# Patient Record
Sex: Female | Born: 1935 | Race: Black or African American | Hispanic: No | Smoking: Never smoker
Health system: Southern US, Community
[De-identification: ages and names within clinical notes are randomized; demographics above are authoritative.]

## PROBLEM LIST (undated history)

## (undated) DIAGNOSIS — R413 Other amnesia: Secondary | ICD-10-CM

## (undated) DIAGNOSIS — I5030 Unspecified diastolic (congestive) heart failure: Secondary | ICD-10-CM

## (undated) DIAGNOSIS — D509 Iron deficiency anemia, unspecified: Secondary | ICD-10-CM

## (undated) DIAGNOSIS — I739 Peripheral vascular disease, unspecified: Secondary | ICD-10-CM

## (undated) DIAGNOSIS — E785 Hyperlipidemia, unspecified: Secondary | ICD-10-CM

## (undated) DIAGNOSIS — I10 Essential (primary) hypertension: Secondary | ICD-10-CM

## (undated) DIAGNOSIS — I482 Chronic atrial fibrillation, unspecified: Secondary | ICD-10-CM

## (undated) DIAGNOSIS — I639 Cerebral infarction, unspecified: Secondary | ICD-10-CM

## (undated) DIAGNOSIS — E669 Obesity, unspecified: Secondary | ICD-10-CM

## (undated) DIAGNOSIS — I2510 Atherosclerotic heart disease of native coronary artery without angina pectoris: Secondary | ICD-10-CM

## (undated) HISTORY — DX: Unspecified diastolic (congestive) heart failure: I50.30

## (undated) HISTORY — PX: EMBOLECTOMY: SHX44

## (undated) HISTORY — DX: Iron deficiency anemia, unspecified: D50.9

## (undated) HISTORY — DX: Essential (primary) hypertension: I10

## (undated) HISTORY — DX: Cerebral infarction, unspecified: I63.9

## (undated) HISTORY — DX: Other amnesia: R41.3

## (undated) HISTORY — DX: Hyperlipidemia, unspecified: E78.5

## (undated) HISTORY — DX: Obesity, unspecified: E66.9

---

## 1998-12-11 ENCOUNTER — Encounter: Payer: Self-pay | Admitting: Emergency Medicine

## 1998-12-11 ENCOUNTER — Emergency Department (HOSPITAL_COMMUNITY): Admission: EM | Admit: 1998-12-11 | Discharge: 1998-12-11 | Payer: Self-pay | Admitting: Emergency Medicine

## 1999-04-11 ENCOUNTER — Emergency Department (HOSPITAL_COMMUNITY): Admission: EM | Admit: 1999-04-11 | Discharge: 1999-04-11 | Payer: Self-pay | Admitting: Emergency Medicine

## 1999-04-16 ENCOUNTER — Encounter: Admission: RE | Admit: 1999-04-16 | Discharge: 1999-04-16 | Payer: Self-pay | Admitting: Internal Medicine

## 1999-05-01 ENCOUNTER — Encounter: Admission: RE | Admit: 1999-05-01 | Discharge: 1999-05-01 | Payer: Self-pay | Admitting: Hematology and Oncology

## 1999-06-13 ENCOUNTER — Encounter: Admission: RE | Admit: 1999-06-13 | Discharge: 1999-06-13 | Payer: Self-pay | Admitting: Internal Medicine

## 1999-12-21 ENCOUNTER — Emergency Department (HOSPITAL_COMMUNITY): Admission: EM | Admit: 1999-12-21 | Discharge: 1999-12-21 | Payer: Self-pay | Admitting: Emergency Medicine

## 1999-12-21 ENCOUNTER — Encounter: Payer: Self-pay | Admitting: Emergency Medicine

## 2000-04-11 ENCOUNTER — Encounter: Payer: Self-pay | Admitting: Emergency Medicine

## 2000-04-11 ENCOUNTER — Emergency Department (HOSPITAL_COMMUNITY): Admission: EM | Admit: 2000-04-11 | Discharge: 2000-04-11 | Payer: Self-pay | Admitting: Emergency Medicine

## 2000-04-23 ENCOUNTER — Encounter: Admission: RE | Admit: 2000-04-23 | Discharge: 2000-04-23 | Payer: Self-pay | Admitting: Hematology and Oncology

## 2000-05-28 ENCOUNTER — Encounter: Admission: RE | Admit: 2000-05-28 | Discharge: 2000-05-28 | Payer: Self-pay | Admitting: Hematology and Oncology

## 2000-06-11 ENCOUNTER — Encounter: Admission: RE | Admit: 2000-06-11 | Discharge: 2000-06-11 | Payer: Self-pay | Admitting: Hematology and Oncology

## 2000-07-09 ENCOUNTER — Encounter: Admission: RE | Admit: 2000-07-09 | Discharge: 2000-07-09 | Payer: Self-pay | Admitting: Hematology and Oncology

## 2000-07-16 ENCOUNTER — Encounter: Admission: RE | Admit: 2000-07-16 | Discharge: 2000-07-16 | Payer: Self-pay | Admitting: Hematology and Oncology

## 2000-07-30 ENCOUNTER — Encounter: Admission: RE | Admit: 2000-07-30 | Discharge: 2000-07-30 | Payer: Self-pay | Admitting: Internal Medicine

## 2005-08-23 ENCOUNTER — Ambulatory Visit: Payer: Self-pay | Admitting: Cardiology

## 2005-08-23 ENCOUNTER — Inpatient Hospital Stay (HOSPITAL_COMMUNITY): Admission: EM | Admit: 2005-08-23 | Discharge: 2005-09-03 | Payer: Self-pay | Admitting: Emergency Medicine

## 2005-08-23 ENCOUNTER — Encounter: Payer: Self-pay | Admitting: Internal Medicine

## 2005-10-22 ENCOUNTER — Inpatient Hospital Stay (HOSPITAL_COMMUNITY): Admission: EM | Admit: 2005-10-22 | Discharge: 2005-10-24 | Payer: Self-pay | Admitting: Emergency Medicine

## 2005-10-22 ENCOUNTER — Ambulatory Visit: Payer: Self-pay | Admitting: Family Medicine

## 2005-10-25 ENCOUNTER — Ambulatory Visit: Payer: Self-pay | Admitting: Family Medicine

## 2005-11-08 ENCOUNTER — Ambulatory Visit: Payer: Self-pay | Admitting: Family Medicine

## 2005-11-15 ENCOUNTER — Ambulatory Visit: Payer: Self-pay | Admitting: Family Medicine

## 2005-12-02 ENCOUNTER — Ambulatory Visit: Payer: Self-pay | Admitting: Family Medicine

## 2005-12-12 ENCOUNTER — Ambulatory Visit: Payer: Self-pay | Admitting: Family Medicine

## 2005-12-19 ENCOUNTER — Ambulatory Visit: Payer: Self-pay | Admitting: Family Medicine

## 2006-04-10 ENCOUNTER — Ambulatory Visit: Payer: Self-pay | Admitting: Family Medicine

## 2006-05-06 ENCOUNTER — Encounter (INDEPENDENT_AMBULATORY_CARE_PROVIDER_SITE_OTHER): Payer: Self-pay | Admitting: Cardiology

## 2006-05-06 ENCOUNTER — Inpatient Hospital Stay (HOSPITAL_COMMUNITY): Admission: EM | Admit: 2006-05-06 | Discharge: 2006-05-13 | Payer: Self-pay | Admitting: Emergency Medicine

## 2006-05-06 ENCOUNTER — Encounter (INDEPENDENT_AMBULATORY_CARE_PROVIDER_SITE_OTHER): Payer: Self-pay | Admitting: *Deleted

## 2006-07-11 ENCOUNTER — Ambulatory Visit: Payer: Self-pay | Admitting: Family Medicine

## 2006-08-04 ENCOUNTER — Ambulatory Visit: Payer: Self-pay | Admitting: Family Medicine

## 2006-09-04 ENCOUNTER — Ambulatory Visit: Payer: Self-pay | Admitting: Family Medicine

## 2006-10-20 ENCOUNTER — Ambulatory Visit: Payer: Self-pay | Admitting: Internal Medicine

## 2006-11-03 ENCOUNTER — Ambulatory Visit: Payer: Self-pay | Admitting: Family Medicine

## 2006-12-01 ENCOUNTER — Ambulatory Visit: Payer: Self-pay | Admitting: Family Medicine

## 2006-12-09 ENCOUNTER — Ambulatory Visit: Payer: Self-pay | Admitting: Family Medicine

## 2006-12-23 ENCOUNTER — Ambulatory Visit: Payer: Self-pay | Admitting: Family Medicine

## 2007-01-20 ENCOUNTER — Ambulatory Visit: Payer: Self-pay | Admitting: Family Medicine

## 2007-02-19 ENCOUNTER — Ambulatory Visit: Payer: Self-pay | Admitting: Internal Medicine

## 2007-03-23 ENCOUNTER — Ambulatory Visit: Payer: Self-pay | Admitting: Family Medicine

## 2007-04-20 ENCOUNTER — Ambulatory Visit: Payer: Self-pay | Admitting: Family Medicine

## 2007-05-18 ENCOUNTER — Ambulatory Visit: Payer: Self-pay | Admitting: Internal Medicine

## 2007-06-02 DIAGNOSIS — I10 Essential (primary) hypertension: Secondary | ICD-10-CM

## 2007-06-15 ENCOUNTER — Ambulatory Visit: Payer: Self-pay | Admitting: Internal Medicine

## 2007-06-29 ENCOUNTER — Ambulatory Visit: Payer: Self-pay | Admitting: Internal Medicine

## 2007-07-13 ENCOUNTER — Ambulatory Visit: Payer: Self-pay | Admitting: Family Medicine

## 2007-08-10 ENCOUNTER — Ambulatory Visit: Payer: Self-pay | Admitting: Nurse Practitioner

## 2007-09-07 ENCOUNTER — Ambulatory Visit: Payer: Self-pay | Admitting: Nurse Practitioner

## 2007-10-06 ENCOUNTER — Ambulatory Visit: Payer: Self-pay | Admitting: Internal Medicine

## 2007-11-03 ENCOUNTER — Ambulatory Visit: Payer: Self-pay | Admitting: Internal Medicine

## 2007-11-17 ENCOUNTER — Ambulatory Visit: Payer: Self-pay | Admitting: Internal Medicine

## 2007-12-15 ENCOUNTER — Ambulatory Visit: Payer: Self-pay | Admitting: Family Medicine

## 2008-01-13 ENCOUNTER — Ambulatory Visit: Payer: Self-pay | Admitting: Family Medicine

## 2008-01-13 ENCOUNTER — Ambulatory Visit: Payer: Self-pay | Admitting: Internal Medicine

## 2008-02-10 ENCOUNTER — Ambulatory Visit: Payer: Self-pay | Admitting: Internal Medicine

## 2008-02-19 ENCOUNTER — Ambulatory Visit: Payer: Self-pay | Admitting: Internal Medicine

## 2008-03-10 ENCOUNTER — Ambulatory Visit: Payer: Self-pay | Admitting: Internal Medicine

## 2008-04-07 ENCOUNTER — Ambulatory Visit: Payer: Self-pay | Admitting: Family Medicine

## 2008-04-07 LAB — CONVERTED CEMR LAB
INR: 2.2 — ABNORMAL HIGH (ref 0.0–1.5)
Prothrombin Time: 25.8 s — ABNORMAL HIGH (ref 11.6–15.2)

## 2008-05-05 ENCOUNTER — Ambulatory Visit: Payer: Self-pay | Admitting: Internal Medicine

## 2008-05-05 LAB — CONVERTED CEMR LAB
INR: 2.4 — ABNORMAL HIGH (ref 0.0–1.5)
Prothrombin Time: 27.4 s — ABNORMAL HIGH (ref 11.6–15.2)

## 2008-06-02 ENCOUNTER — Ambulatory Visit: Payer: Self-pay | Admitting: Family Medicine

## 2008-06-02 LAB — CONVERTED CEMR LAB
INR: 2 — ABNORMAL HIGH (ref 0.0–1.5)
Prothrombin Time: 24.1 s — ABNORMAL HIGH (ref 11.6–15.2)

## 2008-06-30 ENCOUNTER — Encounter (INDEPENDENT_AMBULATORY_CARE_PROVIDER_SITE_OTHER): Payer: Self-pay | Admitting: Family Medicine

## 2008-06-30 ENCOUNTER — Ambulatory Visit: Payer: Self-pay

## 2008-06-30 ENCOUNTER — Ambulatory Visit: Payer: Self-pay | Admitting: Family Medicine

## 2008-06-30 LAB — CONVERTED CEMR LAB
INR: 2.2 — ABNORMAL HIGH (ref 0.0–1.5)
Prothrombin Time: 25.6 s — ABNORMAL HIGH (ref 11.6–15.2)

## 2008-07-28 ENCOUNTER — Ambulatory Visit: Payer: Self-pay | Admitting: Family Medicine

## 2008-07-28 ENCOUNTER — Encounter (INDEPENDENT_AMBULATORY_CARE_PROVIDER_SITE_OTHER): Payer: Self-pay | Admitting: Adult Health

## 2008-07-28 ENCOUNTER — Emergency Department (HOSPITAL_COMMUNITY): Admission: EM | Admit: 2008-07-28 | Discharge: 2008-07-28 | Payer: Self-pay | Admitting: Emergency Medicine

## 2008-07-28 LAB — CONVERTED CEMR LAB
INR: 2.6 — ABNORMAL HIGH (ref 0.0–1.5)
Prothrombin Time: 30.1 s — ABNORMAL HIGH (ref 11.6–15.2)

## 2008-08-30 ENCOUNTER — Ambulatory Visit: Payer: Self-pay | Admitting: Internal Medicine

## 2008-08-30 ENCOUNTER — Encounter: Payer: Self-pay | Admitting: Cardiology

## 2008-08-30 ENCOUNTER — Encounter (INDEPENDENT_AMBULATORY_CARE_PROVIDER_SITE_OTHER): Payer: Self-pay | Admitting: Adult Health

## 2008-08-30 LAB — CONVERTED CEMR LAB
ALT: 16 units/L (ref 0–35)
AST: 22 units/L (ref 0–37)
Albumin: 3.9 g/dL (ref 3.5–5.2)
Alkaline Phosphatase: 62 units/L (ref 39–117)
BUN: 15 mg/dL (ref 6–23)
Basophils Absolute: 0 10*3/uL (ref 0.0–0.1)
Basophils Relative: 1 % (ref 0–1)
CO2: 26 meq/L (ref 19–32)
Calcium: 9 mg/dL (ref 8.4–10.5)
Chloride: 102 meq/L (ref 96–112)
Cholesterol: 207 mg/dL — ABNORMAL HIGH (ref 0–200)
Creatinine, Ser: 1.08 mg/dL (ref 0.40–1.20)
Eosinophils Absolute: 0.1 10*3/uL (ref 0.0–0.7)
Eosinophils Relative: 1 % (ref 0–5)
Glucose, Bld: 89 mg/dL (ref 70–99)
HCT: 44.9 % (ref 36.0–46.0)
HDL: 46 mg/dL (ref >39)
Hemoglobin: 14.2 g/dL (ref 12.0–15.0)
INR: 3.5 — ABNORMAL HIGH (ref 0.0–1.5)
LDL Cholesterol: 142 mg/dL — ABNORMAL HIGH (ref 0–99)
Lymphocytes Relative: 54 % — ABNORMAL HIGH (ref 12–46)
Lymphs Abs: 2.6 10*3/uL (ref 0.7–4.0)
MCHC: 31.6 g/dL (ref 30.0–36.0)
MCV: 91.1 fL (ref 78.0–100.0)
Monocytes Absolute: 0.3 10*3/uL (ref 0.1–1.0)
Monocytes Relative: 6 % (ref 3–12)
Neutro Abs: 1.8 10*3/uL (ref 1.7–7.7)
Neutrophils Relative %: 38 % — ABNORMAL LOW (ref 43–77)
Platelets: 243 10*3/uL (ref 150–400)
Potassium: 3.6 meq/L (ref 3.5–5.3)
Pro B Natriuretic peptide (BNP): 275.3 pg/mL — ABNORMAL HIGH (ref 0.0–100.0)
Prothrombin Time: 38.1 s — ABNORMAL HIGH (ref 11.6–15.2)
RBC: 4.93 M/uL (ref 3.87–5.11)
RDW: 13 % (ref 11.5–15.5)
Sodium: 140 meq/L (ref 135–145)
TSH: 0.603 microintl units/mL (ref 0.350–4.50)
Total Bilirubin: 0.6 mg/dL (ref 0.3–1.2)
Total CHOL/HDL Ratio: 4.5
Total Protein: 7.5 g/dL (ref 6.0–8.3)
Triglycerides: 96 mg/dL (ref <150)
VLDL: 19 mg/dL (ref 0–40)
WBC: 4.7 10*3/uL (ref 4.0–10.5)

## 2008-09-22 ENCOUNTER — Ambulatory Visit: Payer: Self-pay | Admitting: Adult Health

## 2008-10-10 ENCOUNTER — Emergency Department (HOSPITAL_COMMUNITY): Admission: EM | Admit: 2008-10-10 | Discharge: 2008-10-10 | Payer: Self-pay | Admitting: Emergency Medicine

## 2008-10-10 ENCOUNTER — Ambulatory Visit: Payer: Self-pay | Admitting: Internal Medicine

## 2008-10-20 ENCOUNTER — Ambulatory Visit: Payer: Self-pay | Admitting: Internal Medicine

## 2008-11-03 ENCOUNTER — Ambulatory Visit: Payer: Self-pay | Admitting: Adult Health

## 2008-11-17 ENCOUNTER — Ambulatory Visit: Payer: Self-pay | Admitting: Adult Health

## 2008-12-15 ENCOUNTER — Ambulatory Visit: Payer: Self-pay | Admitting: Adult Health

## 2008-12-20 ENCOUNTER — Encounter (INDEPENDENT_AMBULATORY_CARE_PROVIDER_SITE_OTHER): Payer: Self-pay | Admitting: Adult Health

## 2008-12-20 ENCOUNTER — Ambulatory Visit: Payer: Self-pay | Admitting: Family Medicine

## 2008-12-20 LAB — CONVERTED CEMR LAB
ALT: 13 units/L (ref 0–35)
AST: 20 units/L (ref 0–37)
Albumin: 4.1 g/dL (ref 3.5–5.2)
Alkaline Phosphatase: 72 units/L (ref 39–117)
BUN: 29 mg/dL — ABNORMAL HIGH (ref 6–23)
CO2: 25 meq/L (ref 19–32)
Calcium: 10 mg/dL (ref 8.4–10.5)
Chloride: 100 meq/L (ref 96–112)
Cholesterol: 262 mg/dL — ABNORMAL HIGH (ref 0–200)
Creatinine, Ser: 1.19 mg/dL (ref 0.40–1.20)
Glucose, Bld: 88 mg/dL (ref 70–99)
HDL: 51 mg/dL (ref >39)
LDL Cholesterol: 193 mg/dL — ABNORMAL HIGH (ref 0–99)
Potassium: 3.6 meq/L (ref 3.5–5.3)
Pro B Natriuretic peptide (BNP): 220.4 pg/mL — ABNORMAL HIGH (ref 0.0–100.0)
Sodium: 139 meq/L (ref 135–145)
Total Bilirubin: 0.7 mg/dL (ref 0.3–1.2)
Total CHOL/HDL Ratio: 5.1
Total Protein: 8.7 g/dL — ABNORMAL HIGH (ref 6.0–8.3)
Triglycerides: 90 mg/dL (ref <150)
VLDL: 18 mg/dL (ref 0–40)

## 2009-01-12 ENCOUNTER — Ambulatory Visit: Payer: Self-pay | Admitting: Adult Health

## 2009-02-09 ENCOUNTER — Ambulatory Visit: Payer: Self-pay | Admitting: Adult Health

## 2009-03-08 ENCOUNTER — Ambulatory Visit: Payer: Self-pay | Admitting: Family Medicine

## 2009-04-05 ENCOUNTER — Ambulatory Visit: Payer: Self-pay | Admitting: Internal Medicine

## 2009-04-17 ENCOUNTER — Ambulatory Visit: Payer: Self-pay | Admitting: Internal Medicine

## 2009-05-03 ENCOUNTER — Ambulatory Visit: Payer: Self-pay | Admitting: Adult Health

## 2009-05-03 ENCOUNTER — Ambulatory Visit: Payer: Self-pay | Admitting: Family Medicine

## 2009-05-17 ENCOUNTER — Encounter (INDEPENDENT_AMBULATORY_CARE_PROVIDER_SITE_OTHER): Payer: Self-pay | Admitting: Adult Health

## 2009-05-17 ENCOUNTER — Ambulatory Visit: Payer: Self-pay | Admitting: Internal Medicine

## 2009-05-17 LAB — CONVERTED CEMR LAB
ALT: 18 units/L (ref 0–35)
AST: 19 units/L (ref 0–37)
Albumin: 3.6 g/dL (ref 3.5–5.2)
Alkaline Phosphatase: 72 units/L (ref 39–117)
BUN: 17 mg/dL (ref 6–23)
CO2: 25 meq/L (ref 19–32)
Calcium: 9.2 mg/dL (ref 8.4–10.5)
Chloride: 105 meq/L (ref 96–112)
Cholesterol: 228 mg/dL — ABNORMAL HIGH (ref 0–200)
Creatinine, Ser: 0.97 mg/dL (ref 0.40–1.20)
Glucose, Bld: 83 mg/dL (ref 70–99)
HDL: 48 mg/dL (ref >39)
LDL Cholesterol: 165 mg/dL — ABNORMAL HIGH (ref 0–99)
Microalb, Ur: 21.49 mg/dL — ABNORMAL HIGH (ref 0.00–1.89)
Potassium: 4 meq/L (ref 3.5–5.3)
Pro B Natriuretic peptide (BNP): 210.8 pg/mL — ABNORMAL HIGH (ref 0.0–100.0)
Sodium: 141 meq/L (ref 135–145)
Total Bilirubin: 0.7 mg/dL (ref 0.3–1.2)
Total CHOL/HDL Ratio: 4.8
Total Protein: 7.6 g/dL (ref 6.0–8.3)
Triglycerides: 77 mg/dL (ref <150)
VLDL: 15 mg/dL (ref 0–40)

## 2009-05-18 ENCOUNTER — Encounter: Payer: Self-pay | Admitting: Cardiology

## 2009-05-31 ENCOUNTER — Ambulatory Visit: Payer: Self-pay | Admitting: Family Medicine

## 2009-06-16 ENCOUNTER — Ambulatory Visit: Payer: Self-pay | Admitting: Internal Medicine

## 2009-06-19 DIAGNOSIS — D649 Anemia, unspecified: Secondary | ICD-10-CM | POA: Insufficient documentation

## 2009-06-28 ENCOUNTER — Ambulatory Visit: Payer: Self-pay | Admitting: Family Medicine

## 2009-06-28 ENCOUNTER — Ambulatory Visit: Payer: Self-pay | Admitting: Internal Medicine

## 2009-07-26 ENCOUNTER — Ambulatory Visit: Payer: Self-pay | Admitting: Adult Health

## 2009-08-16 ENCOUNTER — Ambulatory Visit: Payer: Self-pay | Admitting: Adult Health

## 2009-09-13 ENCOUNTER — Ambulatory Visit: Payer: Self-pay | Admitting: Adult Health

## 2009-10-12 ENCOUNTER — Ambulatory Visit: Payer: Self-pay | Admitting: Adult Health

## 2009-11-01 ENCOUNTER — Ambulatory Visit: Payer: Self-pay | Admitting: Internal Medicine

## 2009-11-01 ENCOUNTER — Encounter (INDEPENDENT_AMBULATORY_CARE_PROVIDER_SITE_OTHER): Payer: Self-pay | Admitting: Adult Health

## 2009-11-01 LAB — CONVERTED CEMR LAB
ALT: 11 units/L (ref 0–35)
AST: 20 units/L (ref 0–37)
Albumin: 3.7 g/dL (ref 3.5–5.2)
Alkaline Phosphatase: 92 units/L (ref 39–117)
BUN: 19 mg/dL (ref 6–23)
CO2: 25 meq/L (ref 19–32)
Calcium: 9.9 mg/dL (ref 8.4–10.5)
Chloride: 101 meq/L (ref 96–112)
Cholesterol: 163 mg/dL (ref 0–200)
Creatinine, Ser: 1.09 mg/dL (ref 0.40–1.20)
Glucose, Bld: 84 mg/dL (ref 70–99)
HDL: 49 mg/dL (ref >39)
LDL Cholesterol: 99 mg/dL (ref 0–99)
Potassium: 4 meq/L (ref 3.5–5.3)
Sodium: 141 meq/L (ref 135–145)
Total Bilirubin: 0.8 mg/dL (ref 0.3–1.2)
Total CHOL/HDL Ratio: 3.3
Total Protein: 7.9 g/dL (ref 6.0–8.3)
Triglycerides: 76 mg/dL (ref <150)
VLDL: 15 mg/dL (ref 0–40)

## 2009-11-29 ENCOUNTER — Ambulatory Visit: Payer: Self-pay | Admitting: Adult Health

## 2009-12-27 ENCOUNTER — Ambulatory Visit: Payer: Self-pay | Admitting: Adult Health

## 2010-01-25 ENCOUNTER — Ambulatory Visit: Payer: Self-pay | Admitting: Adult Health

## 2010-03-05 ENCOUNTER — Ambulatory Visit: Payer: Self-pay | Admitting: Family Medicine

## 2010-03-05 ENCOUNTER — Encounter: Payer: Self-pay | Admitting: Cardiology

## 2010-03-06 ENCOUNTER — Encounter: Payer: Self-pay | Admitting: Cardiology

## 2010-03-19 ENCOUNTER — Ambulatory Visit: Payer: Self-pay | Admitting: Family Medicine

## 2010-03-29 ENCOUNTER — Ambulatory Visit: Payer: Self-pay | Admitting: Cardiology

## 2010-04-02 ENCOUNTER — Ambulatory Visit: Payer: Self-pay | Admitting: Adult Health

## 2010-04-02 LAB — CONVERTED CEMR LAB
BUN: 17 mg/dL (ref 6–23)
CO2: 29 meq/L (ref 19–32)
Calcium: 9.1 mg/dL (ref 8.4–10.5)
Chloride: 104 meq/L (ref 96–112)
Creatinine, Ser: 0.9 mg/dL (ref 0.4–1.2)
GFR calc non Af Amer: 83.75 mL/min (ref >60)
Glucose, Bld: 87 mg/dL (ref 70–99)
Potassium: 4 meq/L (ref 3.5–5.1)
Pro B Natriuretic peptide (BNP): 377.7 pg/mL — ABNORMAL HIGH (ref 0.0–100.0)
Sodium: 140 meq/L (ref 135–145)

## 2010-04-12 ENCOUNTER — Ambulatory Visit: Payer: Self-pay

## 2010-04-12 ENCOUNTER — Encounter: Payer: Self-pay | Admitting: Internal Medicine

## 2010-04-12 ENCOUNTER — Ambulatory Visit (HOSPITAL_COMMUNITY): Admission: RE | Admit: 2010-04-12 | Discharge: 2010-04-12 | Payer: Self-pay | Admitting: Internal Medicine

## 2010-04-12 ENCOUNTER — Ambulatory Visit: Payer: Self-pay | Admitting: Cardiology

## 2010-04-30 ENCOUNTER — Ambulatory Visit: Payer: Self-pay | Admitting: Internal Medicine

## 2010-05-17 ENCOUNTER — Ambulatory Visit: Payer: Self-pay | Admitting: Cardiology

## 2010-05-21 LAB — CONVERTED CEMR LAB
BUN: 16 mg/dL (ref 6–23)
CO2: 28 meq/L (ref 19–32)
Calcium: 9.8 mg/dL (ref 8.4–10.5)
Chloride: 103 meq/L (ref 96–112)
Creatinine, Ser: 1.1 mg/dL (ref 0.4–1.2)
GFR calc non Af Amer: 60.9 mL/min (ref >60)
Glucose, Bld: 86 mg/dL (ref 70–99)
Potassium: 4 meq/L (ref 3.5–5.1)
Pro B Natriuretic peptide (BNP): 211.9 pg/mL — ABNORMAL HIGH (ref 0.0–100.0)
Sodium: 139 meq/L (ref 135–145)

## 2010-05-30 ENCOUNTER — Telehealth (INDEPENDENT_AMBULATORY_CARE_PROVIDER_SITE_OTHER): Payer: Self-pay | Admitting: *Deleted

## 2010-05-31 ENCOUNTER — Ambulatory Visit: Payer: Self-pay | Admitting: Cardiology

## 2010-05-31 ENCOUNTER — Encounter: Payer: Self-pay | Admitting: Cardiology

## 2010-05-31 ENCOUNTER — Encounter (HOSPITAL_COMMUNITY): Admission: RE | Admit: 2010-05-31 | Discharge: 2010-07-24 | Payer: Self-pay | Admitting: Cardiovascular Disease

## 2010-05-31 ENCOUNTER — Ambulatory Visit: Payer: Self-pay

## 2010-06-04 LAB — CONVERTED CEMR LAB
BUN: 14 mg/dL (ref 6–23)
CO2: 30 meq/L (ref 19–32)
Calcium: 9.5 mg/dL (ref 8.4–10.5)
Chloride: 104 meq/L (ref 96–112)
Creatinine, Ser: 1 mg/dL (ref 0.4–1.2)
GFR calc non Af Amer: 70.21 mL/min (ref >60)
Glucose, Bld: 71 mg/dL (ref 70–99)
Potassium: 4.3 meq/L (ref 3.5–5.1)
Sodium: 141 meq/L (ref 135–145)

## 2010-07-05 ENCOUNTER — Ambulatory Visit: Payer: Self-pay | Admitting: Cardiology

## 2010-07-23 ENCOUNTER — Ambulatory Visit: Payer: Self-pay | Admitting: Cardiology

## 2010-07-23 DIAGNOSIS — I5032 Chronic diastolic (congestive) heart failure: Secondary | ICD-10-CM

## 2010-07-26 ENCOUNTER — Ambulatory Visit: Payer: Self-pay | Admitting: Cardiology

## 2010-08-02 ENCOUNTER — Telehealth: Payer: Self-pay | Admitting: Cardiology

## 2010-08-03 ENCOUNTER — Telehealth (INDEPENDENT_AMBULATORY_CARE_PROVIDER_SITE_OTHER): Payer: Self-pay | Admitting: *Deleted

## 2010-08-09 ENCOUNTER — Ambulatory Visit: Payer: Self-pay | Admitting: Cardiology

## 2010-08-13 LAB — CONVERTED CEMR LAB
ALT: 17 units/L (ref 0–35)
AST: 24 units/L (ref 0–37)
Albumin: 3.5 g/dL (ref 3.5–5.2)
Alkaline Phosphatase: 87 units/L (ref 39–117)
BUN: 18 mg/dL (ref 6–23)
Bilirubin, Direct: 0.1 mg/dL (ref 0.0–0.3)
CO2: 32 meq/L (ref 19–32)
CO2: 32 meq/L (ref 19–32)
Calcium: 9 mg/dL (ref 8.4–10.5)
Chloride: 96 meq/L (ref 96–112)
Chloride: 101 meq/L (ref 96–112)
Cholesterol: 201 mg/dL — ABNORMAL HIGH (ref 0–200)
Creatinine, Ser: 1.1 mg/dL (ref 0.4–1.2)
Direct LDL: 142.7 mg/dL
GFR calc non Af Amer: 59.64 mL/min (ref >60)
Glucose, Bld: 73 mg/dL (ref 70–99)
HDL: 41.6 mg/dL (ref >39.00)
Potassium: 4.1 meq/L (ref 3.5–5.1)
Sodium: 140 meq/L (ref 135–145)
Sodium: 141 meq/L (ref 135–145)
Total Bilirubin: 1.1 mg/dL (ref 0.3–1.2)
Total CHOL/HDL Ratio: 5
Total Protein: 7.5 g/dL (ref 6.0–8.3)
Triglycerides: 90 mg/dL (ref 0.0–149.0)
VLDL: 18 mg/dL (ref 0.0–40.0)

## 2010-09-03 ENCOUNTER — Ambulatory Visit: Payer: Self-pay | Admitting: Cardiology

## 2010-09-03 ENCOUNTER — Encounter: Payer: Self-pay | Admitting: Cardiology

## 2010-09-14 ENCOUNTER — Telehealth: Payer: Self-pay | Admitting: Cardiology

## 2010-09-18 ENCOUNTER — Ambulatory Visit: Payer: Self-pay | Admitting: Cardiology

## 2010-09-19 LAB — CONVERTED CEMR LAB
ALT: 17 units/L (ref 0–35)
Alkaline Phosphatase: 82 units/L (ref 39–117)
Bilirubin, Direct: 0.1 mg/dL (ref 0.0–0.3)
CO2: 31 meq/L (ref 19–32)
Chloride: 99 meq/L (ref 96–112)
Glucose, Bld: 80 mg/dL (ref 70–99)
Potassium: 4.6 meq/L (ref 3.5–5.1)
Sodium: 137 meq/L (ref 135–145)
Total CHOL/HDL Ratio: 4
Total Protein: 7.3 g/dL (ref 6.0–8.3)

## 2010-10-09 ENCOUNTER — Ambulatory Visit
Admission: RE | Admit: 2010-10-09 | Discharge: 2010-10-09 | Payer: Self-pay | Source: Home / Self Care | Attending: Cardiology | Admitting: Cardiology

## 2010-10-09 ENCOUNTER — Encounter: Payer: Self-pay | Admitting: Cardiology

## 2010-10-12 ENCOUNTER — Telehealth: Payer: Self-pay | Admitting: Cardiology

## 2010-10-22 ENCOUNTER — Ambulatory Visit: Admit: 2010-10-22 | Payer: Self-pay | Admitting: Cardiology

## 2010-10-25 NOTE — Progress Notes (Signed)
Summary: Nuc Pre-Procedure  Phone Note Outgoing Call Call back at [facility] Phone [phone]   Call placed by: Antionette Char RN,  May 30, 2010 3:16 PM Call placed to: Patient Reason for Call: Confirm/change Appt Summary of Call: Reviewed information on Myoview Information Sheet (see scanned document for further details).  Spoke with patient.     Nuclear Med Background Indications for Stress Test: Evaluation for Ischemia   History: Echo  History Comments: 7/11 Moderate LVH  Symptoms: DOE    Nuclear Pre-Procedure Cardiac Risk Factors: CVA, Hypertension, Lipids Height (in): 64

## 2010-10-25 NOTE — Assessment & Plan Note (Signed)
Summary: PER CHECK OUT/SF   Primary [facility] [place]:  Dr. Rudean Curt   History of Present Illness: 77 yo with history of atrial fibrillation, diastolic CHF, and cardioembolic events to the right kidney, brain, and right arm returns for followup of exertional shortness of breath.  Echo showed moderate LVH, EF 60-65%, and severe diastolic dysfunction with moderate biatrial enlargement.  [name] showed a possible small anterior MI with mild peri-infarct ischemia versus breast artifact.   Patient is actually doing well.  She has only occasional heart burn-type  chest pain that occurs after meals.  This resolves with tums.  No exertional chest pain.  She has no orthopnea or PND and is able to walk about 4 blocks at a time.  BP is high today at 160/90 (better than before).  She is taking most of her meds but never started spironolactone.    Labs (2/11): K 4, creatinine 1.09, HDL 49, LDL 99 Labs (7/11): K 4, creatinine 0.9, BNP 378 Labs (8/11): BNP 212 Labs (9/11): K 4.3, creatinine 1.0 Labs (10/11): K 4.1, creatinine 1.1, LDL 143, HDL 42  Current Medications (verified): 1)  Warfarin Sodium 4 Mg  Tabs (Warfarin Sodium) .... One Daily 2)  Norvasc 10 Mg Tabs (Amlodipine Besylate) .... One Daily 3)  Lisinopril 20 Mg Tabs (Lisinopril) .... One Daily 4)  Clonidine Hcl 0.2 Mg Tabs (Clonidine Hcl) .Marland Kitchen.. 1 By Mouth Two Times A Day 5)  Simvastatin 20 Mg Tabs (Simvastatin) .... Take One Tablet At Bedtime 6)  Coreg 12.5 Mg Tabs (Carvedilol) .... One Twice A Day 7)  Furosemide 40 Mg Tabs (Furosemide) .... One Daily 8)  Klor-Con M20 20 Meq Cr-Tabs (Potassium Chloride Crys Cr) .... One Daily 9)  Spironolactone 25 Mg Tabs (Spironolactone) .... One-Half Tablet Daily (On Hold Pt Dose Not Know If She Is Still Taking) 10)  Aspirin 81 Mg Tbec (Aspirin) .... One Daily  Allergies: No Known Drug Allergies  Past History:  Past Medical History: Reviewed history from 07/05/2010 and no changes required. 1.  Resistant HTN. Patient states that this has been poorly controlled for years.  2. Diastolic CHF:  Echo (7/11): EF 60-65%, moderate LVH, severe diastolic dysfunction.    3. Iron deficiency anemia. 4. Atrial fibrillation: Chronic. Has had cardioembolic right renal infarct in 12/06 and cardioembolic event to the right arm requiring right brachial embolectomy.  Both events occurred while subtherapeutic on coumadin.  5. Obese.  6. Left frontal embolic cerebrovascular accident. 7. Dyslipidemia. 8. Lexiscan myoview (9/11): ef 76%, normal wall motion, possible small anterior MI with mild peri-infarct ischemia but cannot rule out breast attenuation.   Family History: Reviewed history from 05/16/2010 and no changes required. Father died at 48 but she is not sure why.  Multiple family members with resistant HTN.   Social History: Reviewed history from 03/29/2010 and no changes required. She is widowed.  She has three living children.  She has one daughter who lives here [place].  She does not use tobacco or drink ETOH.    Review of Systems       All systems reviewed and negative except as per HPI.   Vital Signs:  Patient profile:   77 year old female Height:      64 inches Weight:      216 pounds Pulse rate:   100 / minute Resp:     14 per minute BP sitting:   160 / 90  (right arm)  Vitals Entered By: Ellender Hose RN (July 26, 2010 10:35 AM) Is Patient Diabetic? No Comments pt. having difficulty with bowel movements.   Physical Exam  General:  Obese elderly female in no apparent distress Neck:  Neck supple, no JVD. No masses, thyromegaly or abnormal cervical nodes. Lungs:  Mildly decreased breath sounds bilaterally.  Heart:  Non-displaced PMI, chest non-tender; irregular rate and rhythm, S1, S2 without rubs or gallops. 2/6 systolic murmur along the sternal border.  Carotid upstroke normal, no bruit. Pedals normal pulses. No edema.  Abdomen:  Bowel sounds positive; abdomen soft  and non-tender without masses, organomegaly, or hernias noted. No hepatosplenomegaly. Extremities:  No clubbing or cyanosis. Neurologic:  Alert and oriented x 3. Psych:  Normal affect.   Impression & Recommendations:  Problem # 1:  CHRONIC DIASTOLIC HEART FAILURE (ICD-428.32) Stable NYHA class III symptoms.  She does appear close to euvolemic.  Continue current Lasix dosing, will need to work on BP control.   Problem # 2:  UNSPECIFIED ESSENTIAL HYPERTENSION (ICD-401.9) BP still high but improved.  She never started spironolactone but is taking her other meds. Rather than starting spironolactone, I will have her increase lisinopril to 40 mg daily and Coreg to 18.75 mg two times a day.  BMET in 2 wks.   Problem # 3:  DYSLIPIDEMIA (ICD-272.4) Given possible CAD, would like lower LDL.  Will increase simvastatin to 40 mg daily.   Problem # 4:  ATRIAL FIBRILLATION (ICD-427.31) Stable, good rate control.  On coumadin.  If she ever needs to come off coumadin, she should be bridged by Lovenox or heparin gtt when INR<2 given multiple cardioembolic events.    Problem # 5:  SHORTNESS OF BREATH (ICD-786.05) Suspect that this is primarily due to obesity/deconditioning and diastolic CHF.  Cannot rule out small prior MI on myoview, but could be due to artifact.  Will treat medically.    Patient Instructions: 1)  Your physician recommends that you schedule a follow-up appointment in: 6 weeks with Dr Shirlee Latch 2)  Your physician recommends that you return for a FASTING lipid and profile: in 2 months 272.2 v58.69 3)  Your physician recommends that you return for lab work in:  2 weeks for BMP and BNP 401.9  v58.69 4)  Your physician has recommended you make the following change in your medication: carvedilol 18.75 mg twice a day (12.5 mg tablets - take one and 1/2 tablet twice a day, Lisinopril 40 mg a day and Simvastatin to 40 mg every evening. Prescriptions: SIMVASTATIN 40 MG TABS (SIMVASTATIN) one every  evening  #30 x 6   Entered by:   Charolotte Capuchin, RN   Authorized by:   Marca Ancona, MD   Signed by:   Charolotte Capuchin, RN on 07/26/2010   Method used:   Electronically to        [facility] (retail)       [address]       [place], [place]  [address]       Ph: [phone]       Fax: [phone]   RxID:   [id number] LISINOPRIL 40 MG TABS (LISINOPRIL) one daily  #30 x 6   Entered by:   Charolotte Capuchin, RN   Authorized by:   Marca Ancona, MD   Signed by:   Charolotte Capuchin, RN on 07/26/2010   Method used:   Electronically to        [facility] (retail)  [address]       [place], [place]  [address]       Ph: [phone]       Fax: [phone]   RxID:   [id number] COREG 12.5 MG TABS (CARVEDILOL) one and 1/2 tablets twice a day  #90 x 6   Entered by:   Charolotte Capuchin, RN   Authorized by:   Marca Ancona, MD   Signed by:   Charolotte Capuchin, RN on 07/26/2010   Method used:   Electronically to        [facility] (retail)       [address]       [place], [place]  [address]       Ph: [phone]       Fax: [phone]   RxID:   [id number]

## 2010-10-25 NOTE — Progress Notes (Signed)
Summary: questions re med  Phone Note Call from Patient   Caller: Patient [phone] Reason for Call: Talk to Nurse Summary of Call: pt has questions re frirolact Initial call taken by: Glynda Jaeger,  September 14, 2010 2:06 PM  Follow-up for Phone Call        Pt is aware medications were refilled at last visit ( spironolactone, coreg, and furosemide). Dosages unchanged. Mylo Red RN

## 2010-10-25 NOTE — Progress Notes (Signed)
  Phone Note Outgoing Call   Call placed by: Judithe Modest CMA,  August 03, 2010 10:12 AM  Follow-up for Phone Call        Called and spoke to patients son again this morning as Ms. Friedland did not call us back or come in for 2:15 coumadin appt yesterday.  Son informed me she left early this am for a Dr. appt but he did not know if she had intended on coming here to cardiology or if she was going [facility].  I will call back this afternoon as I do not know who is following with coumadin.   Follow-up by: Judithe Modest CMA,  August 03, 2010 10:11 AM  Additional Follow-up for Phone Call Additional follow up Details #1::        Called patient back today. She did go to [facility] this morning.  She did get her refill of her Coumadin from them.  She could not get here yesterday for the Coumadin appt.  Pt said she would see [place] for her lab appt on the 17th of this month Additional Follow-up by: Judithe Modest CMA,  August 03, 2010 12:45 PM

## 2010-10-25 NOTE — Assessment & Plan Note (Signed)
Summary: 6wk f/u sl   Visit Type:  6wk follow up Primary Provider:  Dr. Rudean Curt  CC:  shortness of breath and swelling in legs.  History of Present Illness: 77 yo with history of atrial fibrillation, diastolic CHF, and cardioembolic events to the right kidney, brain, and right arm returns for followup of exertional shortness of breath.  Echo showed moderate LVH, EF 60-65%, and severe diastolic dysfunction with moderate biatrial enlargement.  [name] showed a possible small anterior MI with mild peri-infarct ischemia versus breast artifact.   Since last appointment, patient thinks she is retaining more fluid.  Weight is up about 2 lbs.  She is short of breath after walking about 50-75 feet.  No orthopnea or chest pain.  Her BP is still running quite high this morning (175/113).  She says that she has taken all her morning meds.   Labs (8/11): BNP 212 Labs (9/11): K 4.3, creatinine 1.0 Labs (10/11): K 4.1, creatinine 1.1, LDL 143, HDL 42 Labs (11/11): BNP 342, K 3.8, creatinine 1.3  Problems Prior to Update: 1)  Chronic Diastolic Heart Failure  (ICD-428.32) 2)  Unspecified Essential Hypertension  (ICD-401.9) 3)  Shortness of Breath  (ICD-786.05) 4)  Acute On Chronic Diastolic Heart Failure  (ICD-428.33) 5)  Anemia  (ICD-285.9) 6)  Dyslipidemia  (ICD-272.4) 7)  Atrial Fibrillation  (ICD-427.31) 8)  Hypertension, Severe  (ICD-401.0)  Current Medications (verified): 1)  Warfarin Sodium 4 Mg  Tabs (Warfarin Sodium) .... One Daily 2)  Norvasc 10 Mg Tabs (Amlodipine Besylate) .... One Daily 3)  Lisinopril 40 Mg Tabs (Lisinopril) .... One Daily 4)  Clonidine Hcl 0.2 Mg Tabs (Clonidine Hcl) .Marland Kitchen.. 1 By Mouth Two Times A Day 5)  Simvastatin 40 Mg Tabs (Simvastatin) .... One Every Evening 6)  Coreg 12.5 Mg Tabs (Carvedilol) .... One and 1/2 Tablets Twice A Day 7)  Furosemide 40 Mg Tabs (Furosemide) .... One Daily 8)  Klor-Con M20 20 Meq Cr-Tabs (Potassium Chloride Crys Cr) ....  One Daily 9)  Spironolactone 25 Mg Tabs (Spironolactone) .... One-Half Tablet Daily (On Hold Pt Dose Not Know If She Is Still Taking) 10)  Aspirin 81 Mg Tbec (Aspirin) .... One Daily  Allergies (verified): No Known Drug Allergies  Past History:  Past Medical History: 1. Resistant HTN. Patient states that this has been poorly controlled for years.  2. Diastolic CHF:  Echo (7/11): EF 60-65%, moderate LVH, severe diastolic dysfunction.    3. Iron deficiency anemia. 4. Atrial fibrillation: Chronic. Has had cardioembolic right renal infarct in 12/06 and cardioembolic event to the right arm requiring right brachial embolectomy.  Both events occurred while subtherapeutic on coumadin.  5. Obese.  6. Left frontal embolic cerebrovascular accident. 7. Dyslipidemia. 8. Lexiscan myoview (9/11): EF 76%, normal wall motion, possible small anterior MI with mild peri-infarct ischemia but cannot rule out breast attenuation.   Family History: Reviewed history from 05/16/2010 and no changes required. Father died at 45 but she is not sure why.  Multiple family members with resistant HTN.   Social History: Reviewed history from 03/29/2010 and no changes required. She is widowed.  She has three living children.  She has one daughter who lives here [place].  She does not use tobacco or drink ETOH.    Review of Systems       All systems reviewed and negative except as per HPI.   Vital Signs:  Patient profile:   77 year old female Height:      64 inches  Weight:      219.75 pounds BMI:     37.86 Pulse rate:   96 / minute BP sitting:   175 / 113  (left arm) Cuff size:   large  Vitals Entered By: Caralee Ates CMA (September 03, 2010 8:57 AM)  Physical Exam  General:  Obese elderly female in no apparent distress Neck:  Neck supple, JVP 10-12 cm. No masses, thyromegaly or abnormal cervical nodes. Lungs:  Decreased breath sounds at bases bilaterally.  Heart:  Non-displaced PMI, chest non-tender;  irregular rate and rhythm, S1, S2 without rubs or gallops. 2/6 systolic murmur along the sternal border.  Carotid upstroke normal, no bruit. Pedals normal pulses. 1+ ankle edema.  Abdomen:  Bowel sounds positive; abdomen soft and non-tender without masses, organomegaly, or hernias noted. No hepatosplenomegaly. Extremities:  No clubbing or cyanosis. Neurologic:  Alert and oriented x 3. Psych:  Normal affect.   Impression & Recommendations:  Problem # 1:  CHRONIC DIASTOLIC HEART FAILURE (ICD-428.32) Patient is volume overloaded today with NYHA class III symptoms.  I will increase her Lasix to 40 mg two times a day.  BMET/BNP in 2 weeks and followup in 1 month.  She also will need better control of her blood pressure.   Problem # 2:  UNSPECIFIED ESSENTIAL HYPERTENSION (ICD-401.9) BP still poorly controlled.  Given resistant hypertension, I am going to have her start spironolactone at 12.5 mg daily.  I will also increase her Coreg to 25 mg two times a day.  Next step will be the addition of hydralazine.  She states that she has been compliant with her meds.  BMET in 2 wks.   Problem # 3:  ATRIAL FIBRILLATION (ICD-427.31) Stable, good rate control.  On coumadin.  If she ever needs to come off coumadin, she should be bridged by Lovenox or heparin gtt when INR<2 given multiple cardioembolic events.    Problem # 4:  CAD Possible CAD based on myoview.  No chest pain.  Have been treating medically.  Goal LDL < 70.  Lipids/LFTs in 1/12.   Patient Instructions: 1)  Your physician has recommended you make the following change in your medication:  2)  Increase Lasix(furosemide) to 40mg  twice a day. 3)  Take Sprionolactone 12.5mg  daily--this will be one-half of a 25mg  tablet daily. 4)  Increase Coreg(Carevedilol) to 25mg  twice a day. 5)  Your physician recommends that you return for lab work in: 2 weeks--BMP/BNP 427.31  428.32 6)  Your physician recommends that you schedule a follow-up appointment in: 1  month with Dr Shirlee Latch. Prescriptions: SPIRONOLACTONE 25 MG TABS (SPIRONOLACTONE) one-half tablet daily  #15 x 6   Entered by:   Katina Dung, RN, BSN   Authorized by:   Marca Ancona, MD   Signed by:   Katina Dung, RN, BSN on 09/03/2010   Method used:   Electronically to        [facility] (retail)       [address]       [place], [place]  [address]       Ph: [phone]       Fax: [phone]   RxID:   [id number] FUROSEMIDE 40 MG TABS (FUROSEMIDE) one twice a day  #60 x 6   Entered by:   Katina Dung, RN, BSN   Authorized by:   Marca Ancona, MD   Signed by:   Katina Dung, RN, BSN on 09/03/2010  Method used:   [facility]ronically [facility]        [facility]       [address]       [place], [place]  [address]       Ph: [phone]       Fax: [phone]   RxID:   [id number] COREG 25 MG TABS (CARVEDILOL) one twice a day  #60 x 6   Entered by:   Katina Dung, RN, BSN   Authorized by:   Marca Ancona, MD   Signed by:   Katina Dung, RN, BSN on 09/03/2010   Method used:   Electronically to        [facility] (retail)       [address]       [place], [place]  [address]       Ph: [phone]       Fax: [phone]   RxID:   [id number]

## 2010-10-25 NOTE — Progress Notes (Signed)
Summary: medication question  Phone Note Call from Patient Call back at Home Phone [phone]   Caller: Son / Neita Goodnight Reason for Call: Talk to Nurse, Talk to Doctor Summary of Call: pt medication has increased in price and pt can't afford the medicine and the son wants to talk to someone about it Initial call taken by: Omer Jack,  October 12, 2010 1:38 PM    New/Updated Medications: CRESTOR 20 MG TABS (ROSUVASTATIN CALCIUM) one daily LIPITOR 40 MG TABS (ATORVASTATIN CALCIUM) one daily Prescriptions: CRESTOR 20 MG TABS (ROSUVASTATIN CALCIUM) one daily  #30 x 3   Entered by:   Katina Dung, RN, BSN   Authorized by:   Marca Ancona, MD   Signed by:   Katina Dung, RN, BSN on 10/12/2010   Method used:   Electronically to        [facility] (retail)       [address]       [place], [place]  [address]       Ph: [phone]       Fax: [phone]   RxID:   [id number]    Current Medications (verified): 1)  Warfarin Sodium 4 Mg  Tabs (Warfarin Sodium) .... One Daily 2)  Norvasc 10 Mg Tabs (Amlodipine Besylate) .... One Daily 3)  Lisinopril 40 Mg Tabs (Lisinopril) .... One Daily 4)  Clonidine Hcl 0.2 Mg Tabs (Clonidine Hcl) .Marland Kitchen.. 1 By Mouth Two Times A Day 5)  Lipitor 40 Mg Tabs (Atorvastatin Calcium) .... One Daily 6)  Coreg 25 Mg Tabs (Carvedilol) .... One Twice A Day 7)  Furosemide 40 Mg Tabs (Furosemide) .... One Twice A Day 8)  Klor-Con M20 20 Meq Cr-Tabs (Potassium Chloride Crys Cr) .... One Daily 9)  Spironolactone 25 Mg Tabs (Spironolactone) .Marland Kitchen.. 1 By Mouth Daily 10)  Aspirin 81 Mg Tbec (Aspirin) .... One Daily  Allergies: No Known Drug Allergies I talked with Victorino Dike at [facility] pharmacy --Lipitor and Crestor are $140 out of pocket-they do not have current insurance information for pt--Jennifer states once they get current insurance information the Lipitor will be reasonable--I talked with pt's son,Elijah--he will get current  insurance information to [facility] and let me know if he has any problems getting Lipitor for pt
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
## 2010-10-25 NOTE — Letter (Signed)
Summary: [facility]   Imported By: Marylou Mccoy 04/17/2010 15:06:30  _____________________________________________________________________  External Attachment:    Type:   Image     Comment:   External Document

## 2010-10-25 NOTE — Letter (Signed)
Summary: Patient's Med List  Patient's Med List   Imported By: Marylou Mccoy 04/17/2010 15:10:19  _____________________________________________________________________  External Attachment:    Type:   Image     Comment:   External Document

## 2010-10-25 NOTE — Progress Notes (Signed)
Summary: refill request  Phone Note Refill Request Message from:  Patient on August 02, 2010 8:16 AM  Refills Requested: Medication #1:  WARFARIN SODIUM 4 MG  TABS one daily [facility]   Method Requested: Telephone to Pharmacy Initial call taken by: Glynda Jaeger,  August 02, 2010 8:17 AM  Follow-up for Phone Call        Pt INRs are not followed by our coumadin clinic.  I will call [facility] for this patients coumadin refill.  Called pt and informed her that we do not have a record of her INRs and need to fill it through [facility].  Follow-up by: Judithe Modest CMA,  August 02, 2010 9:59 AM     Appended Document: refill request Last time pts coumadin checked was in August at [facility].  She will need to have blood work at our office to have coumadin followed.  Will need to contact pt to come in for labs.  Judithe Modest, CMA  Appended Document: refill request Checked with Kennon Rounds in Coumadin clinic. They can work a new pt in at 2:15.  Called Ms. Rayson and informed her that [facility] has not drawn a coumadin level since August and that we also have no level on her.  She would need to come in for 2:15 appt for [place] to fill Coumadin.  She will check with Grandson and see if she can get here.  She will call us back, but pt is completely out of coumadin.  Judithe Modest, CMA

## 2010-10-25 NOTE — Assessment & Plan Note (Signed)
Summary: PER CHECK OUT/SF   Visit Type:  Follow-up Primary Provider:  Dr. Rudean Curt  CC:  shortness of breath and .  History of Present Illness: 77 yo with history of atrial fibrillation, diastolic CHF, and cardioembolic events to the right kidney, brain, and right arm returns for followup of exertional shortness of breath.  Echo showed moderate LVH, EF 60-65%, and severe diastolic dysfunction with moderate biatrial enlargement.  Steffanie Dunn showed a possible small anterior MI with mild peri-infarct ischemia versus breast artifact.   Patient is actually doing well.  She has only occasional slight chest pain that occurs after meals.  No exertional chest pain.  She is able to walk around in her house and clean the house without significant exertional dyspnea.  BP is high today at 190/108.  She has her meds here with her today and is apparently not taking lisinopril, Coreg, or spironolactone.  She is taking Norvasc and clonidine.    Labs (2/11): K 4, creatinine 1.09, HDL 49, LDL 99 Labs (7/11): K 4, creatinine 0.9, BNP 378 Labs (8/11): BNP 212 Labs (9/11): K 4.3, creatinine 1.0  Current Medications (verified): 1)  Warfarin Sodium 4 Mg  Tabs (Warfarin Sodium) .... One Daily 2)  Norvasc 10 Mg Tabs (Amlodipine Besylate) .... One Daily 3)  Lisinopril 40 Mg  Tabs (Lisinopril) .... One Daily (On Hold Pt Dose Not Know If She Is Still Taking) 4)  Clonidine Hcl 0.2 Mg Tabs (Clonidine Hcl) .Marland Kitchen.. 1 By Mouth Two Times A Day 5)  Simvastatin 20 Mg Tabs (Simvastatin) .... Take One Tablet At Bedtime 6)  Carvedilol 25 Mg Tabs (Carvedilol) .... One Tablet Two Times A Day (On Hold Pt Dose Not Know If She Is Still Taking) 7)  Furosemide 40 Mg Tabs (Furosemide) .... One Daily 8)  Klor-Con M20 20 Meq Cr-Tabs (Potassium Chloride Crys Cr) .... One Daily 9)  Spironolactone 25 Mg Tabs (Spironolactone) .... One-Half Tablet Daily (On Hold Pt Dose Not Know If She Is Still Taking)  Allergies (verified): No  Known Drug Allergies  Past History:  Past Medical History: 1. Resistant HTN. Patient states that this has been poorly controlled for years.  2. Diastolic CHF:  Echo (7/11): EF 60-65%, moderate LVH, severe diastolic dysfunction.    3. Iron deficiency anemia. 4. Atrial fibrillation: Chronic. Has had cardioembolic right renal infarct in 12/06 and cardioembolic event to the right arm requiring right brachial embolectomy.  Both events occurred while subtherapeutic on coumadin.  5. Obese.  6. Left frontal embolic cerebrovascular accident. 7. Dyslipidemia. 8. Lexiscan myoview (9/11): ef 76%, normal wall motion, possible small anterior MI with mild peri-infarct ischemia but cannot rule out breast attenuation.   Family History: Reviewed history from 05/16/2010 and no changes required. Father died at 85 but she is not sure why.  Multiple family members with resistant HTN.   Social History: Reviewed history from 03/29/2010 and no changes required. She is widowed.  She has three living children.  She has one daughter who lives here [place].  She does not use tobacco or drink ETOH.    Review of Systems       All systems reviewed and negative except as per HPI.   Vital Signs:  Patient profile:   77 year old female Height:      64 inches Weight:      219.50 pounds BMI:     37.81 Pulse rate:   76 / minute BP sitting:   190 / 108  (left arm) Cuff  size:   large  Vitals Entered By: Caralee Ates CMA (July 05, 2010 10:39 AM)  Physical Exam  General:  Obese elderly female in no apparent distress Neck:  Neck supple, no JVD. No masses, thyromegaly or abnormal cervical nodes. Lungs:  Mildly decreased breath sounds left base.  Heart:  Non-displaced PMI, chest non-tender; irregular rate and rhythm, S1, S2 without rubs or gallops. 2/6 systolic murmur along the sternal border.  Carotid upstroke normal, no bruit. Pedals normal pulses. No edema.  Abdomen:  Bowel sounds positive; abdomen soft and  non-tender without masses, organomegaly, or hernias noted. No hepatosplenomegaly. Extremities:  No clubbing or cyanosis. Neurologic:  Alert and oriented x 3. Psych:  Normal affect.   Impression & Recommendations:  Problem # 1:  SHORTNESS OF BREATH (ICD-786.05) Diastolic CHF, improved with use of Lasix.  Appears euvolemic today, continue current Lasix.  Need better blood pressure control.   Problem # 2:  DYSLIPIDEMIA (ICD-272.4) Needs lipids/LFTs.  Would aim for LDL at least < 100.   Problem # 3:  HYPERTENSION, SEVERE (ICD-401.0) Patient needs to restart her BP meds.  Will start Coreg at 12.5 mg two times a day and lisinopril at 20 mg daily.  She will return to clinic in 3 wks to reassess  BP.  BMET at that time.   Problem # 4:  ABNORMAL MYOVIEW Patient had an area of mild small anterior infart with some peri-infarct ischemia versus attenuation.  As the patient has no chest pain symptoms and EF is normal,  will manage conservatively for now.  Patient needs to get BP under control.  Goal LDL < 100 and ideally < 70. Begin ASA 81 mg daily.   Patient Instructions: 1)  Your physician has recommended you make the following change in your medication:  2)  Take Lisinopril 20mg  daily. 3)  Take Coreg(carvedilol) 12.5mg  twice a day.Marland Kitchen 4)  Take Aspirin 81mg  daily--this should be coated--it does not have to be a brand name, generic is OK. 5)  Your physician recommends that you return for a FASTING lipid profile/liver profile/BMP in 3 weeks 401.9 428.32 6)  Your physician recommends that you schedule a follow-up appointment in: 3 weeks with Dr Shirlee Latch. Prescriptions: COREG 12.5 MG TABS (CARVEDILOL) one twice a day  #60 x 6   Entered by:   Katina Dung, RN, BSN   Authorized by:   Marca Ancona, MD   Signed by:   Katina Dung, RN, BSN on 07/05/2010   Method used:   Electronically to        [facility] (retail)       [address]       [place], [place]  [address]        Ph: [phone]       Fax: [phone]   RxID:   [id number] LISINOPRIL 20 MG TABS (LISINOPRIL) one daily  #30 x 6   Entered by:   Katina Dung, RN, BSN   Authorized by:   Marca Ancona, MD   Signed by:   Katina Dung, RN, BSN on 07/05/2010   Method used:   Electronically to        [facility] (retail)       [address]       [place], [place]  [address]       Ph: [phone]       Fax: [phone]  RxID:   [id number]

## 2010-10-25 NOTE — Assessment & Plan Note (Signed)
Summary: Cardiology Nuclear Testing  Nuclear Med Background Indications for Stress Test: Evaluation for Ischemia   History: Echo  History Comments: 7/11 Moderate LVH  Symptoms: Chest Pressure, DOE, Rapid HR    Nuclear Pre-Procedure Cardiac Risk Factors: CVA, Hypertension, Lipids Caffeine/Decaff Intake: None NPO After: 8:00 PM Lungs: clear IV 0.9% NS with Angio Cath: 22g     IV Site: R Antecubital IV Started by: Irean Hong, RN Chest Size (in) 40     Cup Size C     Height (in): 64 Weight (lb): 214 BMI: 36.87 Tech Comments: Held carvedilol this am.  Nuclear Med Study 1 or 2 day study:  1 day     Stress Test Type:  Eugenie Birks Reading MD:  Olga Millers, MD     Referring MD:  Marca Ancona, MD Resting Radionuclide:  Technetium 56m Tetrofosmin     Resting Radionuclide Dose:  10.8 mCi  Stress Radionuclide:  Technetium 97m Tetrofosmin     Stress Radionuclide Dose:  33 mCi   Stress Protocol      Max HR:  113 bpm Max Systolic BP: 150 mm HgRate Pressure Product:  [address]  Lexiscan: 0.4 mg   Stress Test Technologist:  Bonnita Levan, RN     Nuclear Technologist:  Domenic Polite, CNMT  Rest Procedure  Myocardial perfusion imaging was performed at rest 45 minutes following the intravenous administration of Technetium 66m Tetrofosmin.  Stress Procedure  The patient received IV Lexiscan 0.4 mg over 15-seconds.  Technetium 24m Tetrofosmin injected at 30-seconds.  There were no significant changes with infusion. Patient c/o chest pressure and SOB during infusion.  Quantitative spect images were obtained after a 45 minute delay.  QPS Raw Data Images:  Acquisition technically good; normal left ventricular size. Stress Images:  There is decreased uptake in the anterior wall and apex. Rest Images:  There is decreased uptake in the anterior wall and apex; slightly less prominent compared to stress images. Subtraction (SDS):  These findings are consistent with small prior anterior infarct  with very mild peri-infarct ischemia; cannot R/O shifting breast attenuation as cause of defect. Transient Ischemic Dilatation:  0.96  (Normal <1.22)  Lung/Heart Ratio:  0.18  (Normal <0.45)  Quantitative Gated Spect Images QGS EDV:  73 ml QGS ESV:  18 ml QGS EF:  76 % QGS cine images:  Normal wall motion.   Overall Impression  Exercise Capacity: Lexiscan with no exercise. BP Response: Normal blood pressure response. Clinical Symptoms: There is chest pain ECG Impression: No significant ST segment change suggestive of ischemia; patient in atrial fibrillation during the study. Overall Impression: Abnormal lexiscan nuclear study with small prior anterior infarct and very mild peri-infarct ischemia; cannot R/O shifting breast attenuation as cause of defect.  Appended Document: Cardiology Nuclear Testing possible anterior infarct with peri-infarct ischemia. Will need to see her in the office soon.   Appended Document: Cardiology Nuclear Testing pt given results by telephone--appt made with Dr Shirlee Latch for 06/06/10

## 2010-10-25 NOTE — Assessment & Plan Note (Signed)
Summary: 2WK F/U ECHO   Primary Provider:  Dr. Rudean Curt   History of Present Illness: 77 yo with history of atrial fibrillation, diastolic CHF, and cardioembolic events to the right kidney, brain, and right arm returns for followup of exertional shortness of breath.  Since last appointment, patient has been doing better.  She has lost 16 lbs with Lasix use.  BP is lower but still too high (SBP 152 today).  Echo showed moderate LVH, EF 60-65%, and severe diastolic dysfunction with moderate biatrial enlargement.  She is breathing better and is now able to walk 2 blocks before becoming short of breath.  She does use 2-3 pillows.  She denies PND.  No chest pain.   Labs (2/11): K 4, creatinine 1.09, HDL 49, LDL 99 Labs (7/11): K 4, creatinine 0.9, BNP 378  Current Medications (verified): 1)  Warfarin Sodium 4 Mg  Tabs (Warfarin Sodium) .... One Daily 2)  Norvasc 10 Mg Tabs (Amlodipine Besylate) .... One Daily 3)  Lisinopril 40 Mg  Tabs (Lisinopril) .... One Daily 4)  Clonidine Hcl 0.2 Mg Tabs (Clonidine Hcl) .Marland Kitchen.. 1 By Mouth Two Times A Day 5)  Simvastatin 20 Mg Tabs (Simvastatin) .... Take One Tablet At Bedtime 6)  Carvedilol 25 Mg Tabs (Carvedilol) .... One Tablet Two Times A Day 7)  Furosemide 40 Mg Tabs (Furosemide) .... One Daily 8)  Klor-Con M20 20 Meq Cr-Tabs (Potassium Chloride Crys Cr) .... One Daily  Allergies (verified): No Known Drug Allergies  Past History:  Past Medical History: 1. Resistant HTN. Patient states that this has been poorly controlled for years.  2. Diastolic CHF:  Echo (7/11): EF 60-65%, moderate LVH, severe diastolic dysfunction.    3. Iron deficiency anemia. 4. Atrial fibrillation: Chronic. Has had cardioembolic right renal infarct in 12/06 and cardioembolic event to the right arm requiring right brachial embolectomy.  Both events occurred while subtherapeutic on coumadin.  5. Obese.  6. Left frontal embolic cerebrovascular accident. 7.  Dyslipidemia.  Family History: Reviewed history from 05/16/2010 and no changes required. Father died at 31 but she is not sure why.  Multiple family members with resistant HTN.   Social History: Reviewed history from 03/29/2010 and no changes required. She is widowed.  She has three living children.  She has one daughter who lives here [place].  She does not use tobacco or drink ETOH.    Review of Systems       All systems reviewed and negative except as per HPI.   Vital Signs:  Patient profile:   77 year old female Height:      64 inches Weight:      212 pounds BMI:     36.52 Pulse rate:   102 / minute Resp:     18 per minute BP sitting:   152 /   Vitals Entered By: Marrion Coy, CNA (May 17, 2010 11:36 AM)  Physical Exam  General:  Obese elderly female in no apparent distress Neck:  Neck supple, JVP 8 cm. No masses, thyromegaly or abnormal cervical nodes. Lungs:  Clear bilaterally to auscultation and percussion. Heart:  Non-displaced PMI, chest non-tender; irregular rate and rhythm, S1, S2 without rubs or gallops. 2/6 systolic murmur along the sternal border.  Carotid upstroke normal, no bruit. Pedals normal pulses. No edema.  Abdomen:  Bowel sounds positive; abdomen soft and non-tender without masses, organomegaly, or hernias noted. No hepatosplenomegaly. Extremities:  No clubbing or cyanosis. Neurologic:  Alert and oriented x 3. Psych:  Normal  affect.   Impression & Recommendations:  Problem # 1:  ACUTE ON CHRONIC DIASTOLIC HEART FAILURE (ICD-428.33) Patient has significant diastolic dysfunction with a restrictive filling pattern on echo.  She is breathing much better with diuresis and has lost considerable weight.  Continue Lasix at current dose.  Will need to work to control BP.  Given significant exertional dyspnea and heart failure,  I am going to have her do a Lexiscan myoview for risk stratification.    Problem # 2:  HYPERTENSION, SEVERE (ICD-401.0) BP  better but still high.  Increase clonidine to 0.3 mg two times a day and start spironolactone 12.5 mg daily (? aldosterone dependent HTN).  BMET/BNP today and BMET in 2 wks.    Problem # 3:  ATRIAL FIBRILLATION (ICD-427.31) Stable, good rate control.  On coumadin.  If she ever needs to come off coumadin, she should be bridged by Lovenox or heparin gtt when INR<2 given multiple cardioembolic events.    Other Orders: TLB-BMP (Basic Metabolic Panel-BMET) (80048-METABOL) TLB-BNP (B-Natriuretic Peptide) (83880-BNPR) Nuclear Stress Test (Nuc Stress Test)  Patient Instructions: 1)  Your physician has recommended you make the following change in your medication:  2)  Increase Clonidine to 0.2mg  twice a day. 3)  Start Spironolactone 12.5mg  daily--this will be one-half of a 25mg  tablet. 4)  Your physician recommends that you have  lab work today---BMP/BNP 5)  Return for lab in 2 weeks-BMP 428.32 786.09 401.9 6)  Your physician recommends that you schedule a follow-up appointment in: 2 months with Dr Shirlee Latch. 7)  Your physician has requested that you have an lexiscan  myoview.  For further information please visit [email or web address]  Please follow instruction sheet, as given. Prescriptions: SPIRONOLACTONE 25 MG TABS (SPIRONOLACTONE) one-half tablet daily  #15 x 11   Entered by:   Katina Dung, RN, BSN   Authorized by:   Marca Ancona, MD   Signed by:   Katina Dung, RN, BSN on 05/17/2010   Method used:   Electronically to        [facility] (retail)       [address]       [place], [place]  [address]       Ph: [phone]       Fax: [phone]   RxID:   [id number] CLONIDINE HCL 0.2 MG TABS (CLONIDINE HCL) 1 by mouth two times a day  #60 x 11   Entered by:   Katina Dung, RN, BSN   Authorized by:   Marca Ancona, MD   Signed by:   Katina Dung, RN, BSN on 05/17/2010   Method used:   Electronically to        [facility] (retail)       [address]       [place], [place]  [address]       Ph: [phone]       Fax: [phone]   RxID:   [id number]

## 2010-10-25 NOTE — Assessment & Plan Note (Signed)
Summary: np6/htn/hyperlip/afib/eval for CHF/jml   Primary Provider:  Dr. Rudean Curt  CC:  new patient.   .  History of Present Illness: 77 yo with history of atrial fibrillation, diastolic CHF, and cardioembolic events to the right kidney, brain, and right arm presents for evaluation of exertional shortness of breath.  Over the last few weeks, patient has become gradually more short of breath.  She says that she can walk 1/2 mile to get to [employer] but is now having to stop multiple times on the way to rest.  She has to stop after walking 1-2 blocks (this seems new for her).  She is able to climb a flight of steps but is out of breath at the top.  No orthopnea or PND.  No exertional chest pain.  She does get GERD symptoms after meals.  Of note, her BP is >200/110 today.  She is not sure if she took all her blood pressure medications.  She denies headache, etc, and says that her BP often runs this high.    ECG: atrial fibrillation, lateral T wave inversions, rate 74  Labs (2/11): K 4, creatinine 1.09, HDL 49, LDL 99  Current Medications (verified): 1)  Warfarin Sodium 4 Mg  Tabs (Warfarin Sodium) .... One Daily 2)  Cardizem Cd 240 Mg  Cp24 (Diltiazem Hcl Coated Beads) .... One Daily 3)  Lisinopril 40 Mg  Tabs (Lisinopril) .... One Daily 4)  Clonidine Hcl 0.2 Mg Tabs (Clonidine Hcl) .... Two Times A Day 5)  Simvastatin 20 Mg Tabs (Simvastatin) .... Take One Tablet At Bedtime 6)  Carvedilol 25 Mg Tabs (Carvedilol) .... One Tablet Two Times A Day  Allergies (verified): No Known Drug Allergies  Past History:  Past Medical History: 1. Resistant HTN. Patient states that this has been poorly controlled for years.  2. Diastolic CHF: Echo (8/07) with EF 70%, moderate LV hypertrophy, diastolic dysfunction, mild aortic stenosis.  3. Iron deficiency anemia. 4. Atrial fibrillation: Chronic. Has had cardioembolic right renal infarct in 12/06 and cardioembolic event to the right arm requiring  right brachial embolectomy.  Both events occurred while subtherapeutic on coumadin.  5. Obese.  6. Left frontal embolic cerebrovascular accident. 7. Dyslipidemia.  Family History: Father died at 34 but she is not sure why.  Multiple family members with resistant HTN.   Social History: She is widowed.  She has three living children.  She has one daughter who lives here [place].  She does not use tobacco or drink ETOH.    Review of Systems       All systems reviewed and negative except as per HPI.   Vital Signs:  Patient profile:   77 year old female Height:      64 inches Weight:      228 pounds BMI:     39.28 Pulse rhythm:   regular BP sitting:   200 / 110  (left arm) Cuff size:   large  Vitals Entered By: Judithe Modest CMA (March 29, 2010 3:27 PM)  Physical Exam  General:  Obese elderly female in no apparent distress Head:  normocephalic and atraumatic Nose:  no deformity, discharge, inflammation, or lesions Mouth:  Teeth, gums and palate normal. Oral mucosa normal. Neck:  Neck supple, JVP 10-12 cm. No masses, thyromegaly or abnormal cervical nodes. Lungs:  Decreased breath sounds at the bases bilaterally.  Heart:  Non-displaced PMI, chest non-tender; irregular rate and rhythm, S1, S2 without murmurs, rubs or gallops. Carotid upstroke normal, no bruit.  Pedals normal pulses. 1+ edema 3/4 up lower legs bilaterally.  Abdomen:  Bowel sounds positive; abdomen soft and non-tender without masses, organomegaly, or hernias noted. No hepatosplenomegaly. Extremities:  No clubbing or cyanosis. Neurologic:  Alert and oriented x 3. Skin:  Intact without lesions or rashes. Psych:  Normal affect.   Impression & Recommendations:  Problem # 1:  ACUTE ON CHRONIC DIASTOLIC HEART FAILURE (ICD-428.33) Patient is volume overloaded on exam today with NYHA III symptoms.  She needs diuresis.   - Check echo for LV systolic function. - BMET, BNP today.   - Start Lasix 40 mg daily and KCl 20  mEq daily  - Need to control BP.   Problem # 2:  ATRIAL FIBRILLATION (ICD-427.31) Stable, good rate control.  On coumadin.  If she ever needs to come off coumadin, she should be bridged by Lovenox or heparin gtt when INR<2 given multiple cardioembolic events.    Problem # 3:  HYPERTENSION, SEVERE (ICD-401.0) Resistant.  Very high today but did not take all her meds.  No headache or other acute symptom.  She says that her BP very often runs above 200.  I will aim for slow lowering.  I will change diltiazem to Norvasc 10 mg daily.  I will also increase clonidine to 0.3 mg two times a day.   Other Orders: Echocardiogram (Echo) TLB-BNP (B-Natriuretic Peptide) (83880-BNPR) TLB-BMP (Basic Metabolic Panel-BMET) (80048-METABOL)  Patient Instructions: 1)  Your physician has recommended you make the following change in your medication:  2)  Stop Diltiazem 3)  Start Norvasc 10mg  daily 4)  Start Lasix(furosemide) 40mg  daily 5)  Start KCL(potassium) daily 6)  Increase Clonidine to 0.3mg  twice a day--you can take one and one-half of Clonidine 0.2mg  twice a day. 7)  Your physician recommends that you have lab today--BMP/BNP 401.9 428.33 8)  Your physician has requested that you have an echocardiogram.  Echocardiography is a painless test that uses sound waves to create images of your heart. It provides your doctor with information about the size and shape of your heart and how well your heart's chambers and valves are working.  This procedure takes approximately one hour. There are no restrictions for this procedure. Within 2 weeks 9)  Your physician recommends that you schedule a follow-up appointment in: about 2 weeks with Dr Shirlee Latch after the echocardiogram has been done. Prescriptions: KLOR-CON M20 20 MEQ CR-TABS (POTASSIUM CHLORIDE CRYS CR) one daily  #30 x 6   Entered by:   Katina Dung, RN, BSN   Authorized by:   Marca Ancona, MD   Signed by:   Katina Dung, RN, BSN on 03/29/2010    Method used:   Electronically to        [facility] (retail)       [address]       [place], [place]  [address]       Ph: [phone]       Fax: [phone]   RxID:   [id number] FUROSEMIDE 40 MG TABS (FUROSEMIDE) one daily  #30 x 6   Entered by:   Katina Dung, RN, BSN   Authorized by:   Marca Ancona, MD   Signed by:   Katina Dung, RN, BSN on 03/29/2010   Method used:   Electronically to        [facility] (retail)       [address]  [place], [place]  [address]       Ph: [phone]       Fax: [phone]   RxID:   [id number] CLONIDINE HCL 0.3 MG TABS (CLONIDINE HCL) one twice a day  #60 x 6   Entered by:   Katina Dung, RN, BSN   Authorized by:   Marca Ancona, MD   Signed by:   Katina Dung, RN, BSN on 03/29/2010   Method used:   Electronically to        [facility] (retail)       [address]       [place], [place]  [address]       Ph: [phone]       Fax: [phone]   RxID:   [id number] NORVASC 10 MG TABS (AMLODIPINE BESYLATE) one daily  #30 x 6   Entered by:   Katina Dung, RN, BSN   Authorized by:   Marca Ancona, MD   Signed by:   Katina Dung, RN, BSN on 03/29/2010   Method used:   Electronically to        [facility] (retail)       [address]       [place], [place]  [address]       Ph: [phone]       Fax: [phone]   RxID:   [id number]

## 2010-11-15 ENCOUNTER — Encounter: Payer: Self-pay | Admitting: Cardiology

## 2010-11-29 ENCOUNTER — Telehealth: Payer: Self-pay | Admitting: Cardiology

## 2010-11-29 ENCOUNTER — Other Ambulatory Visit (INDEPENDENT_AMBULATORY_CARE_PROVIDER_SITE_OTHER): Payer: Medicare Other

## 2010-11-29 ENCOUNTER — Other Ambulatory Visit: Payer: Self-pay | Admitting: Cardiology

## 2010-11-29 ENCOUNTER — Encounter: Payer: Self-pay | Admitting: Cardiology

## 2010-11-29 DIAGNOSIS — I4891 Unspecified atrial fibrillation: Secondary | ICD-10-CM

## 2010-11-29 DIAGNOSIS — I5032 Chronic diastolic (congestive) heart failure: Secondary | ICD-10-CM

## 2010-11-29 LAB — HEPATIC FUNCTION PANEL
ALT: 21 U/L (ref 0–35)
AST: 22 U/L (ref 0–37)
Albumin: 3.7 g/dL (ref 3.5–5.2)

## 2010-11-29 LAB — LIPID PANEL: Cholesterol: 145 mg/dL (ref 0–200)

## 2010-12-04 NOTE — Progress Notes (Signed)
Summary: pt calling back re meds  Phone Note Call from Patient Call back at Home Phone [phone]   Summary of Call: pt states someone call and ask what was the lipitor mg pt states she takes lipitor is 40mg  Initial call taken by: Roe Coombs,  November 29, 2010 3:18 PM  Follow-up for Phone Call        unable to reach pt by telephone--pt needs  to increase Lipitor to 80mg  daily(see  lab report 11/29/10) Katina Dung, RN, BSN  November 29, 2010 3:45 PM --I talked with patient and son about recent lab

## 2010-12-10 ENCOUNTER — Other Ambulatory Visit: Payer: Self-pay

## 2010-12-11 ENCOUNTER — Telehealth: Payer: Self-pay | Admitting: Cardiology

## 2010-12-11 NOTE — Telephone Encounter (Signed)
I talked with pt--she states she did not call. She states she did not have any questions and was not having any problems

## 2010-12-17 ENCOUNTER — Encounter: Payer: Self-pay | Admitting: *Deleted

## 2010-12-31 ENCOUNTER — Ambulatory Visit: Payer: Self-pay | Admitting: Cardiology

## 2011-01-01 ENCOUNTER — Other Ambulatory Visit: Payer: Self-pay | Admitting: Cardiology

## 2011-01-07 LAB — POCT I-STAT, CHEM 8
BUN: 10 mg/dL (ref 6–23)
Calcium, Ion: 1.16 mmol/L (ref 1.12–1.32)
Chloride: 101 mEq/L (ref 96–112)
Glucose, Bld: 92 mg/dL (ref 70–99)
TCO2: 30 mmol/L (ref 0–100)

## 2011-01-07 LAB — DIFFERENTIAL
Eosinophils Relative: 1 % (ref 0–5)
Lymphocytes Relative: 38 % (ref 12–46)
Monocytes Absolute: 0.3 10*3/uL (ref 0.1–1.0)
Monocytes Relative: 6 % (ref 3–12)
Neutro Abs: 2.5 10*3/uL (ref 1.7–7.7)

## 2011-01-07 LAB — CBC
HCT: 45 % (ref 36.0–46.0)
Hemoglobin: 15.1 g/dL — ABNORMAL HIGH (ref 12.0–15.0)
MCHC: 33.6 g/dL (ref 30.0–36.0)
RBC: 4.99 MIL/uL (ref 3.87–5.11)
RDW: 13.7 % (ref 11.5–15.5)

## 2011-01-07 LAB — POCT CARDIAC MARKERS: Troponin i, poc: <0.05 ng/mL (ref 0.00–0.09)

## 2011-01-17 ENCOUNTER — Ambulatory Visit (INDEPENDENT_AMBULATORY_CARE_PROVIDER_SITE_OTHER): Payer: Medicare Other | Admitting: Cardiology

## 2011-01-17 ENCOUNTER — Encounter: Payer: Self-pay | Admitting: Cardiology

## 2011-01-17 DIAGNOSIS — I509 Heart failure, unspecified: Secondary | ICD-10-CM

## 2011-01-17 DIAGNOSIS — I4891 Unspecified atrial fibrillation: Secondary | ICD-10-CM

## 2011-01-17 DIAGNOSIS — M79606 Pain in leg, unspecified: Secondary | ICD-10-CM

## 2011-01-17 DIAGNOSIS — M79609 Pain in unspecified limb: Secondary | ICD-10-CM

## 2011-01-17 DIAGNOSIS — I2510 Atherosclerotic heart disease of native coronary artery without angina pectoris: Secondary | ICD-10-CM

## 2011-01-17 DIAGNOSIS — I5032 Chronic diastolic (congestive) heart failure: Secondary | ICD-10-CM

## 2011-01-17 DIAGNOSIS — I10 Essential (primary) hypertension: Secondary | ICD-10-CM

## 2011-01-17 DIAGNOSIS — R0989 Other specified symptoms and signs involving the circulatory and respiratory systems: Secondary | ICD-10-CM

## 2011-01-17 DIAGNOSIS — I739 Peripheral vascular disease, unspecified: Secondary | ICD-10-CM | POA: Insufficient documentation

## 2011-01-17 DIAGNOSIS — E785 Hyperlipidemia, unspecified: Secondary | ICD-10-CM

## 2011-01-17 MED ORDER — CARVEDILOL 25 MG PO TABS: 25.0000 mg | ORAL_TABLET | Freq: Two times a day (BID) | ORAL | Status: DC

## 2011-01-17 NOTE — Progress Notes (Signed)
Dr. Rudean Curt  77 yo with history of atrial fibrillation, diastolic CHF, HTN, and cardioembolic events to the right kidney, brain, and right arm returns for followup of exertional shortness of breath.  Echo in 2011 showed moderate LVH, EF 60-65%, and severe diastolic dysfunction with moderate biatrial enlargement.  Lexiscan myoview in 2011 showed a possible small anterior MI with mild peri-infarct ischemia versus breast artifact.   Since last appointment, she has been symptomatically stable.  She is able to walk around the house without significant exertional dyspnea.  She gets short of breath if she tries to walk much farther.  Weight is stable at 217 lbs.  No chest pain.  She does get some calf pain with ambulation, this is hard for her to describe further.  She is on quite a large number of medications, and I am not sure that she is taking them properly.  Her doses of lisinopril (20 mg daily) and carvedilol (12.5 mg bid) are both lower than what she was taking at prior appointment.  She is not sure who decreased the doses or why.  BP is high at 150/100.    ECG: atrial fibrillation at 81 with nonspecific lateral T wave flattening  Labs (8/11): BNP 212 Labs (9/11): K 4.3, creatinine 1.0 Labs (10/11): K 4.1, creatinine 1.1, LDL 143, HDL 42 Labs (11/11): BNP 342, K 3.8, creatinine  Labs (12/11): K 4.2, creatinine 1.2, BNP 339, LDL 97, HDL 38, LFTs normal Labs (5/63): LDL 97, HDL 38  Allergies (verified):  No Known Drug Allergies  Past Medical History: 1. Resistant HTN. Patient states that this has been poorly controlled for years.  2. Diastolic CHF:  Echo (7/11): EF 60-65%, moderate LVH, severe diastolic dysfunction.   3. Iron deficiency anemia. 4. Atrial fibrillation: Chronic. Has had cardioembolic right renal infarct in 12/06 and cardioembolic event to the right arm requiring right brachial embolectomy.  Both events occurred while subtherapeutic on coumadin.  5. Obese.  6. Left frontal  embolic cerebrovascular accident. 7. Dyslipidemia. 8. Lexiscan myoview (9/11): EF 76%, normal wall motion, possible small anterior MI with mild peri-infarct ischemia but cannot rule out breast attenuation.   Family History: Father died at 79 but she is not sure why.  Multiple family members with resistant HTN.   Social History: She is widowed.  She has three living children.  She has one daughter who lives here [place].  She does not use tobacco or drink ETOH.    Review of Systems        All systems reviewed and negative except as per HPI.   Current Outpatient Prescriptions  Medication Sig Dispense Refill  . amLODipine (NORVASC) 10 MG tablet TAKE ONE TABLET BY MOUTH EVERY DAY  30 tablet  10  . atorvastatin (LIPITOR) 80 MG tablet Take 80 mg by mouth daily.        . cloNIDine (CATAPRES) 0.2 MG tablet Take 0.2 mg by mouth 2 (two) times daily.        . furosemide (LASIX) 40 MG tablet Take 40 mg by mouth 2 (two) times daily.        Marland Kitchen lisinopril (PRINIVIL,ZESTRIL) 20 MG tablet Take 20 mg by mouth daily.        . potassium chloride SA (KLOR-CON M20) 20 MEQ tablet Take 20 mEq by mouth daily.        Marland Kitchen spironolactone (ALDACTONE) 25 MG tablet Take 25 mg by mouth daily.        Marland Kitchen warfarin (COUMADIN) 4 MG  tablet Take 4 mg by mouth as directed.        Marland Kitchen DISCONTD: carvedilol (COREG) 12.5 MG tablet Take 12.5 mg by mouth 2 (two) times daily with a meal.        . aspirin 81 MG tablet Take 81 mg by mouth daily.        . carvedilol (COREG) 25 MG tablet Take 1 tablet (25 mg total) by mouth 2 (two) times daily.  60 tablet  6  . DISCONTD: carvedilol (COREG) 25 MG tablet Take 25 mg by mouth 2 (two) times daily with a meal.        . DISCONTD: lisinopril (PRINIVIL,ZESTRIL) 40 MG tablet Take 40 mg by mouth daily.          BP 150/100  Pulse 78  Resp 18  Ht 5\' 4"  (1.626 m)  Wt 217 lb 1.9 oz (98.485 kg)  BMI 37.27 kg/m2 General:  Obese elderly female in no apparent distress Neck:  Neck supple, no JVD. No  masses, thyromegaly or abnormal cervical nodes. Lungs:  Clear bilaterally to auscultation and percussion. Heart:  Non-displaced PMI, chest non-tender; irregular rate and rhythm, S1, S2 without rubs or gallops. 1/6 systolic murmur along the sternal border.  Carotid upstroke normal, no bruit. Difficult to feel pedal pulses. No edema.  Abdomen:  Bowel sounds positive; abdomen soft and non-tender without masses, organomegaly, or hernias noted. No hepatosplenomegaly. Extremities:  No clubbing or cyanosis. Neurologic:  Alert and oriented x 3. Psych:  Normal affect.

## 2011-01-17 NOTE — Assessment & Plan Note (Signed)
Patient appears close to euvolemic today.  Symptomatically stable.  Will continue current dose of Lasix and KCl.  Will check BMET with lipids in 5/12.

## 2011-01-17 NOTE — Assessment & Plan Note (Signed)
Myoview in 2011 with possible small anterior MI with peri-infarct ischemia.  No chest pain.  Medically manage, control blood pressure.  Goal LDL < 70.  She is on ASA 81 mg daily.

## 2011-01-17 NOTE — Assessment & Plan Note (Signed)
Stable, good rate control.  On coumadin.  If she ever needs to come off coumadin, she should be bridged by Lovenox or heparin gtt when INR<2 given multiple cardioembolic events.

## 2011-01-17 NOTE — Assessment & Plan Note (Signed)
Lipitor increased to 80 mg daily.   Check lipids/LFTs in 5/12.

## 2011-01-17 NOTE — Patient Instructions (Addendum)
Increase Coreg(carvedilol) to 25mg  twice a day. You can take two 12.5mg  Coreg(carvedilol) twice a day.  Schedule an appointment for a lower extremity arterial doppler.  Schedule an appointment with the pharmacist to review your medications with you.   Please add a BMP to the May lab orders/appointment already scheduled.  Schedule a follow-up appointment to see Dr Shirlee Latch in 3 months.

## 2011-01-17 NOTE — Assessment & Plan Note (Signed)
BP is high.  I am unsure why her doses of lisinopril and Coreg are lower than before.  I will increase Coreg back to 25 mg bid.

## 2011-01-17 NOTE — Assessment & Plan Note (Addendum)
Difficult to palpate pedal pulses and patient complains of poorly differentiated leg pain with ambulation.  I will get peripheral arterial dopplers.    I am also going to have patient followup with our clinical pharmacist to help with organizing her medications.

## 2011-01-24 ENCOUNTER — Encounter: Payer: Medicare Other | Admitting: Cardiology

## 2011-01-24 ENCOUNTER — Other Ambulatory Visit (INDEPENDENT_AMBULATORY_CARE_PROVIDER_SITE_OTHER): Payer: Medicare Other | Admitting: *Deleted

## 2011-01-24 ENCOUNTER — Ambulatory Visit (INDEPENDENT_AMBULATORY_CARE_PROVIDER_SITE_OTHER): Payer: Medicare Other | Admitting: Cardiology

## 2011-01-24 ENCOUNTER — Telehealth: Payer: Self-pay | Admitting: *Deleted

## 2011-01-24 ENCOUNTER — Ambulatory Visit (INDEPENDENT_AMBULATORY_CARE_PROVIDER_SITE_OTHER): Payer: Medicare Other | Admitting: Pharmacist

## 2011-01-24 DIAGNOSIS — I4891 Unspecified atrial fibrillation: Secondary | ICD-10-CM

## 2011-01-24 DIAGNOSIS — I5032 Chronic diastolic (congestive) heart failure: Secondary | ICD-10-CM

## 2011-01-24 DIAGNOSIS — I509 Heart failure, unspecified: Secondary | ICD-10-CM

## 2011-01-24 DIAGNOSIS — E875 Hyperkalemia: Secondary | ICD-10-CM

## 2011-01-24 DIAGNOSIS — I10 Essential (primary) hypertension: Secondary | ICD-10-CM

## 2011-01-24 DIAGNOSIS — Z7901 Long term (current) use of anticoagulants: Secondary | ICD-10-CM | POA: Insufficient documentation

## 2011-01-24 DIAGNOSIS — I2510 Atherosclerotic heart disease of native coronary artery without angina pectoris: Secondary | ICD-10-CM

## 2011-01-24 LAB — POCT INR: INR: 2.6

## 2011-01-24 LAB — BASIC METABOLIC PANEL
BUN: 39 mg/dL — ABNORMAL HIGH (ref 6–23)
Calcium: 9.6 mg/dL (ref 8.4–10.5)
GFR: 39.42 mL/min — ABNORMAL LOW (ref >60.00)
Potassium: 5.2 mEq/L — ABNORMAL HIGH (ref 3.5–5.1)

## 2011-01-24 MED ORDER — WARFARIN SODIUM 4 MG PO TABS: 4.0000 mg | ORAL_TABLET | ORAL | Status: DC

## 2011-01-24 NOTE — Telephone Encounter (Signed)
Notes Recorded by Jacqlyn Krauss, RN on 01/24/2011 at 5:17 PM I reviewed with Dr Lollie Marrow, Hold Spironolactone, decrease Lisinopril to 10mg ( 1/2 of a 20mg  tablet) daily, decrease Lasix to 40mg  daily. Repeat BMP in 1 week. I talked with pt's daughter in law and she verbalized understanding. Pt to return for BMP 01/31/11. Preliminarily reviewed by Triage. Awaiting MD review and signature. Notes Recorded by Jacqlyn Krauss, RN on 01/24/2011 at 5:05 PM I talked with pt's daughter in [place]- [phone]. Pt taking KCL 20 mEq daily. Pt will hold KCL and I will review with Dr Shirlee Latch. Preliminarily reviewed by Triage. Awaiting MD review and signature.

## 2011-01-24 NOTE — Progress Notes (Signed)
Patient presents to pharmacy clinic per Dr. Alford Highland request. Patient has had difficulty keeping up with medications and at last visit with Dr. Shirlee Latch, she presented to him taking different strengths of medications from what he had prescribed. Upon examination of the bottles at this visit, none of the medications were in the correct labeled bottle. The patient was mixing several medications in one bottle or had the complete wrong medication in a bottle. Each medication was identified and placed in the correctly labeled bottle. Patient was provided with a pill box and a week's worth of medication was placed into the box. She did not have the correct strength of lipitor or coreg with her. She also did not have enough warfarin for me to put it in the box. I informed the patient to get these filled at the pharmacy and to make sure she adds them to the box. Patient had a family member with her present who promised to pick up the prescriptions and help her place them into the box. An INR was also checked today which was 2.6. Anticoagulation management will now be provided by [place] as it was discovered that she was supposed to be managed by [facility] but has not been checked there since August. She was given instructions to continue her current coumadin regimen and follow up with our coumadin clinic.

## 2011-01-31 ENCOUNTER — Other Ambulatory Visit: Payer: Medicare Other | Admitting: *Deleted

## 2011-02-04 ENCOUNTER — Other Ambulatory Visit (INDEPENDENT_AMBULATORY_CARE_PROVIDER_SITE_OTHER): Payer: Medicare Other | Admitting: *Deleted

## 2011-02-04 DIAGNOSIS — E875 Hyperkalemia: Secondary | ICD-10-CM

## 2011-02-04 LAB — BASIC METABOLIC PANEL
CO2: 27 mEq/L (ref 19–32)
Calcium: 9 mg/dL (ref 8.4–10.5)
GFR: 49.42 mL/min — ABNORMAL LOW (ref >60.00)
Sodium: 134 mEq/L — ABNORMAL LOW (ref 135–145)

## 2011-02-08 NOTE — H&P (Signed)
[name], [name]                  ACCOUNT NO.:  [id number]   MEDICAL RECORD NO.:  [id number]           PATIENT TYPE:   LOCATION:                                 FACILITY:   PHYSICIAN:  Isidor Holts, M.D.       DATE OF BIRTH:   DATE OF ADMISSION:  08/23/2005  DATE OF DISCHARGE:                                HISTORY & PHYSICAL   PRIMARY CARE PHYSICIAN:  Unassigned.   CHIEF COMPLAINT:  Right flank/abdominal pain.   HISTORY OF PRESENT ILLNESS:  This is a 77 year old female, known  hypertensive, although she has not been on antihypertensive medications for  some time now.  She was quite well, until 4:30 a.m. August 22, 2005 when  she got up to go to the bathroom.  While moving her bowels she developed  pain in the right flank/lower abdomen.  Denies vomiting or diarrhea.  Denies  fever.  No history of previous similar episodes.  The pain has been constant  ever since.   PAST MEDICAL HISTORY:  Hypertension, nothing else of significance.   MEDICATIONS:  Aspirin.   ALLERGIES:  No known drug allergies.   REVIEW OF SYSTEMS:  As per HPI and chief complaint, otherwise negative.   SOCIAL HISTORY:  Patient's sons live with her.  She has three offspring.  She is a nonsmoker, although she says she is exposed to passive smoking.  Drinks alcohol only occasionally.  No history of drug abuse.  She has two  sisters, one of whom is asthmatic.  The other is hypertensive.  Her parents  had hypertension and heart disease.   PHYSICAL EXAMINATION:  VITAL SIGNS:  Temperature 98.9, pulse 78 per minute,  respiratory rate 24, blood pressure 126/74 mmHg, subsequently 207/107 mmHg,  pulse oximeter 98% on room air.  GENERAL:  Patient appears to be in some discomfort from right flank pain.  Not short of breath at rest.  HEENT:  No clinical pallor.  No jaundice.  No conjunctival injection.  Throat appears quite clear.  NECK:  Supple.  There is no palpable lymphadenopathy.  No palpable goiter.  JVP is  not seen, due to fat neck.  CHEST:  Clinically clear to auscultation.  No wheezes.  No crackles.  Heart  sounds 1 and 2 heard, normal, irregularly irregular.  Tachycardic.  ABDOMEN:  Obese.  I am unable to palpate organs, however, abdomen is soft  and there is tenderness in the right side.  Bowel sounds appear normal.  EXTREMITIES:  Lower extremity examination:  No pitting edema.  Palpable  peripheral pulses.  MUSCULOSKELETAL:  Quite unremarkable.  CENTRAL NERVOUS SYSTEM:  No focal neurologic deficit on gross examination.   INVESTIGATIONS:  CBC:  WBC 7, hemoglobin 13.7, hematocrit 40.3, platelets  263.  Electrolytes:  Sodium 139, potassium 3.4, chloride 101, CO2 24, BUN  18, creatinine 1.3, glucose 136.  LFTs are normal.  Lipase is 23.  Urinalysis is negative.  Abdominal/pelvic CT scan dated August 22, 2005  shows nonfunctioning right kidney, query patchy nonfunctioning lower pole of  left kidney or thromboembolic disease.  Chest x-ray dated August 22, 2005  shows cardiomegaly.  No CHF.  No active disease, elevated left  hemidiaphragm.  EKG shows atrial fibrillation, 112 per minute confirmed by  rhythm strip.   ASSESSMENT/PLAN:  1.  Right renal infarction and possible patchy left renal infarction.  This      is likely secondary to embolic etiology.  Patient has been seen by Dr.      Darrick Penna in vascular surgery and he recommends conservative management at      present. For details, refer to Dr Darrick Penna consultation notes.   1.  Atrial fibrillation.  This appears to be new diagnosis and is the likely      culprit for patient's renal infarction.  We will anticoagulate as soon      as blood pressure is controlled.  Meanwhile, will start intravenous      infusion of Cardizem as patient has a rapid ventricular rate.  We shall      correct electrolyte abnormalities and do 2-D echocardiogram to rule out      possible intracardiac thrombus.   1.  Hypertension.  This is uncontrolled and  severe.  Intravenous Cardizem      infusion should help, but patient will likely need scheduled medication      subsequently, possibly a beta blocker.  We shall check patient's lipid      profile and TSH for completeness.  Further management will depend on      clinical course.      Isidor Holts, M.D.  Electronically Signed     CO/MEDQ  D:  08/23/2005  T:  08/23/2005  Job:  [id number]   cc:   Janetta Hora. Fields, MD  [address], [place] [address]

## 2011-02-08 NOTE — Consult Note (Signed)
[name], [name]                  ACCOUNT NO.:  [id number]   MEDICAL RECORD NO.:  [id number]          PATIENT TYPE:  EMS   LOCATION:  [facility]                         FACILITY:  [facility]   PHYSICIAN:  Janetta Hora. Fields, MD  DATE OF BIRTH:  1932/12/10   DATE OF CONSULTATION:  08/22/2005  DATE OF DISCHARGE:                                   CONSULTATION   REASON FOR CONSULTATION:  Right kidney infarction.   HISTORY OF PRESENT ILLNESS:  The patient is a 77 year old female with no  significant past medical history other than hypertension.  She had acute  onset of right sided abdominal pain which started at approximately 4:30 a.m.  earlier today.  It is currently 10 p.m.  She states the pain has become  progressively worse over the course of the day.  She has had some nausea.  She has no history of any type of cardiac difficulties or history of atrial  fibrillation.  She has had no hematuria.  Pain onset was after having a  bowel movement.   PAST MEDICAL HISTORY:  As above.   PAST SURGICAL HISTORY:  None.   MEDICATIONS:  Herbal blood pressure medication remedy.   ALLERGIES:  No known drug allergies.   SOCIAL HISTORY:  She has no history of smoking.  She does consume alcohol  approximately once per week.   REVIEW OF SYSTEMS:  She denies shortness of breath, chest pain, diabetes,  prior stroke, and myocardial infarction.   PHYSICAL EXAMINATION:  VITAL SIGNS:  She has normal vital signs, systolic blood pressure 170 mmHg.  GENERAL:  She is a black female who is ill appearing and holding her  abdomen.  HEENT:  Unremarkable.  NECK:  No carotid bruits.  LUNGS:  Clear to auscultation.  HEART:  Regular rate and rhythm without murmur.  ABDOMEN:  Obese, nontender, nondistended, no masses or guarding.  EXTREMITIES:  2+ femoral pulses and her feet are warm.   CT of the abdomen and pelvis from earlier this evening is reviewed, there is  no perfusion of the right kidney, otherwise, CT scan is  unremarkable.  There  is no evidence of abdominal aortic aneurysm or thrombus within the aorta.   ASSESSMENT:  Infarcted right kidney with unknown warm ischemia time,  probably sometime between 6 and 18 hours.  There is no indication for  revascularization at this point due to the length of warm ischemia time.  She will  need follow up with her primary care doctor to monitor her blood pressure.  This may possibly become a pressor kidney at some point in the future and  would possibly require nephrectomy at some point in the future if her blood  pressure becomes difficult to control.           ______________________________  Janetta Hora. Fields, MD     CEF/MEDQ  D:  08/22/2005  T:  08/22/2005  Job:  [id number]

## 2011-02-08 NOTE — Consult Note (Signed)
NAMEASPYN, WARNKE                  ACCOUNT NO.:  [id number]   MEDICAL RECORD NO.:  [id number]          PATIENT TYPE:  INP   LOCATION:  2028                         FACILITY:  [facility]   PHYSICIAN:  Marolyn Hammock. Reynolds, M.D.DATE OF BIRTH:  11-26-1932   DATE OF CONSULTATION:  05/07/2006  DATE OF DISCHARGE:                                   CONSULTATION   REASON FOR EVALUATION:  Neurologic changes, possible stroke.   HISTORY OF PRESENT ILLNESS:  This is the initial inpatient consultation  evaluation of this 77 year old woman with a past medical history which  includes hypertension; atrial fibrillation with Coumadin noncompliance; and  previous manifestations of cardioembolic disease, including a cardioembolic  right renal infarction in December of last year.  The patient was admitted  to the [facility] service on May 06, 2006 with a new complaint of left arm  pain.  She is noted to be a patient of Dr. Darrick Penna in the past.   She presented to the emergency room with pain in her right upper extremity  of sudden onset; and was found at that time to have a cool right hand with a  mottled appearance, and pulses which were barely discernible by Doppler  testing.  She underwent a right brachial embolectomy and reportedly did  fairly well with that.  This morning she was reportedly comfortable and,  according to her family, last night she was her normal self.  However, today  she was felt to be lethargic by both her daytime nurse and by her family,  and ultimately this led to a rapid response evaluation at about 3:00 p.m.   The patient did not have any definite acute onset of any focal neurologic  symptoms.  She just seemed to be less responsive generally, although there  was a question of a right facial droop raised at one point.  At the time  that she had these changes, she was also noted to be significantly  hypertensive in the 226 systolic range, although she has run some high blood  pressures  during this admission.   On the order of the rapid response nurse, the patient was taken to CT scan,  given that she was on heparin, which was negative for an acute hemorrhage;  and subsequent neurologic evaluation is requested.  The patient herself at  this time appears uncomfortable.  She is complaining of pain in her right  arm and a little bit of nausea.  She denies any pain anywhere else.  She  feels that she is talking normally.  She is not highly responsive with the  examination and does not extensively answer questions.   PAST MEDICAL HISTORY:  Past medical history is remarkable for previous  cardioembolic disease as noted above.  She has a known history of  hypertension, which she has had a hard time controlling; and medical  noncompliance is suspected.  There is a history of anemia; and urinary tract  infections in the past.   FAMILY AND SOCIAL HISTORY AND REVIEW OF SYSTEMS:  As outlined in the initial  history and physical  by Dr. Hart Rochester, done May 06, 2006.   MEDICATIONS:  Her home medicines include Coumadin, which she apparently ran  out of before this admission; diltiazem; metoprolol; Clonidine; Pepcid;  pravastatin; lisinopril/HCTZ.  She remains on all those medications here --  as well as aspirin; and intravenous heparin.   PHYSICAL EXAMINATION:  VITAL SIGNS:  Temperature 100, blood pressure  236/130, now down to 226/80; pulse 63, respirations 16, O2 saturation 98% on  2 liters O2 nasal cannula.  CBG 152.  GENERAL APPEARANCE:  This is an ill-appearing and uncomfortable obese woman,  supine in the hospital bed, in no evident distress.  HEENT:  Cranium is normocephalic, atraumatic.  Oropharynx benign.  NECK:  The neck is supple without carotid bruits.  HEART:  Regular rate and rhythm without murmurs.  EXTREMITIES:  She has a bandage over the area of her right brachial  embolectomy which is leaking a little bit of blood.   NEUROLOGIC EXAMINATION:  MENTAL STATUS:  She  is awake and alert.  She  appears uncomfortable.  She is able to correctly identify time and place.  She can follow one- and two-step commands, can name objects.  CRANIAL NERVES:  Pupils are equal and reactive.  Extraocular movements are  full without nystagmus.  She blinks to threat on both sides of her visual  fields.  She seems to move her face, tongue and palate fairly well, although  there is a question of a right facial droop.  MOTOR:  Normal bulk and tone.  She appears to have normal strength in all  tested extremity muscles, although effort is not great.  SENSORY:  Limited to light touch testing.  She does have withdrawal  vigorously previously to painful stimulus in all extremities.  COORDINATION:  The patient is unable to perform.  GAIT:  Deferred.  REFLEXES:  2+ and symmetric.  Toes are downgoing bilaterally.   LABORATORY INVESTIGATIONS:  CBC:  White count 6.4, hemoglobin 11.4,  platelets 188,000; this was at 6:00 this evening.  Chemistries at 6:00 this  evening remarkable for an elevated glucose of 127, slightly low potassium at  3.3, otherwise unremarkable.   I personally reviewed the CT scan of the head performed this afternoon.  This study demonstrates no evidence of acute bleed.  She has a small  hypodensity in the left upper parietal white matter.  A question of this  being acute or subacute is raised by the radiologist.  This area is distinct  from a previously described area which has been described before.  I  personally reviewed the film.  I would agree with the findings as they are  questionable.   IMPRESSION:  1. Acute encephalopathy associated with an elevated temperature of 100,      incidentally with hypertension:  The etiology is uncertain.  It could      have various etiologies including toxo metabolic causes, infectious      causes, possibly compartment syndrome or other issue related to her     recent right upper extremity predicament with surgery.   Stroke event is      unlikely in this situation.  2. Questionable small left parietal stroke by imaging:  This may be a      genuine finding; but I do not think is a very good explanation for her      present state.  3. Refractory hypertension:  She does have history of hypertension with      medical noncompliance in the past.  Question if this is a source of #1      above.   PLAN:  I would like to control the blood pressure within the parameters for  acute stroke.  In addition, will arrange for an MRI in the morning as soon  as possible.  I do not know if she has PRES at this time or not, but will  also check work-up for other possible infectious and embolic issues, etc.   Will follow with you.  Thank you for this consultation.      Michael L. Thad Ranger, M.D.  Electronically Signed     MLR/MEDQ  D:  05/07/2006  T:  05/07/2006  Job:  [id number]

## 2011-02-08 NOTE — Consult Note (Signed)
[name], [name]                  ACCOUNT NO.:  [id number]   MEDICAL RECORD NO.:  [id number]          PATIENT TYPE:  INP   LOCATION:  2028                         FACILITY:  [facility]   PHYSICIAN:  Jackie Plum, M.D.DATE OF BIRTH:  1933/03/31   DATE OF CONSULTATION:  DATE OF DISCHARGE:                                   CONSULTATION   REASON FOR CONSULTATION:  __________ hypertension.   The patient was seen and examined and the patient's Medicare records in the  E-Chat system were all reviewed.   PROBLEM LIST:  1. Accelerated hypertension.  2. Hypertensive heart disease (Patient's 2D echocardiogram done on May 06, 2006 indicated normal EF of 70 or 80% with left ventricular wall      thickness, moderate, as well as high left ventricular volume      pressures).  3. History of iron deficiency anemia.  4. History of infarcted right kidney.  5. History of atrial fibrillation.  6. History of left frontal embolic cerebrovascular accident.  7. History of dyslipidemia.   PLAN:  The patient was started on p.o. Clonidine, dose to be adjusted to her  response to this medication by her blood pressure.  She also has headache  and therefore we started her on analgesic.  I will check the patient's renal  function.   COMMENTS:  The patient is a 77 year old lady who was admitted with right  upper extremity pain by the CV TS Service on May 06, 2006.  She  subsequently had an embolectomy done and postoperatively she was noted to be  a little bit lethargic and had a CT scan of the head done.  The report of  the CT scan is still pending and that was to rule out any incipient  intracranial hemorrhage since she has been anticoagulated.  In addition, we  were called to see her because of the fact that she has very high blood  pressures with systolic's of more than 200 and the diastolic's run about  100.  The patient complains of severe headaches.  By the time I saw her she  was more  alert when I saw her and denies any chest pain, shortness of  breath, fever or chills.  She also denies any nausea or vomiting, abdominal  pain.  The patient has had some low-grade temperatures up to 100 degrees  Fahrenheit and already cultures have been obtained by the primary team by  the time of my evaluation.   CURRENT MEDICATIONS:  The patient's current medications __________ were  reviewed as well as discharge medications on September 06, 2006.  She had  been on various antihypertensive medications including Clonidine 0.25 mg  b.i.d. at that time.  The patient cannot tell whether she had been taking  Clonidine or the last time she took any Clonidine at this time.  Her  medications she __________ on May 06, 2006 by the CV TS Team was noted.  Her social history and family history was also noted.   PHYSICAL EXAMINATION:  GENERAL:  She was in mild to moderate  painful  distress from a headache.  She is in no acute cardiopulmonary distress.  VITAL SIGNS:  The patient's blood pressure was 215/113 mmHg.  The patient's  blood pressure at 2 p.m. today was 140/70.  HEENT:  The patient had mild scleral pallor with icterus.  Pupils were  equal, round and reactive to light.  Oropharynx was moist.  NECK:  Was supple, no JVD.  LUNGS:  Were clear to auscultation bimanual exam.  CARDIAC:  The patient has a cardiac rhythm and was regular.  There was no  gallop.  She had a systolic murmur at the left lower sternal border which  was not radiating..  ABDOMEN:  Was obese and soft.  Bowel sounds present.  EXTREMITIES:  The patient had a bandage to the right upper extremity at the  site of her embolectomy surgery.  Radial pulses present and had good  capillary refill in the affected right upper extremity.  CNS:  The patient was alert and appropriate.  Moves all her extremities on  basic commands.   LABORATORY DATA:  CT scan of the head which is pending as mentioned above.  X-ray of the chest has been  ordered as well as cultures were also pending.  The patient's lab work from early this morning indicated WBC  count of 5.5,  hemoglobin  11.3, hematocrit 33.6.  MC 94, platelet count 175.  Her last pro-  time this morning was 15.1 with an INR of 1.3.  Her chemistries indicated  normal sodium, potassium and chloride.  Her CO2 was 34, glucose of 109, BUN  13, and creatinine 1.2.  Her creatinine was 1.5 from yesterday.  Calcium was  8.8.   IMPRESSION:  As noted above.   RECOMMENDATIONS:  As noted above.   PLAN:  1. Will followup on patient's x-ray of the chest as well as cultures and      CT scan.  2. Further recommendations will be made as lab data is processed.   Thank you very much for this consultation.      Jackie Plum, M.D.  Electronically Signed     GO/MEDQ  D:  05/07/2006  T:  05/07/2006  Job:  [id number]   cc:   Di Kindle. Edilia Bo, M.D.

## 2011-02-08 NOTE — Consult Note (Signed)
[name], [name]                  ACCOUNT NO.:  [id number]   MEDICAL RECORD NO.:  [id number]          PATIENT TYPE:  INP   LOCATION:  2303                         FACILITY:  [facility]   PHYSICIAN:  Maree Krabbe, M.D.DATE OF BIRTH:  Jan 05, 1933   DATE OF CONSULTATION:  08/23/2005  DATE OF DISCHARGE:                                   CONSULTATION   REASON FOR CONSULTATION:  Blood pressure control.   HISTORY OF PRESENT ILLNESS:  The patient is a 77 year old African-American  female, relatively healthy and functional woman who is a widow and lives  with one of her sons. She has a past history of hypertension not currently  on any medication except for aspirin. She developed acute abdominal and  right flank pain last night and presented the afternoon of August 22, 2005. She had a creatinine of 1.3. Normal white blood count and hemoglobin.  Unremarkable urinalysis. Chest x-ray was normal also except for gallstones  and chronically elevated left hemidiaphragm.   CT scan of the abdomen was done which showed a nonperfused right kidney  suggestive of acute embolic renal infarct. There was also a questionable  area of decreased segmental perfusion in the left kidney. Presenting blood  pressure was 120/74 at one in the afternoon. Through the day, it increased  to 200/107 at 9 p.m. and in the early morning at 230/157 at 2 in the morning  on the 1st. This was an acute rise. The patient's mental status was  initially good and she has deteriorated since with confusion and agitation.  She was initially treated in the emergency room with morphine and Phenergan  for pain and nausea and then had a normal saline bolus and several doses of  IV labetalol 15 milligrams in the early morning followed by diltiazem drip  started about 5 a.m. Currently, she is in ICU with a blood pressure which is  probably 150/80, although the accuracy is questionable.   She has been seen by the surgical service who felt  that she has an infarcted  right kidney with no indication for revascularization. She has been seen by  cardiology because she has atrial fibrillation with no known prior history  with a rate in the 80s to 90s on IV diltiazem.   PAST MEDICAL HISTORY:  1.  Positive for all her life. No medications for five to six years.  2.  No diabetes and no tobacco use.  3.  Questionable history of chronic renal insufficiency.   PAST SURGICAL HISTORY:  No prior surgeries.   SOCIAL HISTORY:  Lives with her son. She is widowed. No tobacco history. She  is very active. Questionable alcohol intake/possible abuse.   FAMILY HISTORY:  Mother died at 21 of diabetes. Father died at 86. Siblings  with questionable coronary disease.   REVIEW OF SYSTEMS:  No fevers, chills, or sweats according to the initial  history. The patient initially did not complain of any orthopnea, PND or  chest pain. GU:  She did not have any frequency, urgency, or dysuria.  NEUROLOGICAL:  No weakness or numbness.  GI:  No rectal bleeding, nausea or  vomiting.   PHYSICAL EXAMINATION:  VITAL SIGNS:  I took a manual systolic blood pressure  and the right arm was 150 and then the latest blood pressures by automated  cuff were 98/50 and then 10 minutes 190/150. These were in different sites.  GENERAL:  The patient is confused. She is trying to sit up. She is mumbling.  The only questions she responds verbally to is what is her name. She is  disoriented.  SKIN:  Without rash.  HEENT:  PERRL. EOMI. There is bilateral dense cataracts and I cannot see the  fundi as a result. There is some purulent drainage from the right eye. The  throat is clear and moist.  NECK:  Supple without bruits or JVD.  CHEST:  Clear throughout.  CARDIOVASCULAR:  Irregular rhythm without rub or gallop.  ABDOMEN:  Obese, soft, and nontender. She does have right CVA tenderness.  There are no abdominal bruits, masses or organomegaly.  EXTREMITIES:  No femoral  bruits. Good pulses in the feet. No edema,  ulceration or gangrene.  NEUROLOGICAL:  Moves arms and legs symmetrically. No focal deficits. No  obvious cranial nerves deficits.   LABORATORY DATA:  White blood count 7000, hemoglobin 13. Sodium 135,  potassium 3.4, BUN 15, creatinine 1.3. Liver enzymes normal with cardiac  enzymes normal. Urinalysis is essentially normal. INR of 1.1. Chest x-ray  unremarkable. CT scan result as above.   IMPRESSION:  1.  Acute right renal infarct with possible segmental left renal infarct.  2.  Acute hypertensive crisis due to renal infarction.  3.  Acute delirium probably hypertensive encephalopathy.  4.  Past history of hypertension.  5.  Atrial fibrillation.   RECOMMENDATIONS:  Control blood pressure to diastolic around 80 to 100 for  now. She is currently receiving IV diltiazem and if this does not work I  would use Nipride with an ACE inhibitor also. ACE inhibitors are thought to  be particular helpful possibly in hypertension related to acute renal  infarction. Certainly, she should be on ACE inhibitor if the diltiazem does  not work. This is little documented role for attempts at reperfusion and in  this elderly patient with unstable situation I would not suggest attempts at  thrombolysis. Renal function as usual in this setting if the contralateral  kidney is functioning adequately. It appears to be preserved.      Maree Krabbe, M.D.  Electronically Signed     RDS/MEDQ  D:  08/23/2005  T:  08/24/2005  Job:  [id number]

## 2011-02-08 NOTE — Consult Note (Signed)
NAMEAVIENDHA, AZBELL                  ACCOUNT NO.:  [id number]   MEDICAL RECORD NO.:  [id number]          PATIENT TYPE:  INP   LOCATION:  4729                         FACILITY:  [facility]   PHYSICIAN:  Willa Rough, M.D.     DATE OF BIRTH:  02-02-1933   DATE OF CONSULTATION:  08/23/2005  DATE OF DISCHARGE:                                   CONSULTATION   [name] is admitted with severe abdominal pain.  Studies show that she  appears to have an acute right kidney infarct.  Most likely it is embolic in  origin.  Along with this, she presents with atrial fibrillation of unknown  duration and very severe hypertension.  With control of her pain and high  dose blood pressure medicines, her blood pressure is coming under better  control.  Her atrial fibrillation rate is also controlled.  We are asked to  see the patient to help in general from the cardiac viewpoint.  There is a  specific question as to whether or not cardioversion is appropriate at this  time.  At this point, we do not have a lot of cardiac history.  There is an  EKG from 2001 which showed sinus rhythm.   ALLERGIES:  NO KNOWN DRUG ALLERGIES.   MEDICATIONS:  Patient took one or two aspirin daily and otherwise we are not  aware of any other medicines.   PAST MEDICAL HISTORY:  See the list below.   SOCIAL HISTORY:  The patient is widowed and lives with her son.  She is very  active by history. There is a question of some excess alcohol.  Has not  smoked by history.   FAMILY HISTORY:  There is no strong family history of coronary disease,  although the father died at age 25 of unknown causes.   REVIEW OF SYSTEMS:  The patient is somnolent due to pain medications. We are  hopeful that her CNS status remains stable.  There was no complaint of fever  or chills.  There were no obvious headaches or eye problems described.  There is a history of some two pillow orthopnea.  We do not appear to have a  history of congestive heart  failure.  There is no urinary problems and no GI  problems.  As mentioned, she has been quite active, therefore this is the  information was have and her review of systems otherwise is negative.   PHYSICAL EXAMINATION:  GENERAL APPEARANCE:  The patient, at this time, is  somnolent.  According to the nurse, she was awake and has received a lot of  pain medications and hopefully is just sleeping at this time.  In general,  she appears stable.  VITAL SIGNS:  Temperature is 97.3.  When she first admitted, her blood  pressure was recorded as high as 240/149.  It has been coming down steadily  and most recently in the last few minutes, her pressure is 180/84 on a high  dose labetalol drip.  Her atrial fibrillation rate is in the 80 to 90 range.  HEENT:  HEENT is  limited at this time.  NECK:  No obvious bruits.  LUNGS:  No respiratory instability.  She has a few mild basilar crackles in  the right lung.  ABDOMEN:  Her abdomen is obese, there are noncontributory masses or bruits.  GU AND RECTAL:  Deferred.  EXTREMITIES:  She has no significant peripheral edema.  NEUROLOGIC:  Her neurologic exam by me shows that she is somnolent at this  time.   A chest x-ray showed no congestive heart failure.  There was some  cardiomegaly.  She has an elevated left hemidiaphragm.  There are some  gallstones.  The pelvic CT showed a nonfunctioning right kidney with a  presumed embolic event.   Her BUN was 15 and creatinine 1.3.  This certainly would argue against  longstanding severe hypertension.  Hemoglobin is 13.7.  Her first troponin  is 0.03 and the second troponin is the same.  TSH is normal.   EKG reveals no acute change.  There is atrial fibrillation.   PROBLEMS:  1.  Very limited prior medical history available.  2.  High blood pressure all her life, according to her son, but no      medications that we are aware of.  3.  Acute onset of abdominal pain with a right kidney infarct.  She is       receiving a lot of pain medications.  She has been seen by vascular      surgery and this problem is to be followed medically at this point.  4.  Atrial fibrillation of unknown duration.  She was in sinus rhythm in      2001.  5.  Cardiomegaly on chest x-ray.  A 2-D echo is to be done.  6.  Severe hypertension.  This is probably due to a combination of      underlying hypertension and her acute renal infarct and severe pain.   From the cardiovascular viewpoint at this point, her atrial fibrillation  rate is controlled.  I would not proceed with cardioversion at this time.  Her anticoagulation would clearly be recommended for multiple reasons.  This  can be used, however, only when her mental status is clearly evaluated and  her blood pressure is controlled.  It is, of course, recommended to  aggressively treat her blood pressure but be careful not to make her  hypotensive.  I suspected her baseline is hypertensive.  We will follow but  there are no acute changes to be made at this time.           ______________________________  Willa Rough, M.D.     JK/MEDQ  D:  08/23/2005  T:  08/23/2005  Job:  [id number]

## 2011-02-08 NOTE — Discharge Summary (Signed)
[name], [name]                  ACCOUNT NO.:  [id number]   MEDICAL RECORD NO.:  [id number]          PATIENT TYPE:  INP   LOCATION:  2025                         FACILITY:  [facility]   PHYSICIAN:  Di Kindle. Edilia Bo, M.D.DATE OF BIRTH:  06-Dec-1932   DATE OF ADMISSION:  05/06/2006  DATE OF DISCHARGE:  05/13/2006                                 DISCHARGE SUMMARY   PRIMARY ADMITTING DIAGNOSIS:  Ischemic right upper extremity.   ADDITIONAL/DISCHARGE DIAGNOSES:  1. Ischemic right upper extremity secondary to acute arterial occlusion.  2. Refractory hypertension.  3. Acute encephalopathy, resolved.  4. History of coronary artery disease.  5. History of iron deficiency anemia.  6. History of atrial fibrillation.  7. History of left cerebrovascular accident.  8. Dyslipidemia.  9. History of infarct of right kidney.  10.History of congestive heart failure.   PROCEDURES PERFORMED:  Right brachial embolectomy with vein patch  angioplasty of the brachial artery.   HISTORY:  The patient is a 77 year old female who presented to the emergency  department on the date of this admission complaining of acute onset pain in  her right upper extremity.  She has a history of atrial fibrillation and has  been poorly compliant with Coumadin therapy.  Her INR on admission was 1.1.  She was noted on evaluation to have absent brachial, radial, and ulnar  pulses on the right and a CVTS consult was obtained.  The patient was seen  by Dr. Waverly Ferrari and it was felt that she had had an embolic event  to the right arm.  She was subsequently taken urgently to the operating room  for brachial embolectomy.   HOSPITAL COURSE:  [name] was taken to the operating room and underwent a  right brachial embolectomy as described in detail above.  She tolerated the  procedure well and was returned to the floor in stable condition  postoperatively.  She had a good return of radial and ulnar pulses.  She  was  started on heparin and Coumadin.  Late in the day on postop day one she  developed confusion and lethargy associated with uncontrolled hypertension.  She was seen in consultation by Dr. Thad Ranger for neurology and underwent a  CT scan of the brain which showed no evidence of acute hemorrhage.  Her  blood pressure was noted to be in the 220s systolic and she was treated with  hydralazine with some improvement.  She was also noted to have a low-grade  fever with no elevation of her white blood cell count or obvious source.  Her mental status seemed to return back to baseline with the control of her  blood pressure.  It was recommended that she undergo an MRI in order to  further evaluate what was seen as a questionable small left parietal CVA by  her CT scan.  However, she was unable to cooperate with an MRI study.  From  a neurological standpoint she has remained stable and no other acute changes  have occurred.  An internal medicine hospitalist consult was obtained for  assistance in managing her  hypertension.  She is now on a multiple  medication regimen and her blood pressure is much better controlled with  systolics ranging in the 120s-140s.  From a post surgical standpoint, her  right arm is somewhat swollen but there is no evidence of significant  hematoma.  Her right arm and hand are well perfused with good radial and  ulnar Doppler signals.  She has been appropriately anticoagulated and her  INR is now therapeutic at 2.  Her heparin has been discontinued.  Her other  labs have remained stable with a mild anemia, hemoglobin of 10.9, hematocrit  32.4.  She is otherwise doing well.  She is ambulating in the halls without  problem.  Her other vital signs have been stable and she has been afebrile.  She has had one episode of mild vaginal bleeding while on heparin and  patient states that this has happened in the past when she has been  anticoagulated.  The bleeding has resolved and  it was recommended by the  internal medicine service that she follow up with her GYN physician as an  outpatient.  She has been evaluated today, postop day seven, by Dr. Edilia Bo  and is felt that since she is stable and her blood pressure is well  controlled that she can be discharged home outpatient follow-up of her  surgical site.  Discharge medications are as follows.   DISCHARGE MEDICATIONS:  1. Coumadin 5 mg daily until PT and INR is drawn.  2. Aspirin 81 mg daily.  3. Simvastatin 40 mg nightly.  4. Cardizem CD 120 mg daily.  5. Clonidine 0.1 mg one-half tablet b.i.d.  6. Lopressor 50 mg b.i.d.  7. Lisinopril 40 mg daily.  8. Hydrochlorothiazide 50 mg daily.  9. Tylox one to two q.4h. p.r.n. for pain.   DISCHARGE INSTRUCTIONS:  She is asked to refrain from heavy lifting or  strenuous activity.  She may continue ambulating daily and using her  incentive spirometer.  She may shower daily and clean her incisions with  soap and water.  She will continue low fat, low sodium diet.   DISCHARGE FOLLOW-UP:  She will see Dr. Edilia Bo back in the office in three  weeks and our office will contact her with an appointment.  She is asked to  follow up at [facility] by Friday of this week to have a PT and INR drawn  and within the next one week to see her primary care physician to recheck  her blood pressure.  She will also follow up with Dr. Antionette Char at  [facility] on Monday, May 19, 2006 at 11 a.m. for evaluation of  bleeding.  She will contact our office in the interim if she experiences any  new problems or has questions.      Coral Ceo, P.A.      Di Kindle. Edilia Bo, M.D.  Electronically Signed    GC/MEDQ  D:  05/13/2006  T:  05/13/2006  Job:  [id number]   cc:   Casimiro Needle L. Thad Ranger, M.D.  Willis Modena, M.D.  CVTS

## 2011-02-08 NOTE — Op Note (Signed)
NAMESHAQUISHA, [name]                  ACCOUNT NO.:  [id number]   MEDICAL RECORD NO.:  [id number]          PATIENT TYPE:  INP   LOCATION:  2028                         FACILITY:  [facility]   PHYSICIAN:  Di Kindle. Edilia Bo, M.D.DATE OF BIRTH:  [birth year]   DATE OF PROCEDURE:  05/06/2006  DATE OF DISCHARGE:                                 OPERATIVE REPORT   PREOPERATIVE DIAGNOSIS:  Acute arterial occlusion of the right upper  extremity.   POSTOPERATIVE DIAGNOSIS:  Acute arterial occlusion of the right upper  extremity.   PROCEDURE:  Right brachial embolectomy with vein patch angioplasty of the  brachial artery.   SURGEON:  Dr. Edilia Bo.   ASSISTANT:  Charlesetta Garibaldi, PA.   ANESTHESIA:  General.   INDICATIONS:  This is a 77 year old woman who developed the sudden onset of  right upper extremity pain.  She had a history of atrial fibrillation in the  past, and had an absent brachial, radial and ulnar pulse in the right.  She  had apparently been off of her Coumadin and her INR on admission was 1.1.  It was felt that she had an embolic event to the right arm.  She was taken  urgently to the operating room for right brachial embolectomy.   TECHNIQUE:  The patient was taken to the operating room and received a  general anesthetic.  The right upper extremity was prepped and draped in the  usual sterile fashion.  A longitudinal incision was made over the brachial  artery in the right arm, and the brachial artery was dissected free.  It was  somewhat small.  The patient was heparinized.  The artery was opened  longitudinally and there was some clot present at this level, which was  retrieved.  The 3 Fogarty catheter was applied multiple times proximally and  a moderate amount of clot was retrieved.  Once no further clot was  retrieved, the artery was flushed with heparin and papaverine and clamped.  The catheter was then passed distally, and again, a moderate amount of clot  was retrieved.   Again, when no further clot was retrieved, the artery was  flushed with heparinized saline and clamped.  A segment of vein was taken  from the antecubital level and ligated at both ends, and then opened  longitudinally to be used as a vein patch.  This was sewn using continuous 6-  0 Prolene suture.  At the completion there was an excellent radial and ulnar  signal with the Doppler and brachial signal.  Hemostasis was obtained.  The  wound was closed with deep layer of 3-0 Vicryl and the skin closed with 4-0  Vicryl.  A sterile dressing was applied.  The patient tolerated the  procedure well, was transferred to the recovery room in satisfactory  condition.  All needle and sponge counts were correct.      Di Kindle. Edilia Bo, M.D.  Electronically Signed     CSD/MEDQ  D:  05/06/2006  T:  05/06/2006  Job:  [id number]

## 2011-02-08 NOTE — H&P (Signed)
**Note Bonnie via Obfuscation** Stephens, [name]                  ACCOUNT NO.:  [id number]   MEDICAL RECORD NO.:  [id number]          PATIENT TYPE:  INP   LOCATION:  1823                         FACILITY:  [facility]   PHYSICIAN:  Kela Millin, M.D.DATE OF BIRTH:  1933-03-02   DATE OF ADMISSION:  10/22/2005  DATE OF DISCHARGE:                                HISTORY & PHYSICAL   PRIMARY CARE PHYSICIAN:  Un[facility]igned [facility].   CHIEF COMPLAINT:  Rapid heart rate and elevated blood pressures.   HISTORY OF PRESENT ILLNESS:  The patient is a 77 year old black female, with  past medical history significant for atrial fibrillation, malignant  hypertension, CHF, infarcted right kidney, CVA and hyperlipidemia, who  presents with above-complaints. The patient reports that she went to  [facility] on the a.m. of presentation for a follow-up visit so she could  get her medications filled. During her visit, she was found to have a  markedly elevated blood pressure and a rapid heart rate. So she was given 5  milligrams of Lopressor IV and 200 milligrams of Labetalol as well as four  81 milligrams aspirin and 1 nitroglycerin tablet and sent to the ER to be  evaluated.   The patient states that she has been out of her medications for one month.  She denies chest pain or shortness of breath. It was noted on ER records  that the patient presented with chest pain but the patient denied any  complaints of chest pain at the time. I interviewed her. [name] also  denied shortness of breath, leg swelling, cough, melena, hematochezia,  nausea or vomiting, headaches, blurry vision, dysuria and no fevers.   At the time she was seen in the ER, her heart rate was 108 per ER physician.  She was also found to have a blood pressure of 235/143 initially in the ER  and she is admitted to the [facility] for further evaluation  and management.   PAST MEDICAL HISTORY:  1.  As stated above.  2.  History of urinary tract  infections.  3.  History of passage congestion of the liver.  4.  History of iron-deficiency anemia.   MEDICATIONS:  Coumadin only at this time. The patient is out of all her  other medications and from the empty bottles that she has with her the other  medications that she was supposed to be on are diltiazem 120 milligrams  daily, Metoprolol 100 milligrams p.o. b.i.d., Clonidine 0.2 milligrams p.o.  b.i.d., Pepcid 20 milligrams p.o. b.i.d., Pravastatin 200 milligrams p.o.  daily, Lisinopril/HCTZ 20/25 milligrams daily.   SOCIAL HISTORY:  She quit alcohol one year ago. She denies tobacco.   FAMILY HISTORY:  Her grandmother had heart disease and she has two sisters  who have hypertension.   ALLERGIES:  No known drug allergies.   REVIEW OF SYSTEMS:  As per HPI. Other review of systems are negative.   PHYSICAL EXAMINATION:  GENERAL:  The patient is an obese, elderly black  female in no apparent distress.  VITAL SIGNS:  Blood pressure 201/121, initially 235/43. Her temperature  97.4, pulse of 79, respiratory rate 16, O2 saturation of 100%.  HEENT:  PERRL. EOMI. Sclerae anicteric. Poor oral dentition. No oral  exudates.  NECK:  Supple. No adenopathy and no JVD. No carotid bruits appreciated.  LUNGS:  Decreased breath sounds at the bases. No crackles and no wheezes.  CARDIOVASCULAR:  Irregularly irregular. Normal S1 and S2.  ABDOMEN:  Soft. Bowel sounds present. Nontender and nondistended.  EXTREMITIES:  No cyanosis and no edema.  NEUROLOGICAL:  Alert and oriented x3. Cranial nerves II through XII grossly  intact. Nonfocal exam.   LABORATORY DATA:  Her chest x-ray stable cardiomegaly. No active lung  disease. White cell count is 4.2 with a hemoglobin of 14, hematocrit 40.6,  platelet count 232,000, neutrophil count 59%. Her INR is 1.9 and her PT is  22.1. The sodium is 137, potassium of 4.1, chloride is 103, and her CO2 is  26. Glucose is 100, BUN 11, creatinine 1.1, calcium 9.1,  total protein is  6.7 with an albumin of 2.9. AST is 21, ALT 13, alkaline phosphate is 64.  Total bilirubin is 0.8.   ASSESSMENT/PLAN:  1.  Hypertensive emergency/accelerated hypertension:  Secondary to      noncompliance. As discussed above, the patient has been out of her      medications for a month. I will start labetalol drip, monitor closely in      the stepdown unit. Serial cardiac enzymes and follow.  2.  Atrial fibrillation with rapid ventricular rhythm:  As above, the      patient has been noncompliant with medications. We will resume Cardizem      and metoprolol. Continue anticoagulation.  3.  Congestive heart failure, compensated:  Follow.  4.  Hyperlipidemia:  Continue Pravastatin.      Kela Millin, M.D.  Electronically Signed     ACV/MEDQ  D:  10/22/2005  T:  10/22/2005  Job:  [id number]   cc:   Dala Dock

## 2011-02-08 NOTE — H&P (Signed)
[name], [name]                  ACCOUNT NO.:  [id number]   MEDICAL RECORD NO.:  [id number]          PATIENT TYPE:  INP   LOCATION:  1827                         FACILITY:  [facility]   PHYSICIAN:  Di Kindle. Edilia Bo, M.D.DATE OF BIRTH:  11/29/1932   DATE OF ADMISSION:  05/06/2006  DATE OF DISCHARGE:                                HISTORY & PHYSICAL   REASON FOR ADMISSION:  Ischemic right upper extremity.   HISTORY:  This is a pleasant 77 year old female who developed the sudden  onset of pain in her right upper extremity at approximately 7 a.m.  Of note,  this patient is a very poor historian and the majority of her past history  is obtained through the records.  She is a patient at [facility] but does  not know the name of her primary care physician.  She is not aware if she  has a cardiologist.  Of note, she was admitted in January of this year with  the rapid heart rate and elevated blood pressure and was followed at that  time by Dr. Donnalee Curry.   HISTORY:  This patient does have a history of atrial fibrillation in the  past and had been on Coumadin, although she tells me that she believes this  medication was recently stopped.  Her past medical history is otherwise  significant for hypertension, atrial fibrillation, hyperlipidemia, and  congestive heart failure.  She also has a history of iron deficiency anemia  and a history of urinary tract infections in the past.  She denies any  history of diabetes, or history of previous myocardial infarction.   MEDICATIONS:  She does not know her medications, nor does her daughter.  They are trying to obtain these from her son.  According to the old records  in January the patient was on Coumadin at that time and was supposed to be  on:  1. Diltiazem 120 mg p.o. daily.  2. Metoprolol 100 mg p.o. b.i.d.  3. Clonidine 0.2 mg p.o. b.i.d.  4. Pepcid 20 mg p.o. b.i.d.  5. Pravastatin 200 mg p.o. daily.  6.  Lisinopril/hydrochlorothiazide 20/25 mg p.o. daily.   ALLERGIES:  No known drug allergies.   SOCIAL HISTORY:  She is widowed.  She has three living children.  She has  one daughter who lives here [place].  She does not use tobacco.   FAMILY HISTORY:  Her mother had heart disease and she had two sisters with  hypertension.  She is unaware of any history of premature cardiovascular  disease.   REVIEW OF SYSTEMS:  She has had no recent fever, chills, weight loss or  weight gain.  CARDIAC:  She has had no chest pressure, chest pain,  palpitations or significant dyspnea on exertion.  PULMONARY:  She has had no  bronchitis, asthma, or wheezing.  GI: She has had no dysuria or frequency.  GU:  She has had no dysuria or frequency.  GI:  She has had no change in her  bowel habits and no history of peptic ulcer disease that she is aware of.  Review of systems is otherwise unremarkable.  She has had no history of  stroke, TIAs, expressive or receptive aphasia that she is aware of.   PHYSICAL EXAMINATION:  VITAL SIGNS:  Temperature 97.7, blood pressure  149/70, heart rate is 51.  She is in a junctional rhythm by EKG.  I do not  detect any carotid bruits.  LUNGS:  Clear bilaterally to auscultation.  CARDIAC:  She has a regular rate, slightly irregular rhythm.  ABDOMEN:  Soft and nontender.  She is obese and it is difficult to assess  her femoral pulses.  EXTREMITIES:  She does have brisk Doppler signals in the dorsalis pedis  position bilaterally.  On the right side she has a palpable axillary pulse  with no brachial, radial, or ulnar pulse on the right.  The right hand is  slightly cool.  She does have a markedly dampened monophasic radial signal  with the Doppler on the right.  On the left side she has palpable brachial  pulse and a brisk radial and ulnar signal with the Doppler.   IMPRESSION:  This patient presents with an acute arterial occlusion of the  right upper extremity likely  related to an embolic event.  Her INR is 1.1  and it may be that she has been off of her Coumadin.   Will plan to proceed urgently with right brachial embolectomy.  She will  need a 2-D echocardiogram postoperatively and will likely need to be  restarted on her Coumadin.  I have discussed the indications for the  procedure and the potential complications including but not limited to  continued ischemia, bleeding, and wound healing problems.  All of her  questions were answered and she was agreeable to proceed.      Di Kindle. Edilia Bo, M.D.  Electronically Signed     CSD/MEDQ  D:  05/06/2006  T:  05/06/2006  Job:  [id number]   cc:   [facility]

## 2011-02-08 NOTE — Discharge Summary (Signed)
NAMECARIN, [name]                  ACCOUNT NO.:  [id number]   MEDICAL RECORD NO.:  [id number]          PATIENT TYPE:  INP   LOCATION:  2002                         FACILITY:  [facility]   PHYSICIAN:  Jonna L. Robb Matar, M.D.DATE OF BIRTH:  [birth year]   DATE OF ADMISSION:  08/22/2005  DATE OF DISCHARGE:  09/03/2005                                 DISCHARGE SUMMARY   FINAL DIAGNOSES:  1.  Infarcted right kidney.  2.  New atrial fibrillation.  3.  Malignant hypertension.  4.  Hypertensive encephalopathy.  5.  Left frontal embolic cerebrovascular accident.  6.  Urinary tract infection.  7.  Congestive heart failure.  8.  Chronic passive congestion of the liver.  9.  Iron-deficiency anemia.  10. Hyperlipidemia.   RELEVANT LABORATORY TESTS:  Creatinine went from 2.5 to 0.8.  INR was 2.3 on  September 03, 2005.  BNP was 843.  Iron-deficiency anemia, hemoglobin was  10.8.  Total cholesterol 225, LDL 153.   ALLERGIES:  None.   CODE STATUS:  Full.   CONSULTATIONS:  1.  Janetta Hora. Darrick Penna, M.D.  2.  Willa Rough, M.D.  3.  Maree Krabbe, M.D.   This 77 year old hypertensive female had not been taking medication.  She  developed a sudden severe pain in the right flank has remained constant.   ADMISSION PHYSICAL EXAMINATION:  VITAL SIGNS:  Blood pressure was 207/107.  GENERAL:  She was in pain from the right side.  HEART:  Irregularly irregular heart rate.  Mild tachycardia.  MUSCULOSKELETAL:  Right sided tenderness.  No peripheral edema.   Initial BUN was 18, creatinine 1.3.  CT scan showed a nonfunctioning right  kidney and possible nonfunctioning lower pole of the left kidney secondary  to thromboembolic disease.  The patient had atrial fibrillation at 112.   HOSPITAL COURSE:  The patient was evaluated by vascular surgery and renal  and it was felt that the patient did not require surgery.  The patient's  creatinine peaked at 2.5 and then gradually improved.  For her atrial  fibrillation, the patient was treated with intravenous Cardizem.  Echocardiography did not show intracardiac thrombosis and she was  anticoagulated on Coumadin.  For the patient's hypertension, this remained  uncontrolled and severe and required large doses of medication to control.  Her blood pressure was very labile.  At one point, she had mental status  changes.  MRI scan showed a small left frontal embolic CVA but there was  also some components of hypertensive encephalopathy as it would improve when  her blood pressures improved.  She had a small UTI which was treated with  Cipro.  She had elevated transaminases.  Ultrasound did not show any  problems and that gradually improved somewhat on her own.  Right kidney  showed 6% of functioning compared to the left.  The patient received  physical, occupational, and speech therapy and gradually improved from the  CVA, although she remained still slightly slow.  Echocardiography showed  normal EF and LVH, so she probably had some diastolic dysfunction.  She had  some iron-deficiency  anemia and will require some workup for this but it was  not done during her hospital stay as she needs to still recover from her  CVA.   DISPOSITION:  The patient will be discharged on the following:  1.  Low salt diet.  2.  Lisinopril HCT 20/25 every day.  3.  Diltiazem 120 every day.  4.  Metoprolol 100 b.i.d.  5.  Clonidine 0.2 b.i.d.  6.  Coumadin 5 every day.  7.  Famotidine 20 b.i.d.  8.  Pravastatin 20 every day.  9.  She had some mild conjunctivitis in her right eye, so she will take      tobramycin 0.3% one drop four times a day for two weeks.  10. Neopolydex 0.1% one drop q.i.d. for two weeks.   1.  She is in the process of getting a PCP, so she is to see this person in      a week.  2.  She will be seen in the [facility] in one week.  She has been      instructed that she must get blood pressures and INRs at least once      monthly for  forever.  She is to take a multivitamin with iron.  3.  She will require an outpatient workup of her iron-deficiency anemia.      Jonna L. Robb Matar, M.D.  Electronically Signed     JLB/MEDQ  D:  09/06/2005  T:  09/06/2005  Job:  [id number]   cc:   [facility]   PCP, unassigned

## 2011-02-14 ENCOUNTER — Encounter (INDEPENDENT_AMBULATORY_CARE_PROVIDER_SITE_OTHER): Payer: Medicare Other | Admitting: *Deleted

## 2011-02-14 ENCOUNTER — Other Ambulatory Visit: Payer: Self-pay | Admitting: *Deleted

## 2011-02-14 DIAGNOSIS — I70219 Atherosclerosis of native arteries of extremities with intermittent claudication, unspecified extremity: Secondary | ICD-10-CM

## 2011-02-14 DIAGNOSIS — R0989 Other specified symptoms and signs involving the circulatory and respiratory systems: Secondary | ICD-10-CM

## 2011-02-14 DIAGNOSIS — M79609 Pain in unspecified limb: Secondary | ICD-10-CM

## 2011-02-14 DIAGNOSIS — M79606 Pain in leg, unspecified: Secondary | ICD-10-CM

## 2011-02-14 DIAGNOSIS — I739 Peripheral vascular disease, unspecified: Secondary | ICD-10-CM

## 2011-02-15 ENCOUNTER — Encounter: Payer: Self-pay | Admitting: Cardiology

## 2011-02-21 ENCOUNTER — Ambulatory Visit (INDEPENDENT_AMBULATORY_CARE_PROVIDER_SITE_OTHER): Payer: Medicare Other | Admitting: *Deleted

## 2011-02-21 DIAGNOSIS — Z7901 Long term (current) use of anticoagulants: Secondary | ICD-10-CM

## 2011-02-21 DIAGNOSIS — I4891 Unspecified atrial fibrillation: Secondary | ICD-10-CM

## 2011-02-25 ENCOUNTER — Telehealth: Payer: Self-pay | Admitting: Cardiology

## 2011-02-25 NOTE — Telephone Encounter (Signed)
I talked with daughter in law about pt's medication. Confirmed with her spironolactone and KCL on hold.

## 2011-02-25 NOTE — Telephone Encounter (Signed)
Pt daughter in law rtn your call

## 2011-03-06 ENCOUNTER — Telehealth: Payer: Self-pay | Admitting: Cardiology

## 2011-03-06 NOTE — Telephone Encounter (Signed)
Pt's dtr calling re results of "leg test" pt told her we were going to call in some med to treat her hip and the pharmacy doesn't have any meds from [place], since this info didn't make since she was the results called to her

## 2011-03-21 ENCOUNTER — Ambulatory Visit (INDEPENDENT_AMBULATORY_CARE_PROVIDER_SITE_OTHER): Payer: Medicare Other | Admitting: *Deleted

## 2011-03-21 DIAGNOSIS — Z7901 Long term (current) use of anticoagulants: Secondary | ICD-10-CM

## 2011-03-21 DIAGNOSIS — I4891 Unspecified atrial fibrillation: Secondary | ICD-10-CM

## 2011-03-26 ENCOUNTER — Other Ambulatory Visit: Payer: Self-pay | Admitting: Cardiology

## 2011-04-18 ENCOUNTER — Encounter: Payer: Medicare Other | Admitting: *Deleted

## 2011-04-18 ENCOUNTER — Telehealth: Payer: Self-pay | Admitting: *Deleted

## 2011-04-18 ENCOUNTER — Ambulatory Visit (INDEPENDENT_AMBULATORY_CARE_PROVIDER_SITE_OTHER): Payer: Medicare Other | Admitting: *Deleted

## 2011-04-18 ENCOUNTER — Ambulatory Visit: Payer: Medicare Other | Admitting: Cardiology

## 2011-04-18 ENCOUNTER — Ambulatory Visit (INDEPENDENT_AMBULATORY_CARE_PROVIDER_SITE_OTHER): Payer: Medicare Other | Admitting: Cardiology

## 2011-04-18 ENCOUNTER — Encounter: Payer: Self-pay | Admitting: Cardiology

## 2011-04-18 DIAGNOSIS — E785 Hyperlipidemia, unspecified: Secondary | ICD-10-CM

## 2011-04-18 DIAGNOSIS — Z7901 Long term (current) use of anticoagulants: Secondary | ICD-10-CM

## 2011-04-18 DIAGNOSIS — I4891 Unspecified atrial fibrillation: Secondary | ICD-10-CM

## 2011-04-18 DIAGNOSIS — I509 Heart failure, unspecified: Secondary | ICD-10-CM

## 2011-04-18 DIAGNOSIS — I10 Essential (primary) hypertension: Secondary | ICD-10-CM

## 2011-04-18 DIAGNOSIS — I5032 Chronic diastolic (congestive) heart failure: Secondary | ICD-10-CM

## 2011-04-18 DIAGNOSIS — I4892 Unspecified atrial flutter: Secondary | ICD-10-CM

## 2011-04-18 DIAGNOSIS — R0602 Shortness of breath: Secondary | ICD-10-CM

## 2011-04-18 DIAGNOSIS — I2510 Atherosclerotic heart disease of native coronary artery without angina pectoris: Secondary | ICD-10-CM

## 2011-04-18 DIAGNOSIS — I739 Peripheral vascular disease, unspecified: Secondary | ICD-10-CM

## 2011-04-18 LAB — LIPID PANEL
HDL: 39.5 mg/dL (ref >39.00)
Total CHOL/HDL Ratio: 3
Triglycerides: 53 mg/dL (ref 0.0–149.0)
VLDL: 10.6 mg/dL (ref 0.0–40.0)

## 2011-04-18 LAB — HEPATIC FUNCTION PANEL
Albumin: 3.9 g/dL (ref 3.5–5.2)
Total Bilirubin: 1.2 mg/dL (ref 0.3–1.2)

## 2011-04-18 LAB — BASIC METABOLIC PANEL
CO2: 31 mEq/L (ref 19–32)
Calcium: 9.3 mg/dL (ref 8.4–10.5)
GFR: 45.82 mL/min — ABNORMAL LOW (ref >60.00)
Glucose, Bld: 101 mg/dL — ABNORMAL HIGH (ref 70–99)
Potassium: 3.3 mEq/L — ABNORMAL LOW (ref 3.5–5.1)
Sodium: 140 mEq/L (ref 135–145)

## 2011-04-18 MED ORDER — ATORVASTATIN CALCIUM 80 MG PO TABS: 80.0000 mg | ORAL_TABLET | Freq: Every day | ORAL | Status: DC

## 2011-04-18 NOTE — Assessment & Plan Note (Signed)
Probable popliteal artery occlusion on right with reconstitution via collaterals.  Only mildly decreased ABI on right.  Not tissue-threatening.  Vague symptoms only.  Will follow, repeat peripheral dopplers in 5/13.   

## 2011-04-18 NOTE — Telephone Encounter (Signed)
Notes Recorded by Jacqlyn Krauss, RN on 04/18/2011 at 3:44 PM I talked with pt's daughter in law, Agustin Cree. She is aware of Dr Alford Highland recommendations and verbalized understanding. Pt will return 04/29/11 for BMP. Notes Recorded by Marca Ancona, MD on 04/18/2011 at 3:26 PM Decrease Lasix to 40 qam, 20 qpm. Increase lisinopril to 20 mg daily. BMET in 10 days.

## 2011-04-18 NOTE — Assessment & Plan Note (Signed)
BP mildly elevated.  She is taking lisinopril 10 mg daily.  Will check BMET today and increase lisinopril if K and creatinine are ok.

## 2011-04-18 NOTE — Patient Instructions (Signed)
Fasting lab today--lipid profile/liver profile /BMP/BNP 414.00  427.31  272.4  Schedule an appointment with Dr Shirlee Latch for 4 months.

## 2011-04-18 NOTE — Assessment & Plan Note (Signed)
No significant volume overload on exam.  Continue current dose of Lasix.

## 2011-04-18 NOTE — Progress Notes (Signed)
Dr. Rudean Curt  77 yo with history of atrial fibrillation, diastolic CHF, HTN, and cardioembolic events to the right kidney, brain, and right arm returns for followup of exertional shortness of breath.  Echo in 2011 showed moderate LVH, EF 60-65%, and severe diastolic dysfunction with moderate biatrial enlargement.  Lexiscan myoview in 2011 showed a possible small anterior MI with mild peri-infarct ischemia versus breast artifact.   Since last appointment, she has been symptomatically stable.  She is able to walk around the house without significant exertional dyspnea.  She is not getting out much because it is so hot.  No chest pain.  She has PAD with an occluded popliteal artery and reconstitution via collaterals by arterial dopplers.  She is a bit vague but does not seem to have much leg pain.  She is doing better with her medications with the help of a pill box.  BP is mildly elevated today at 142/98.  Weight is down 3 lbs.    Labs (8/11): BNP 212 Labs (9/11): K 4.3, creatinine 1.0 Labs (10/11): K 4.1, creatinine 1.1, LDL 143, HDL 42 Labs (11/11): BNP 342, K 3.8, creatinine  Labs (12/11): K 4.2, creatinine 1.2, BNP 339, LDL 97, HDL 38, LFTs normal Labs (1/61): LDL 97, HDL 38 Labs (5/12): K 3.8, creatinine 1.3  Allergies (verified):  No Known Drug Allergies  Past Medical History: 1. Resistant HTN. Patient states that this has been poorly controlled for years.  2. Diastolic CHF:  Echo (7/11): EF 60-65%, moderate LVH, severe diastolic dysfunction.   3. Iron deficiency anemia. 4. Atrial fibrillation: Chronic. Has had cardioembolic right renal infarct in 12/06 and cardioembolic event to the right arm requiring right brachial embolectomy.  Both events occurred while subtherapeutic on coumadin.  5. Obese.  6. Left frontal embolic cerebrovascular accident. 7. Dyslipidemia. 8. Lexiscan myoview (9/11): EF 76%, normal wall motion, possible small anterior MI with mild peri-infarct ischemia but  cannot rule out breast attenuation.  9. PAD: peripheral arterial dopplers (5/12) with right mid popliteal artery occlusion with reconstitution of arteries below the knees by collaterals.  Only mild reduction in ABI on the right (not tissue-threatening).   Family History: Father died at 75 but she is not sure why.  Multiple family members with resistant HTN.   Social History: She is widowed.  She has three living children.  She has one daughter who lives here [place].  She does not use tobacco or drink ETOH.    Review of Systems        All systems reviewed and negative except as per HPI.   Current Outpatient Prescriptions  Medication Sig Dispense Refill  . amLODipine (NORVASC) 10 MG tablet TAKE ONE TABLET BY MOUTH EVERY DAY  30 tablet  10  . aspirin 81 MG tablet Take 81 mg by mouth daily.        Marland Kitchen atorvastatin (LIPITOR) 80 MG tablet Take 1 tablet (80 mg total) by mouth daily.  30 tablet  6  . carvedilol (COREG) 25 MG tablet Take 1 tablet (25 mg total) by mouth 2 (two) times daily.  60 tablet  6  . cloNIDine (CATAPRES) 0.2 MG tablet Take 0.2 mg by mouth 2 (two) times daily.        . furosemide (LASIX) 40 MG tablet Take 40 mg by mouth 2 (two) times daily.       Marland Kitchen warfarin (COUMADIN) 4 MG tablet Take 1 tablet (4 mg total) by mouth as directed.  30 tablet  1  .  DISCONTD: atorvastatin (LIPITOR) 80 MG tablet Take 80 mg by mouth daily.        Marland Kitchen DISCONTD: lisinopril (PRINIVIL,ZESTRIL) 20 MG tablet TAKE ONE TABLET BY MOUTH EVERY DAY  30 tablet  6  . lisinopril (PRINIVIL,ZESTRIL) 20 MG tablet Take 1/2 tablet daily        BP 142/98  Pulse 74  Resp 16  Ht 5\' 4"  (1.626 m)  Wt 214 lb (97.07 kg)  BMI 36.73 kg/m2 General:  Obese elderly female in no apparent distress Neck:  Neck supple, JVP 7-8. No masses, thyromegaly or abnormal cervical nodes. Lungs:  Clear bilaterally to auscultation and percussion. Heart:  Non-displaced PMI, chest non-tender; irregular rate and rhythm, S1, S2 without rubs  or gallops. 1/6 systolic murmur along the sternal border.  Carotid upstroke normal, no bruit. Difficult to feel pedal pulses. No edema.  Abdomen:  Bowel sounds positive; abdomen soft and non-tender without masses, organomegaly, or hernias noted. No hepatosplenomegaly. Extremities:  No clubbing or cyanosis. Neurologic:  Alert and oriented x 3. Psych:  Normal affect.

## 2011-04-18 NOTE — Assessment & Plan Note (Signed)
Myoview in 2011 with possible small anterior MI with peri-infarct ischemia.  No chest pain.  Medically manage, control blood pressure.  Goal LDL < 70.  She is on ASA 81 mg daily, Coreg, ACEI, and statin.

## 2011-04-18 NOTE — Assessment & Plan Note (Signed)
Check lipids/LFTs today.  Goal LDL < 70 with PAD, suspected CAD.

## 2011-04-29 ENCOUNTER — Other Ambulatory Visit (INDEPENDENT_AMBULATORY_CARE_PROVIDER_SITE_OTHER): Payer: Medicare Other | Admitting: *Deleted

## 2011-04-29 DIAGNOSIS — I4892 Unspecified atrial flutter: Secondary | ICD-10-CM

## 2011-04-29 DIAGNOSIS — I509 Heart failure, unspecified: Secondary | ICD-10-CM

## 2011-04-29 LAB — BASIC METABOLIC PANEL
BUN: 23 mg/dL (ref 6–23)
CO2: 29 mEq/L (ref 19–32)
Calcium: 9.1 mg/dL (ref 8.4–10.5)
Creatinine, Ser: 1.4 mg/dL — ABNORMAL HIGH (ref 0.4–1.2)

## 2011-05-08 ENCOUNTER — Emergency Department (HOSPITAL_COMMUNITY)
Admission: EM | Admit: 2011-05-08 | Discharge: 2011-05-08 | Disposition: A | Discharge status: Home / Self Care | Payer: Medicare Other | Attending: Emergency Medicine | Admitting: Emergency Medicine

## 2011-05-08 ENCOUNTER — Emergency Department (HOSPITAL_COMMUNITY): Payer: Medicare Other

## 2011-05-08 DIAGNOSIS — IMO0002 Reserved for concepts with insufficient information to code with codable children: Secondary | ICD-10-CM | POA: Insufficient documentation

## 2011-05-08 DIAGNOSIS — Z8673 Personal history of transient ischemic attack (TIA), and cerebral infarction without residual deficits: Secondary | ICD-10-CM | POA: Insufficient documentation

## 2011-05-08 DIAGNOSIS — M542 Cervicalgia: Secondary | ICD-10-CM | POA: Insufficient documentation

## 2011-05-08 DIAGNOSIS — I4891 Unspecified atrial fibrillation: Secondary | ICD-10-CM | POA: Insufficient documentation

## 2011-05-08 DIAGNOSIS — E785 Hyperlipidemia, unspecified: Secondary | ICD-10-CM | POA: Insufficient documentation

## 2011-05-08 DIAGNOSIS — I10 Essential (primary) hypertension: Secondary | ICD-10-CM | POA: Insufficient documentation

## 2011-05-08 DIAGNOSIS — M79609 Pain in unspecified limb: Secondary | ICD-10-CM | POA: Insufficient documentation

## 2011-05-08 DIAGNOSIS — F411 Generalized anxiety disorder: Secondary | ICD-10-CM | POA: Insufficient documentation

## 2011-05-08 DIAGNOSIS — I509 Heart failure, unspecified: Secondary | ICD-10-CM | POA: Insufficient documentation

## 2011-05-08 LAB — COMPREHENSIVE METABOLIC PANEL
ALT: 16 U/L (ref 0–35)
AST: 20 U/L (ref 0–37)
Albumin: 3.4 g/dL — ABNORMAL LOW (ref 3.5–5.2)
Alkaline Phosphatase: 116 U/L (ref 39–117)
Calcium: 9.8 mg/dL (ref 8.4–10.5)
GFR calc Af Amer: >60 mL/min (ref >60)
Glucose, Bld: 106 mg/dL — ABNORMAL HIGH (ref 70–99)
Potassium: 3.2 mEq/L — ABNORMAL LOW (ref 3.5–5.1)
Sodium: 138 mEq/L (ref 135–145)
Total Protein: 8.3 g/dL (ref 6.0–8.3)

## 2011-05-08 LAB — CBC
Hemoglobin: 12.5 g/dL (ref 12.0–15.0)
MCHC: 33.3 g/dL (ref 30.0–36.0)
Platelets: 160 10*3/uL (ref 150–400)

## 2011-05-08 LAB — DIFFERENTIAL
Basophils Absolute: 0 10*3/uL (ref 0.0–0.1)
Basophils Relative: 0 % (ref 0–1)
Eosinophils Absolute: 0 10*3/uL (ref 0.0–0.7)
Monocytes Absolute: 0.7 10*3/uL (ref 0.1–1.0)
Neutro Abs: 4.3 10*3/uL (ref 1.7–7.7)
Neutrophils Relative %: 66 % (ref 43–77)

## 2011-05-08 LAB — PROTIME-INR: INR: 3.24 — ABNORMAL HIGH (ref 0.00–1.49)

## 2011-05-16 ENCOUNTER — Ambulatory Visit (INDEPENDENT_AMBULATORY_CARE_PROVIDER_SITE_OTHER): Payer: Medicare Other | Admitting: *Deleted

## 2011-05-16 DIAGNOSIS — Z7901 Long term (current) use of anticoagulants: Secondary | ICD-10-CM

## 2011-05-16 DIAGNOSIS — I4891 Unspecified atrial fibrillation: Secondary | ICD-10-CM

## 2011-05-23 ENCOUNTER — Other Ambulatory Visit: Payer: Self-pay | Admitting: Cardiology

## 2011-05-29 ENCOUNTER — Other Ambulatory Visit (HOSPITAL_COMMUNITY): Payer: Self-pay | Admitting: Family Medicine

## 2011-05-29 DIAGNOSIS — Z1231 Encounter for screening mammogram for malignant neoplasm of breast: Secondary | ICD-10-CM

## 2011-05-29 DIAGNOSIS — Z780 Asymptomatic menopausal state: Secondary | ICD-10-CM

## 2011-05-30 ENCOUNTER — Ambulatory Visit (INDEPENDENT_AMBULATORY_CARE_PROVIDER_SITE_OTHER): Payer: Medicare Other | Admitting: *Deleted

## 2011-05-30 DIAGNOSIS — I4891 Unspecified atrial fibrillation: Secondary | ICD-10-CM

## 2011-05-30 DIAGNOSIS — Z7901 Long term (current) use of anticoagulants: Secondary | ICD-10-CM

## 2011-05-30 LAB — POCT INR: INR: 2.5

## 2011-06-13 ENCOUNTER — Ambulatory Visit (HOSPITAL_COMMUNITY)
Admission: RE | Admit: 2011-06-13 | Discharge: 2011-06-13 | Disposition: A | Discharge status: Home / Self Care | Payer: Medicare Other | Source: Ambulatory Visit | Attending: Family Medicine | Admitting: Family Medicine

## 2011-06-13 DIAGNOSIS — Z780 Asymptomatic menopausal state: Secondary | ICD-10-CM

## 2011-06-13 DIAGNOSIS — Z1231 Encounter for screening mammogram for malignant neoplasm of breast: Secondary | ICD-10-CM

## 2011-06-13 DIAGNOSIS — Z13820 Encounter for screening for osteoporosis: Secondary | ICD-10-CM | POA: Insufficient documentation

## 2011-06-25 LAB — URINALYSIS, ROUTINE W REFLEX MICROSCOPIC
Glucose, UA: NEGATIVE
Leukocytes, UA: NEGATIVE
Nitrite: NEGATIVE
Specific Gravity, Urine: 1.012
pH: 5.5

## 2011-06-25 LAB — POCT I-STAT, CHEM 8
Calcium, Ion: 1.15
Chloride: 103
Glucose, Bld: 104 — ABNORMAL HIGH
HCT: 43
TCO2: 28

## 2011-06-25 LAB — DIFFERENTIAL
Lymphocytes Relative: 23
Lymphs Abs: 1.8
Monocytes Absolute: 0.3
Monocytes Relative: 4
Neutro Abs: 5.5
Neutrophils Relative %: 72

## 2011-06-25 LAB — B-NATRIURETIC PEPTIDE (CONVERTED LAB): Pro B Natriuretic peptide (BNP): 229 — ABNORMAL HIGH

## 2011-06-25 LAB — CBC
Hemoglobin: 13.9
MCHC: 32.8
RBC: 4.56
WBC: 7.6

## 2011-06-25 LAB — OCCULT BLOOD X 1 CARD TO LAB, STOOL: Fecal Occult Bld: NEGATIVE

## 2011-06-25 LAB — URINE MICROSCOPIC-ADD ON

## 2011-06-25 LAB — POCT CARDIAC MARKERS: Myoglobin, poc: 96.5

## 2011-06-25 LAB — PROTIME-INR: INR: 2.6 — ABNORMAL HIGH

## 2011-06-27 ENCOUNTER — Ambulatory Visit (INDEPENDENT_AMBULATORY_CARE_PROVIDER_SITE_OTHER): Payer: Medicare Other | Admitting: *Deleted

## 2011-06-27 ENCOUNTER — Other Ambulatory Visit: Payer: Self-pay | Admitting: *Deleted

## 2011-06-27 DIAGNOSIS — I509 Heart failure, unspecified: Secondary | ICD-10-CM

## 2011-06-27 DIAGNOSIS — I4891 Unspecified atrial fibrillation: Secondary | ICD-10-CM

## 2011-06-27 DIAGNOSIS — Z7901 Long term (current) use of anticoagulants: Secondary | ICD-10-CM

## 2011-06-27 DIAGNOSIS — I4892 Unspecified atrial flutter: Secondary | ICD-10-CM

## 2011-06-27 LAB — POCT INR: INR: 3.9

## 2011-06-27 MED ORDER — FUROSEMIDE 40 MG PO TABS: 40.0000 mg | ORAL_TABLET | Freq: Two times a day (BID) | ORAL | Status: DC

## 2011-07-11 ENCOUNTER — Ambulatory Visit (INDEPENDENT_AMBULATORY_CARE_PROVIDER_SITE_OTHER): Payer: Medicare Other | Admitting: *Deleted

## 2011-07-11 DIAGNOSIS — I4891 Unspecified atrial fibrillation: Secondary | ICD-10-CM

## 2011-07-11 DIAGNOSIS — Z7901 Long term (current) use of anticoagulants: Secondary | ICD-10-CM

## 2011-07-11 LAB — POCT INR: INR: 1.7

## 2011-07-22 ENCOUNTER — Ambulatory Visit (INDEPENDENT_AMBULATORY_CARE_PROVIDER_SITE_OTHER): Payer: Medicare Other | Admitting: *Deleted

## 2011-07-22 DIAGNOSIS — Z7901 Long term (current) use of anticoagulants: Secondary | ICD-10-CM

## 2011-07-22 DIAGNOSIS — I4891 Unspecified atrial fibrillation: Secondary | ICD-10-CM

## 2011-08-05 ENCOUNTER — Ambulatory Visit (INDEPENDENT_AMBULATORY_CARE_PROVIDER_SITE_OTHER): Payer: Medicare Other | Admitting: *Deleted

## 2011-08-05 DIAGNOSIS — Z7901 Long term (current) use of anticoagulants: Secondary | ICD-10-CM

## 2011-08-05 DIAGNOSIS — I4891 Unspecified atrial fibrillation: Secondary | ICD-10-CM

## 2011-08-05 LAB — POCT INR: INR: 2.1

## 2011-08-12 ENCOUNTER — Ambulatory Visit (INDEPENDENT_AMBULATORY_CARE_PROVIDER_SITE_OTHER): Payer: Medicare Other | Admitting: Cardiology

## 2011-08-12 ENCOUNTER — Encounter: Payer: Self-pay | Admitting: Cardiology

## 2011-08-12 DIAGNOSIS — I10 Essential (primary) hypertension: Secondary | ICD-10-CM

## 2011-08-12 DIAGNOSIS — E785 Hyperlipidemia, unspecified: Secondary | ICD-10-CM

## 2011-08-12 DIAGNOSIS — I5032 Chronic diastolic (congestive) heart failure: Secondary | ICD-10-CM

## 2011-08-12 DIAGNOSIS — I4891 Unspecified atrial fibrillation: Secondary | ICD-10-CM

## 2011-08-12 DIAGNOSIS — I509 Heart failure, unspecified: Secondary | ICD-10-CM

## 2011-08-12 DIAGNOSIS — I2510 Atherosclerotic heart disease of native coronary artery without angina pectoris: Secondary | ICD-10-CM

## 2011-08-12 DIAGNOSIS — I739 Peripheral vascular disease, unspecified: Secondary | ICD-10-CM

## 2011-08-12 DIAGNOSIS — R0602 Shortness of breath: Secondary | ICD-10-CM

## 2011-08-12 MED ORDER — LISINOPRIL 40 MG PO TABS: 40.0000 mg | ORAL_TABLET | Freq: Every day | ORAL | Status: DC

## 2011-08-12 NOTE — Assessment & Plan Note (Signed)
Volume stable, continue current Lasix dosing. Will check BNP today.   Followup with PA in 3 months and me in 6 months.

## 2011-08-12 NOTE — Assessment & Plan Note (Signed)
BP still high.  Will increase lisinopril to 40 mg daily today.  BMET in 2 wks.

## 2011-08-12 NOTE — Progress Notes (Signed)
PCP: Dr. Rudean Curt [facility])  77 yo with history of atrial fibrillation, diastolic CHF, HTN, and cardioembolic events to the right kidney, brain, and right arm returns for cardiology followup.  Echo in 2011 showed moderate LVH, EF 60-65%, and severe diastolic dysfunction with moderate biatrial enlargement.  Lexiscan myoview in 2011 showed a possible small anterior MI with mild peri-infarct ischemia versus breast artifact.   Since last appointment, she has been symptomatically stable.  She is able to walk around the house without significant exertional dyspnea.  She is not getting out much.  No chest pain.  She has PAD with an occluded popliteal artery and reconstitution via collaterals by arterial dopplers.  She denies claudication-type symptoms.  She is doing better with her medications with the help of a pill box.  Weight is down 3 lbs.  She is still hypertensive.    ECG: Atrial fibrillation at 72, lateral T wave flattening  Labs (8/11): BNP 212 Labs (9/11): K 4.3, creatinine 1.0 Labs (10/11): K 4.1, creatinine 1.1, LDL 143, HDL 42 Labs (11/11): BNP 342, K 3.8, creatinine  Labs (12/11): K 4.2, creatinine 1.2, BNP 339, LDL 97, HDL 38, LFTs normal Labs (4/09): LDL 97, HDL 38 Labs (5/12): K 3.8, creatinine 1.3 Labs (7/12): LDL 68, HDL 39, BNP 265 Labs (8/12): K 3.2, creatinine 1.07  Allergies (verified):  No Known Drug Allergies  Past Medical History: 1. Resistant HTN. Patient states that this has been poorly controlled for years.  2. Diastolic CHF:  Echo (7/11): EF 60-65%, moderate LVH, severe diastolic dysfunction.   3. Iron deficiency anemia. 4. Atrial fibrillation: Chronic. Has had cardioembolic right renal infarct in 12/06 and cardioembolic event to the right arm requiring right brachial embolectomy.  Both events occurred while subtherapeutic on coumadin.  5. Obese.  6. Left frontal embolic cerebrovascular accident. 7. Dyslipidemia. 8. Possible CAD: Lexiscan myoview (9/11):  EF 76%, normal wall motion, possible small anterior MI with mild peri-infarct ischemia but cannot rule out breast attenuation.  9. PAD: peripheral arterial dopplers (5/12) with right mid popliteal artery occlusion with reconstitution of arteries below the knees by collaterals.  Only mild reduction in ABI on the right (not tissue-threatening).   Family History: Father died at 43 but she is not sure why.  Multiple family members with resistant HTN.   Social History: She is widowed.  She has three living children.  She has one daughter who lives here [place].  She does not use tobacco or drink ETOH.    Review of Systems        All systems reviewed and negative except as per HPI.   Current Outpatient Prescriptions  Medication Sig Dispense Refill  . amLODipine (NORVASC) 10 MG tablet TAKE ONE TABLET BY MOUTH EVERY DAY  30 tablet  10  . aspirin 81 MG tablet Take 81 mg by mouth daily.        Marland Kitchen atorvastatin (LIPITOR) 80 MG tablet Take 1 tablet (80 mg total) by mouth daily.  30 tablet  6  . carvedilol (COREG) 25 MG tablet Take 1 tablet (25 mg total) by mouth 2 (two) times daily.  60 tablet  6  . cloNIDine (CATAPRES) 0.2 MG tablet TAKE ONE TABLET BY MOUTH TWICE DAILY  60 tablet  3  . warfarin (COUMADIN) 4 MG tablet Take 1 tablet (4 mg total) by mouth as directed.  30 tablet  1  . DISCONTD: furosemide (LASIX) 40 MG tablet Take 1 tablet (40 mg total) by mouth 2 (two) times  daily.  60 tablet  5  . DISCONTD: lisinopril (PRINIVIL,ZESTRIL) 20 MG tablet Take 1 tablet (20 mg total) by mouth daily.      . furosemide (LASIX) 40 MG tablet 40mg  in the morning and 20mg  in the afternoon      . lisinopril (PRINIVIL,ZESTRIL) 40 MG tablet Take 1 tablet (40 mg total) by mouth daily.  30 tablet  6    BP 138/100  Pulse 60  Ht 5\' 4"  (1.626 m)  Wt 95.709 kg (211 lb)  BMI 36.22 kg/m2 General:  Obese elderly female in no apparent distress Neck:  Neck supple, JVP 8 cm. No masses, thyromegaly or abnormal cervical  nodes. Lungs:  Clear bilaterally to auscultation and percussion. Heart:  Non-displaced PMI, chest non-tender; irregular rate and rhythm, S1, S2 without rubs or gallops. 1/6 systolic murmur along the sternal border.  Carotid upstroke normal, no bruit. Difficult to feel pedal pulses. No edema.  Abdomen:  Bowel sounds positive; abdomen soft and non-tender without masses, organomegaly, or hernias noted. No hepatosplenomegaly. Extremities:  No clubbing or cyanosis. Neurologic:  Alert and oriented x 3. Psych:  Normal affect.

## 2011-08-12 NOTE — Assessment & Plan Note (Signed)
Stable, good rate control.  On coumadin.  If she ever needs to come off coumadin, she should be bridged by Lovenox or heparin gtt when INR<2 given multiple cardioembolic events.

## 2011-08-12 NOTE — Patient Instructions (Addendum)
Increase lisinopril to 40mg  daily. You can take two 20mg  tablets daily at the same time.  Your physician recommends that you return for lab work in: 2 weeks --BMP/BNP 401.9  428.32  414.01   Your physician recommends that you schedule a follow-up appointment in: 3 months with Tereso Newcomer ,PA,c  Your physician wants you to follow-up in: 6 months with Dr Shirlee Latch. (May 2013). You will receive a reminder letter in the mail two months in advance. If you don't receive a letter, please call our office to schedule the follow-up appointment.

## 2011-08-12 NOTE — Assessment & Plan Note (Signed)
Myoview in 2011 with possible small anterior MI with peri-infarct ischemia.  No chest pain.  Medically manage, control blood pressure.  Goal LDL < 70.  She is on ASA 81 mg daily, Coreg, ACEI, and statin.  

## 2011-08-12 NOTE — Assessment & Plan Note (Signed)
Probable popliteal artery occlusion on right with reconstitution via collaterals.  Only mildly decreased ABI on right.  Not tissue-threatening.  Vague symptoms only.  Will follow, repeat peripheral dopplers in 5/13.

## 2011-08-12 NOTE — Assessment & Plan Note (Signed)
Lipids at goal (LDL < 70) in 7/12.

## 2011-08-13 ENCOUNTER — Other Ambulatory Visit: Payer: Self-pay | Admitting: Pharmacist

## 2011-08-13 MED ORDER — WARFARIN SODIUM 4 MG PO TABS: 4.0000 mg | ORAL_TABLET | ORAL | Status: DC

## 2011-08-26 ENCOUNTER — Other Ambulatory Visit (INDEPENDENT_AMBULATORY_CARE_PROVIDER_SITE_OTHER): Payer: Medicare Other | Admitting: *Deleted

## 2011-08-26 ENCOUNTER — Other Ambulatory Visit: Payer: Medicare Other | Admitting: *Deleted

## 2011-08-26 ENCOUNTER — Ambulatory Visit (INDEPENDENT_AMBULATORY_CARE_PROVIDER_SITE_OTHER): Payer: Medicare Other | Admitting: *Deleted

## 2011-08-26 ENCOUNTER — Other Ambulatory Visit: Payer: Self-pay | Admitting: Cardiology

## 2011-08-26 DIAGNOSIS — Z7901 Long term (current) use of anticoagulants: Secondary | ICD-10-CM

## 2011-08-26 DIAGNOSIS — I509 Heart failure, unspecified: Secondary | ICD-10-CM

## 2011-08-26 DIAGNOSIS — I10 Essential (primary) hypertension: Secondary | ICD-10-CM

## 2011-08-26 DIAGNOSIS — I5032 Chronic diastolic (congestive) heart failure: Secondary | ICD-10-CM

## 2011-08-26 DIAGNOSIS — I2510 Atherosclerotic heart disease of native coronary artery without angina pectoris: Secondary | ICD-10-CM

## 2011-08-26 DIAGNOSIS — R0602 Shortness of breath: Secondary | ICD-10-CM

## 2011-08-26 DIAGNOSIS — I4891 Unspecified atrial fibrillation: Secondary | ICD-10-CM

## 2011-08-26 LAB — POCT INR: INR: 2.3

## 2011-08-26 LAB — BASIC METABOLIC PANEL
Calcium: 9 mg/dL (ref 8.4–10.5)
Creatinine, Ser: 1.3 mg/dL — ABNORMAL HIGH (ref 0.4–1.2)
GFR: 52.5 mL/min — ABNORMAL LOW (ref >60.00)
Glucose, Bld: 88 mg/dL (ref 70–99)
Sodium: 140 mEq/L (ref 135–145)

## 2011-09-23 ENCOUNTER — Ambulatory Visit (INDEPENDENT_AMBULATORY_CARE_PROVIDER_SITE_OTHER): Payer: Medicare Other | Admitting: *Deleted

## 2011-09-23 DIAGNOSIS — Z7901 Long term (current) use of anticoagulants: Secondary | ICD-10-CM

## 2011-09-23 DIAGNOSIS — I4891 Unspecified atrial fibrillation: Secondary | ICD-10-CM

## 2011-09-23 LAB — POCT INR: INR: 3.8

## 2011-09-26 ENCOUNTER — Other Ambulatory Visit: Payer: Self-pay | Admitting: Cardiology

## 2011-10-07 ENCOUNTER — Ambulatory Visit (INDEPENDENT_AMBULATORY_CARE_PROVIDER_SITE_OTHER): Payer: Medicare Other | Admitting: *Deleted

## 2011-10-07 DIAGNOSIS — I4891 Unspecified atrial fibrillation: Secondary | ICD-10-CM

## 2011-10-07 DIAGNOSIS — Z7901 Long term (current) use of anticoagulants: Secondary | ICD-10-CM

## 2011-10-07 LAB — POCT INR: INR: 2.8

## 2011-10-25 ENCOUNTER — Other Ambulatory Visit: Payer: Self-pay | Admitting: Cardiology

## 2011-10-28 ENCOUNTER — Ambulatory Visit (INDEPENDENT_AMBULATORY_CARE_PROVIDER_SITE_OTHER): Payer: Medicare Other | Admitting: Pharmacist

## 2011-10-28 DIAGNOSIS — I4891 Unspecified atrial fibrillation: Secondary | ICD-10-CM

## 2011-10-28 DIAGNOSIS — Z7901 Long term (current) use of anticoagulants: Secondary | ICD-10-CM

## 2011-11-11 ENCOUNTER — Ambulatory Visit: Payer: Medicare Other | Admitting: Physician Assistant

## 2011-11-13 ENCOUNTER — Ambulatory Visit: Payer: Medicare Other | Admitting: Physician Assistant

## 2011-11-15 ENCOUNTER — Encounter: Payer: Self-pay | Admitting: Physician Assistant

## 2011-11-15 ENCOUNTER — Ambulatory Visit (INDEPENDENT_AMBULATORY_CARE_PROVIDER_SITE_OTHER): Payer: Medicare Other | Admitting: Physician Assistant

## 2011-11-15 DIAGNOSIS — I509 Heart failure, unspecified: Secondary | ICD-10-CM

## 2011-11-15 DIAGNOSIS — I739 Peripheral vascular disease, unspecified: Secondary | ICD-10-CM

## 2011-11-15 DIAGNOSIS — R0602 Shortness of breath: Secondary | ICD-10-CM

## 2011-11-15 DIAGNOSIS — I2510 Atherosclerotic heart disease of native coronary artery without angina pectoris: Secondary | ICD-10-CM

## 2011-11-15 DIAGNOSIS — I5032 Chronic diastolic (congestive) heart failure: Secondary | ICD-10-CM

## 2011-11-15 DIAGNOSIS — I4891 Unspecified atrial fibrillation: Secondary | ICD-10-CM

## 2011-11-15 DIAGNOSIS — I779 Disorder of arteries and arterioles, unspecified: Secondary | ICD-10-CM

## 2011-11-15 DIAGNOSIS — I10 Essential (primary) hypertension: Secondary | ICD-10-CM

## 2011-11-15 LAB — BASIC METABOLIC PANEL
BUN: 26 mg/dL — ABNORMAL HIGH (ref 6–23)
Chloride: 101 mEq/L (ref 96–112)
Creatinine, Ser: 1.3 mg/dL — ABNORMAL HIGH (ref 0.4–1.2)
Glucose, Bld: 95 mg/dL (ref 70–99)
Potassium: 4 mEq/L (ref 3.5–5.1)

## 2011-11-15 LAB — BRAIN NATRIURETIC PEPTIDE: Pro B Natriuretic peptide (BNP): 230 pg/mL — ABNORMAL HIGH (ref 0.0–100.0)

## 2011-11-15 MED ORDER — ATORVASTATIN CALCIUM 80 MG PO TABS: 80.0000 mg | ORAL_TABLET | Freq: Every day | ORAL | Status: DC

## 2011-11-15 MED ORDER — AMLODIPINE BESYLATE 10 MG PO TABS: 10.0000 mg | ORAL_TABLET | Freq: Every day | ORAL | Status: DC

## 2011-11-15 MED ORDER — CLONIDINE HCL 0.3 MG PO TABS: 0.3000 mg | ORAL_TABLET | Freq: Two times a day (BID) | ORAL | Status: DC

## 2011-11-15 MED ORDER — CARVEDILOL 25 MG PO TABS: 25.0000 mg | ORAL_TABLET | Freq: Two times a day (BID) | ORAL | Status: DC

## 2011-11-15 MED ORDER — LISINOPRIL 40 MG PO TABS: 40.0000 mg | ORAL_TABLET | Freq: Every day | ORAL | Status: DC

## 2011-11-15 MED ORDER — FUROSEMIDE 40 MG PO TABS: ORAL_TABLET | ORAL | Status: DC

## 2011-11-15 NOTE — Assessment & Plan Note (Signed)
No angina.  Continue aspirin and statin. 

## 2011-11-15 NOTE — Assessment & Plan Note (Signed)
No claudication.  She will be set up for lower extremity Dopplers at her next visit.

## 2011-11-15 NOTE — Assessment & Plan Note (Signed)
Volume appears stable.  She is a difficult historian.  Her weight is stable.  Check a followup basic metabolic panel a BNP today.  If her BNP is significantly changed from last visit, we will adjust her Lasix accordingly.

## 2011-11-15 NOTE — Patient Instructions (Addendum)
Your physician recommends that you schedule a follow-up appointment in: 3 months with Dr Shirlee Latch Your physician recommends that you have lab work today Your physician has recommended you make the following change in your medication: INCREASE Clonidine to 0.3 mg twice daily after you finish the current bottle of Clonidine 0.2 mg

## 2011-11-15 NOTE — Assessment & Plan Note (Signed)
Rate controlled.  Lab Results  Component Value Date   INR 2.6 10/28/2011   INR 2.8 10/07/2011   INR 3.8 09/23/2011

## 2011-11-15 NOTE — Assessment & Plan Note (Signed)
Blood pressure appears better.  It is still uncontrolled.  I will adjust her clonidine to 0.3 mg twice a day.  Followup with Dr. Marca Ancona in 3 months.

## 2011-11-15 NOTE — Progress Notes (Signed)
[address] [place], [place]  [address] Phone: [phone] Fax:  [phone]  Date:  11/15/2011   Name:  [name]       DOB:  August 23, 1933 MRN:  [id number]  PCP:  Dala Dock  Primary Cardiologist:  Dr. Marca Ancona  Primary Electrophysiologist:  None    History of Present Illness: [name] is a 78 y.o. female who presents for follow up.    She has a history of atrial fibrillation, diastolic CHF, HTN, and cardioembolic events to the right kidney, brain, and right arm returns. Echo in 2011 showed moderate LVH, EF 60-65%, and severe diastolic dysfunction with moderate biatrial enlargement. Lexiscan myoview in 2011 showed a possible small anterior MI with mild peri-infarct ischemia versus breast artifact.  She was asymptomatic and treated medically.    Labs (8/11): BNP 212  Labs (9/11): K 4.3, creatinine 1.0  Labs (10/11): K 4.1, creatinine 1.1, LDL 143, HDL 42  Labs (11/11): BNP 696, K 3.8, creatinine  Labs (12/11): K 4.2, creatinine 1.2, BNP 339, LDL 97, HDL 38, LFTs normal  Labs (3/12): LDL 97, HDL 38  Labs (5/12): K 3.8, creatinine 1.3  Labs (7/12): LDL 68, HDL 39, BNP 265  Labs (8/12): K 3.2, creatinine 1.07  Labs (12/12): K 3.9, creatinine 1.3  She was last seen by Dr. Shirlee Latch 11/12.  At that time, she was stable.  He increased her lisinopril for uncontrolled blood pressure.  She returns today for follow up.  She is somewhat of a difficult historian.  She denies chest pain, shortness of breath or syncope.  She is not certain about her medications.  She sleeps on 2-3 pillows chronically.  This has not changed.  It is not clear whether or not she's had episodes of PND.  She denies edema.  She has had some congestion lately.  She admits to taking all of her medications.  There has been no apparent change in her medications since she was last seen in this office, per her report.  She denies symptoms consistent with claudication.  Past Medical History:   1. Resistant HTN. Patient states that this has been poorly controlled for years.  2. Diastolic CHF: Echo (7/11): EF 29-52%, moderate LVH, severe diastolic dysfunction.  3. Iron deficiency anemia.  4. Atrial fibrillation: Chronic. Has had cardioembolic right renal infarct in 12/06 and cardioembolic event to the right arm requiring right brachial embolectomy. Both events occurred while subtherapeutic on coumadin.  5. Obese.  6. Left frontal embolic cerebrovascular accident.  7. Dyslipidemia.  8. Possible CAD: Lexiscan myoview (9/11): EF 76%, normal wall motion, possible small anterior MI with mild peri-infarct ischemia but cannot rule out breast attenuation.  9. PAD: peripheral arterial dopplers (5/12) with right mid popliteal artery occlusion with reconstitution of arteries below the knees by collaterals. Only mild reduction in ABI on the right (not tissue-threatening).     Current Outpatient Prescriptions  Medication Sig Dispense Refill  . amLODipine (NORVASC) 10 MG tablet TAKE ONE TABLET BY MOUTH EVERY DAY  30 tablet  10  . aspirin 81 MG tablet Take 81 mg by mouth daily.        Marland Kitchen atorvastatin (LIPITOR) 80 MG tablet Take 1 tablet (80 mg total) by mouth daily.  30 tablet  6  . carvedilol (COREG) 25 MG tablet TAKE ONE TABLET TWICE DAILY  60 tablet  7  . cloNIDine (CATAPRES) 0.2 MG tablet TAKE ONE TABLET TWICE DAILY  60 tablet  6  .  furosemide (LASIX) 40 MG tablet 40mg  in the morning and 20mg  in the afternoon      . lisinopril (PRINIVIL,ZESTRIL) 40 MG tablet Take 1 tablet (40 mg total) by mouth daily.  30 tablet  6  . warfarin (COUMADIN) 4 MG tablet Take as directed by anticoagulation clinic  30 tablet  3    Allergies: No Known Allergies  History  Substance Use Topics  . Smoking status: Never Smoker   . Smokeless tobacco: Not on file  . Alcohol Use: No     Family History:  Father died at 40 but she is not sure why. Multiple family members with resistant HTN.   Social History:    She is widowed. She has three living children. She has one daughter who lives here [place]. She does not use tobacco or drink ETOH.   ROS:  Please see the history of present illness.    All other systems reviewed and negative.   PHYSICAL EXAM: VS:  BP 152/96  Pulse 60  Ht 5\' 4"  (1.626 m)  Wt 211 lb (95.709 kg)  BMI 36.22 kg/m2  SpO2 99% Repeat blood pressure by me on the right: 144/70  Well nourished, well developed, in no acute distress HEENT: normal Neck: no JVD Cardiac:  normal S1, S2; Irregularly irregular, 1/6 systolic murmur at the left lower sternal border Lungs:  clear to auscultation bilaterally, no wheezing, rhonchi or rales Abd: soft, nontender, no hepatomegaly Ext: no edema Skin: warm and dry Neuro:  CNs 2-12 intact, no focal abnormalities noted  EKG:  Atrial fibrillation, heart rate 66, normal axis, nonspecific ST-T wave changes, No change from prior  ASSESSMENT AND PLAN:

## 2011-11-18 ENCOUNTER — Ambulatory Visit: Payer: Medicare Other | Admitting: Cardiology

## 2011-11-25 ENCOUNTER — Ambulatory Visit (INDEPENDENT_AMBULATORY_CARE_PROVIDER_SITE_OTHER): Payer: Medicare Other | Admitting: Pharmacist

## 2011-11-25 DIAGNOSIS — I4891 Unspecified atrial fibrillation: Secondary | ICD-10-CM

## 2011-11-25 DIAGNOSIS — Z7901 Long term (current) use of anticoagulants: Secondary | ICD-10-CM

## 2011-11-25 LAB — POCT INR: INR: 3.3

## 2011-12-18 ENCOUNTER — Ambulatory Visit (INDEPENDENT_AMBULATORY_CARE_PROVIDER_SITE_OTHER): Payer: Medicare Other | Admitting: *Deleted

## 2011-12-18 DIAGNOSIS — I4891 Unspecified atrial fibrillation: Secondary | ICD-10-CM

## 2011-12-18 DIAGNOSIS — Z7901 Long term (current) use of anticoagulants: Secondary | ICD-10-CM

## 2011-12-18 LAB — POCT INR: INR: 2.3

## 2012-01-06 ENCOUNTER — Ambulatory Visit (INDEPENDENT_AMBULATORY_CARE_PROVIDER_SITE_OTHER): Payer: Medicare Other | Admitting: Pharmacist

## 2012-01-06 DIAGNOSIS — I4891 Unspecified atrial fibrillation: Secondary | ICD-10-CM

## 2012-01-06 DIAGNOSIS — Z7901 Long term (current) use of anticoagulants: Secondary | ICD-10-CM

## 2012-01-06 LAB — POCT INR: INR: 2.5

## 2012-02-03 ENCOUNTER — Ambulatory Visit (INDEPENDENT_AMBULATORY_CARE_PROVIDER_SITE_OTHER): Payer: Medicare Other | Admitting: Pharmacist

## 2012-02-03 DIAGNOSIS — Z7901 Long term (current) use of anticoagulants: Secondary | ICD-10-CM

## 2012-02-03 DIAGNOSIS — I4891 Unspecified atrial fibrillation: Secondary | ICD-10-CM

## 2012-02-03 LAB — POCT INR: INR: 2.9

## 2012-02-12 ENCOUNTER — Ambulatory Visit (INDEPENDENT_AMBULATORY_CARE_PROVIDER_SITE_OTHER): Payer: Medicare Other | Admitting: Cardiology

## 2012-02-12 ENCOUNTER — Telehealth: Payer: Self-pay | Admitting: *Deleted

## 2012-02-12 ENCOUNTER — Encounter: Payer: Self-pay | Admitting: Cardiology

## 2012-02-12 VITALS — BP 114/78 | Ht 64.0 in | Wt 212.0 lb

## 2012-02-12 DIAGNOSIS — I779 Disorder of arteries and arterioles, unspecified: Secondary | ICD-10-CM

## 2012-02-12 DIAGNOSIS — I10 Essential (primary) hypertension: Secondary | ICD-10-CM

## 2012-02-12 DIAGNOSIS — E785 Hyperlipidemia, unspecified: Secondary | ICD-10-CM

## 2012-02-12 DIAGNOSIS — I739 Peripheral vascular disease, unspecified: Secondary | ICD-10-CM

## 2012-02-12 DIAGNOSIS — I5032 Chronic diastolic (congestive) heart failure: Secondary | ICD-10-CM

## 2012-02-12 DIAGNOSIS — I2510 Atherosclerotic heart disease of native coronary artery without angina pectoris: Secondary | ICD-10-CM

## 2012-02-12 DIAGNOSIS — I4891 Unspecified atrial fibrillation: Secondary | ICD-10-CM

## 2012-02-12 DIAGNOSIS — I509 Heart failure, unspecified: Secondary | ICD-10-CM

## 2012-02-12 MED ORDER — CARVEDILOL 12.5 MG PO TABS: 12.5000 mg | ORAL_TABLET | Freq: Two times a day (BID) | ORAL | Status: DC

## 2012-02-12 MED ORDER — FUROSEMIDE 40 MG PO TABS: 40.0000 mg | ORAL_TABLET | Freq: Two times a day (BID) | ORAL | Status: DC

## 2012-02-12 NOTE — Patient Instructions (Addendum)
Your physician has recommended you make the following change in your medication:  Increase furosemide ( lasix) to 40mg  in the morning and 40 mg in the afternoon  Stop aspirin Decrease Carvedilol to 12.5 mg 1 tablet twice a day.  Your physician recommends that you return for lab work in: 1 week for a cmp, and fasting cholesterol.  Your physician has requested that you have a lower  arterial duplex. This test is an ultrasound of the arteries in the legs or arms. It looks at arterial blood flow in the legs and arms. Allow one hour for Lower and Upper Arterial scans. There are no restrictions or special instructions  Your physician wants you to follow-up in: 1 month with Tereso Newcomer. You will receive a reminder letter in the mail two months in advance. If you don't receive a letter, please call our office to schedule the follow-up appointment.

## 2012-02-12 NOTE — Telephone Encounter (Signed)
I spoke with pt and her son about decreasing carvedilol to 12.5 mg bid due to decreased heart rate. I left a detailed voicemail message on grandaughter, Agustin Cree, phone regarding this change. Darlene fills her grandmother's medication box for the week. Prescription for the reduced dosage of Carvedilol called in to her pharmacy and pt is aware. Appt. Made for 1 month with Tereso Newcomer [facility] & pt is aware.   New instruction sheet mailed out to pt. Patient and son are both aware of these changes. Mylo Red RN

## 2012-02-13 ENCOUNTER — Telehealth: Payer: Self-pay | Admitting: Cardiology

## 2012-02-13 NOTE — Assessment & Plan Note (Addendum)
Patient is bradycardic today.  She has permanent atrial fibrillation.  She is subjectively "weak."  I am going to decrease her Coreg to 12.5 mg bid.  Continue coumadin.  She will need to be bridged if she has to come off coumadin for procedure given history of thromboembolism.

## 2012-02-13 NOTE — Assessment & Plan Note (Signed)
Goal LDL < 70.  Will get lipids/LFTs.

## 2012-02-13 NOTE — Assessment & Plan Note (Signed)
[name] appears volume overloaded on exam.  Symptomatically, it is hard to tell if there is a difference. I am going to have her increase Lasix to 40 mg bid. We will get BMET/BNP in 1 week and have her followup in a month.

## 2012-02-13 NOTE — Telephone Encounter (Signed)
Re explained medication/ spelling of drug/ told her to cut the 25 mg tablet in half and take the half 12.5 mg twice a day 12 hours apart. Explained several times and made her repeat back, told her to call with any med questions.

## 2012-02-13 NOTE — Assessment & Plan Note (Signed)
No pedal ulcers.  She is a difficult historian but does seem to have some leg symptoms.  I will get peripheral arterial dopplers to followup on her known PAD .

## 2012-02-13 NOTE — Assessment & Plan Note (Signed)
No angina.  Continue statin.  She can stop aspirin as she is also on warfarin.

## 2012-02-13 NOTE — Telephone Encounter (Signed)
Please return call to patient daughter Darrin Luis [phone]  Agustin Cree needs medication dosage instructions, she can be reached at [phone].

## 2012-02-13 NOTE — Progress Notes (Signed)
PCP: Dr. Rudean Curt [facility])  78 yo with history of atrial fibrillation, diastolic CHF, HTN, and cardioembolic events to the right kidney, brain, and right arm returns for cardiology followup.  Echo in 2011 showed moderate LVH, EF 60-65%, and severe diastolic dysfunction with moderate biatrial enlargement.  Lexiscan myoview in 2011 showed a possible small anterior MI with mild peri-infarct ischemia versus breast artifact.  She has PAD with an occluded popliteal artery and reconstitution via collaterals by arterial dopplers.   [name] is a difficult historian.  She seems to be stable symptomatically.  She describes some exertional dyspnea when she "works hard," but in general she seems to get around her house ok.  Weight is up 1 lb compared to prior appointment.  She is not very active.  No chest pain.  She reports some pain in her legs but is somewhat unclear as to whether this is exertional or not.  No PND or orthopnea.  She is bradycardic today. No lightheadedness or syncope.  ECG: Atrial fibrillation, 45 bpm  Labs (8/11): BNP 212 Labs (9/11): K 4.3, creatinine 1.0 Labs (10/11): K 4.1, creatinine 1.1, LDL 143, HDL 42 Labs (11/11): BNP 342, K 3.8, creatinine  Labs (12/11): K 4.2, creatinine 1.2, BNP 339, LDL 97, HDL 38, LFTs normal Labs (1/61): LDL 97, HDL 38 Labs (5/12): K 3.8, creatinine 1.3 Labs (7/12): LDL 68, HDL 39, BNP 265 Labs (8/12): K 3.2, creatinine 1.07 Labs (2/13): K 4, creatinine 1.3, BNP 230  Allergies (verified):  No Known Drug Allergies  Past Medical History: 1. Resistant HTN. Patient states that this has been poorly controlled for years.  2. Diastolic CHF:  Echo (7/11): EF 60-65%, moderate LVH, severe diastolic dysfunction.   3. Iron deficiency anemia. 4. Atrial fibrillation: Chronic. Has had cardioembolic right renal infarct in 12/06 and cardioembolic event to the right arm requiring right brachial embolectomy.  Both events occurred while subtherapeutic on  coumadin.  5. Obese.  6. Left frontal embolic cerebrovascular accident. 7. Dyslipidemia. 8. Possible CAD: Lexiscan myoview (9/11): EF 76%, normal wall motion, possible small anterior MI with mild peri-infarct ischemia but cannot rule out breast attenuation.  9. PAD: peripheral arterial dopplers (5/12) with right mid popliteal artery occlusion with reconstitution of arteries below the knees by collaterals.  Only mild reduction in ABI on the right (not tissue-threatening).   Family History: Father died at 83 but she is not sure why.  Multiple family members with resistant HTN.   Social History: She is widowed.  She has three living children.  She has one daughter who lives here [place].  She does not use tobacco or drink ETOH.    Review of Systems        All systems reviewed and negative except as per HPI.   Current Outpatient Prescriptions  Medication Sig Dispense Refill  . amLODipine (NORVASC) 10 MG tablet Take 1 tablet (10 mg total) by mouth daily.  30 tablet  4  . atorvastatin (LIPITOR) 80 MG tablet Take 1 tablet (80 mg total) by mouth daily.  30 tablet  4  . carvedilol (COREG) 12.5 MG tablet Take 1 tablet (12.5 mg total) by mouth 2 (two) times daily with a meal.  60 tablet  6  . cloNIDine (CATAPRES) 0.3 MG tablet Take 1 tablet (0.3 mg total) by mouth 2 (two) times daily.  60 tablet  6  . furosemide (LASIX) 40 MG tablet Take 1 tablet (40 mg total) by mouth 2 (two) times daily.  60 tablet  11  . lisinopril (PRINIVIL,ZESTRIL) 40 MG tablet Take 1 tablet (40 mg total) by mouth daily.  30 tablet  4  . warfarin (COUMADIN) 4 MG tablet Take as directed by anticoagulation clinic  30 tablet  3    BP 114/78  Ht 5\' 4"  (1.626 m)  Wt 212 lb (96.163 kg)  BMI 36.39 kg/m2 General:  Obese elderly female in no apparent distress Neck:  Neck supple, JVP 8-9 cm. No masses, thyromegaly or abnormal cervical nodes. Lungs:  Clear bilaterally to auscultation and percussion. Heart:  Non-displaced PMI,  chest non-tender; bradycardic, irregular rate and rhythm, S1, S2 without rubs or gallops. 1/6 systolic murmur along the sternal border.  Carotid upstroke normal, no bruit. Difficult to feel pedal pulses. 1+ ankle edema bilaterally.  Abdomen:  Bowel sounds positive; abdomen soft and non-tender without masses, organomegaly, or hernias noted. No hepatosplenomegaly. Extremities:  No clubbing or cyanosis. Neurologic:  Alert and oriented x 3. Psych:  Normal affect.

## 2012-02-18 ENCOUNTER — Ambulatory Visit (INDEPENDENT_AMBULATORY_CARE_PROVIDER_SITE_OTHER): Payer: Medicare Other | Admitting: *Deleted

## 2012-02-18 ENCOUNTER — Encounter (INDEPENDENT_AMBULATORY_CARE_PROVIDER_SITE_OTHER): Payer: Medicare Other

## 2012-02-18 DIAGNOSIS — I10 Essential (primary) hypertension: Secondary | ICD-10-CM

## 2012-02-18 DIAGNOSIS — E785 Hyperlipidemia, unspecified: Secondary | ICD-10-CM

## 2012-02-18 DIAGNOSIS — I779 Disorder of arteries and arterioles, unspecified: Secondary | ICD-10-CM

## 2012-02-18 DIAGNOSIS — I739 Peripheral vascular disease, unspecified: Secondary | ICD-10-CM

## 2012-02-18 LAB — LIPID PANEL
Cholesterol: 97 mg/dL (ref 0–200)
HDL: 38.2 mg/dL — ABNORMAL LOW (ref >39.00)
Triglycerides: 49 mg/dL (ref 0.0–149.0)
VLDL: 9.8 mg/dL (ref 0.0–40.0)

## 2012-02-18 LAB — HEPATIC FUNCTION PANEL
ALT: 18 U/L (ref 0–35)
AST: 31 U/L (ref 0–37)
Total Protein: 8.4 g/dL — ABNORMAL HIGH (ref 6.0–8.3)

## 2012-02-18 LAB — BASIC METABOLIC PANEL
Calcium: 9.5 mg/dL (ref 8.4–10.5)
GFR: 70.71 mL/min (ref >60.00)
Potassium: 3.5 mEq/L (ref 3.5–5.1)
Sodium: 142 mEq/L (ref 135–145)

## 2012-02-24 ENCOUNTER — Other Ambulatory Visit: Payer: Self-pay | Admitting: Cardiology

## 2012-03-02 ENCOUNTER — Ambulatory Visit (INDEPENDENT_AMBULATORY_CARE_PROVIDER_SITE_OTHER): Payer: Medicare Other | Admitting: *Deleted

## 2012-03-02 DIAGNOSIS — Z7901 Long term (current) use of anticoagulants: Secondary | ICD-10-CM

## 2012-03-02 DIAGNOSIS — I4891 Unspecified atrial fibrillation: Secondary | ICD-10-CM

## 2012-03-02 LAB — POCT INR: INR: 6

## 2012-03-02 LAB — PROTIME-INR: Prothrombin Time: 70.2 s (ref 10.2–12.4)

## 2012-03-03 ENCOUNTER — Telehealth: Payer: Self-pay | Admitting: Cardiology

## 2012-03-03 NOTE — Telephone Encounter (Signed)
New Problem:   Called in wanting to know if her mother was supposed to stop taking any of her medications prior to her visit on Thursday.  Please call back.

## 2012-03-03 NOTE — Telephone Encounter (Signed)
[name] fills pt's pill box, inquiring about instructions on which medications pt is supposed to be holding.  Pt's daughter [name] reported pt has been instructed to hold all of her medications until Thursday when [facility]heck.  Explained pt and pt's daughter were instructed to only hold Coumadin until Thursday, not all of her medications.  Pt should continue on all other meds as rx, but hold the Coumadin because her blood was much too thin.  [name] verbalized understanding.  She states pt messes with her pill box after she fills it, so she is unsure if she has been taking accurately, but she has been filling per our dosing instruction sheet.

## 2012-03-04 ENCOUNTER — Encounter: Payer: Self-pay | Admitting: *Deleted

## 2012-03-05 ENCOUNTER — Ambulatory Visit (INDEPENDENT_AMBULATORY_CARE_PROVIDER_SITE_OTHER): Payer: Medicare Other | Admitting: *Deleted

## 2012-03-05 DIAGNOSIS — Z7901 Long term (current) use of anticoagulants: Secondary | ICD-10-CM

## 2012-03-05 DIAGNOSIS — I4891 Unspecified atrial fibrillation: Secondary | ICD-10-CM

## 2012-03-10 ENCOUNTER — Encounter (HOSPITAL_COMMUNITY): Payer: Self-pay | Admitting: *Deleted

## 2012-03-10 ENCOUNTER — Emergency Department (HOSPITAL_COMMUNITY)
Admission: EM | Admit: 2012-03-10 | Discharge: 2012-03-10 | Disposition: A | Discharge status: Home / Self Care | Payer: Medicare Other | Attending: Emergency Medicine | Admitting: Emergency Medicine

## 2012-03-10 DIAGNOSIS — E669 Obesity, unspecified: Secondary | ICD-10-CM | POA: Insufficient documentation

## 2012-03-10 DIAGNOSIS — R4182 Altered mental status, unspecified: Secondary | ICD-10-CM | POA: Insufficient documentation

## 2012-03-10 DIAGNOSIS — I10 Essential (primary) hypertension: Secondary | ICD-10-CM | POA: Insufficient documentation

## 2012-03-10 DIAGNOSIS — Z79899 Other long term (current) drug therapy: Secondary | ICD-10-CM | POA: Insufficient documentation

## 2012-03-10 DIAGNOSIS — I4891 Unspecified atrial fibrillation: Secondary | ICD-10-CM | POA: Insufficient documentation

## 2012-03-10 DIAGNOSIS — Z7901 Long term (current) use of anticoagulants: Secondary | ICD-10-CM | POA: Insufficient documentation

## 2012-03-10 DIAGNOSIS — I252 Old myocardial infarction: Secondary | ICD-10-CM | POA: Insufficient documentation

## 2012-03-10 DIAGNOSIS — E860 Dehydration: Secondary | ICD-10-CM | POA: Insufficient documentation

## 2012-03-10 DIAGNOSIS — E785 Hyperlipidemia, unspecified: Secondary | ICD-10-CM | POA: Insufficient documentation

## 2012-03-10 LAB — BASIC METABOLIC PANEL
BUN: 22 mg/dL (ref 6–23)
Calcium: 9.6 mg/dL (ref 8.4–10.5)
GFR calc Af Amer: 55 mL/min — ABNORMAL LOW (ref >90)
GFR calc non Af Amer: 48 mL/min — ABNORMAL LOW (ref >90)
Glucose, Bld: 94 mg/dL (ref 70–99)
Potassium: 4.6 mEq/L (ref 3.5–5.1)
Sodium: 139 mEq/L (ref 135–145)

## 2012-03-10 LAB — CBC
HCT: 38.8 % (ref 36.0–46.0)
Hemoglobin: 13 g/dL (ref 12.0–15.0)
MCH: 29.5 pg (ref 26.0–34.0)
MCHC: 33.5 g/dL (ref 30.0–36.0)
RDW: 12.7 % (ref 11.5–15.5)

## 2012-03-10 LAB — URINALYSIS, MICROSCOPIC ONLY
Bilirubin Urine: NEGATIVE
Hgb urine dipstick: NEGATIVE
Ketones, ur: NEGATIVE mg/dL
Nitrite: NEGATIVE
Protein, ur: 30 mg/dL — AB
Specific Gravity, Urine: 1.011 (ref 1.005–1.030)
Urobilinogen, UA: 1 mg/dL (ref 0.0–1.0)

## 2012-03-10 MED ORDER — SODIUM CHLORIDE 0.9 % IV BOLUS (SEPSIS)
1000.0000 mL | Freq: Once | INTRAVENOUS | Status: AC
  Administered 2012-03-10: 1000 mL via INTRAVENOUS

## 2012-03-10 NOTE — ED Notes (Signed)
Pt was found by [employer] walking on the sidewalk, seemingly confused.  A&O x 4 to EMS but unable to give an address of where she lives.  Pt told them she was walking to her sisters house but didn't know where her house was.  EMS thinks she had been walking about 8 miles according to the address on her ID and where she was found.  Pt smells of urine and seems unkempt.  CBG 90, 142/90, 90 irregular, 14resps, lungs clear.  Negative stroke screen by EMS.

## 2012-03-10 NOTE — ED Provider Notes (Signed)
History     CSN: [id number]  Arrival date & time 03/10/12  1140   First MD Initiated Contact with Patient 03/10/12 1227      Chief Complaint  Patient presents with  . Altered Mental Status    HPI The patient was found walking history he does seem slightly confused. It sounds as though the patient got onto a bus to go visit her sister and other and her neighborhood and got turned around. She then reports that she got lost and continue walking and was unable to find her way. The family reports that she lives at home with her son and does all of her normal ADLs. She takes the bus frequently to go to the bank and their bills and another small air and but the report is not that atypical for her to get slightly confused at times. Family reports the patient is currently at baseline. She's had no chest pain or shortness of breath. She denies nausea vomiting or diarrhea. She has no fevers or chills. She denies dysuria urinary frequency   Past Medical History  Diagnosis Date  . HTN (hypertension)     resistant htn. pt states that this has been poorly controlled for years.  . Diastolic HF (heart failure)     Echo (7/11): EF 60-65%, moderate LVH, severe diastolic dysfunction.   . Iron deficiency anemia   . Atrial fibrillation     Chronic. Has had cardioembolic right renal infarct in 12/06 and cardioembolic event to the right arm requiring right brachial embolectomy. Both events occurred while subtherapeutic on coumadin.  . Obese   . Cerebrovascular accident, embolic     left frontal  . Dyslipidemia   . Acute MI, anterior wall     Lexiscan myoview (9/11): EF 76%, normal wall motion, possible small anterior MI with mild peri-infarct . ischemia but cannot rule out breast attenuation.     Past Surgical History  Procedure Date  . Embolectomy     right brachial embolectomy    Family History  Problem Relation Age of Onset  . Hypertension      multipe relatives with Resistant HTN.     History  Substance Use Topics  . Smoking status: Never Smoker   . Smokeless tobacco: Not on file  . Alcohol Use: No    OB History    Grav Para Term Preterm Abortions TAB SAB Ect Mult Living                  Review of Systems  All other systems reviewed and are negative.    Allergies  Review of patient's allergies indicates no known allergies.  Home Medications   Current Outpatient Rx  Name Route Sig Dispense Refill  . AMLODIPINE BESYLATE 10 MG PO TABS Oral Take 1 tablet (10 mg total) by mouth daily. 30 tablet 4  . ATORVASTATIN CALCIUM 80 MG PO TABS Oral Take 1 tablet (80 mg total) by mouth daily. 30 tablet 4  . CARVEDILOL 12.5 MG PO TABS Oral Take 1 tablet (12.5 mg total) by mouth 2 (two) times daily with a meal. 60 tablet 6  . CLONIDINE HCL 0.3 MG PO TABS Oral Take 1 tablet (0.3 mg total) by mouth 2 (two) times daily. 60 tablet 6  . FUROSEMIDE 40 MG PO TABS Oral Take 1 tablet (40 mg total) by mouth 2 (two) times daily. 60 tablet 11  . LISINOPRIL 40 MG PO TABS Oral Take 1 tablet (40 mg total) by mouth daily.  30 tablet 4  . WARFARIN SODIUM 4 MG PO TABS  Take as directed by anticoagulation clinic 30 tablet 3    BP 114/56  Pulse 92  Temp 98.6 F (37 C) (Oral)  Resp 16  SpO2 98%  Physical Exam  Nursing note and vitals reviewed. Constitutional: She is oriented to person, place, and time. She appears well-developed and well-nourished. No distress.  HENT:  Head: Normocephalic and atraumatic.  Eyes: EOM are normal.  Neck: Normal range of motion.  Cardiovascular: Normal rate, regular rhythm and normal heart sounds.   Pulmonary/Chest: Effort normal and breath sounds normal.  Abdominal: Soft. She exhibits no distension. There is no tenderness.  Musculoskeletal: Normal range of motion.  Neurological: She is alert and oriented to person, place, and time.  Skin: Skin is warm and dry.  Psychiatric: She has a normal mood and affect. Judgment normal.    ED Course   Procedures (including critical care time)  Labs Reviewed  BASIC METABOLIC PANEL - Abnormal; Notable for the following:    GFR calc non Af Amer 48 (*)     GFR calc Af Amer 55 (*)     All other components within normal limits  URINALYSIS, WITH MICROSCOPIC - Abnormal; Notable for the following:    APPearance CLOUDY (*)     Protein, ur 30 (*)     Leukocytes, UA SMALL (*)     Bacteria, UA FEW (*)     Squamous Epithelial / LPF MANY (*)     All other components within normal limits  CBC   No results found.   1. Dehydration       MDM  Well appearing. Mild dehydration improved with fluids.          Lyanne Co, MD 03/10/12 1440

## 2012-03-10 NOTE — Discharge Instructions (Signed)
Dehydration, Adult Dehydration is when you lose more fluids from the body than you take in. Vital organs like the kidneys, brain, and heart cannot function without a proper amount of fluids and salt. Any loss of fluids from the body can cause dehydration.  CAUSES   Vomiting.   Diarrhea.   Excessive sweating.   Excessive urine output.   Fever.  SYMPTOMS  Mild dehydration  Thirst.   Dry lips.   Slightly dry mouth.  Moderate dehydration  Very dry mouth.   Sunken eyes.   Skin does not bounce back quickly when lightly pinched and released.   Dark urine and decreased urine production.   Decreased tear production.   Headache.  Severe dehydration  Very dry mouth.   Extreme thirst.   Rapid, weak pulse (more than 100 beats per minute at rest).   Cold hands and feet.   Not able to sweat in spite of heat and temperature.   Rapid breathing.   Blue lips.   Confusion and lethargy.   Difficulty being awakened.   Minimal urine production.   No tears.  DIAGNOSIS  Your caregiver will diagnose dehydration based on your symptoms and your exam. Blood and urine tests will help confirm the diagnosis. The diagnostic evaluation should also identify the cause of dehydration. TREATMENT  Treatment of mild or moderate dehydration can often be done at home by increasing the amount of fluids that you drink. It is best to drink small amounts of fluid more often. Drinking too much at one time can make vomiting worse. Refer to the home care instructions below. Severe dehydration needs to be treated at the hospital where you will probably be given intravenous (IV) fluids that contain water and electrolytes. HOME CARE INSTRUCTIONS   Ask your caregiver about specific rehydration instructions.   Drink enough fluids to keep your urine clear or pale yellow.   Drink small amounts frequently if you have nausea and vomiting.   Eat as you normally do.   Avoid:   Foods or drinks high in  sugar.   Carbonated drinks.   Juice.   Extremely hot or cold fluids.   Drinks with caffeine.   Fatty, greasy foods.   Alcohol.   Tobacco.   Overeating.   Gelatin desserts.   Wash your hands well to avoid spreading bacteria and viruses.   Only take over-the-counter or prescription medicines for pain, discomfort, or fever as directed by your caregiver.   Ask your caregiver if you should continue all prescribed and over-the-counter medicines.   Keep all follow-up appointments with your caregiver.  SEEK MEDICAL CARE IF:  You have abdominal pain and it increases or stays in one area (localizes).   You have a rash, stiff neck, or severe headache.   You are irritable, sleepy, or difficult to awaken.   You are weak, dizzy, or extremely thirsty.  SEEK IMMEDIATE MEDICAL CARE IF:   You are unable to keep fluids down or you get worse despite treatment.   You have frequent episodes of vomiting or diarrhea.   You have blood or green matter (bile) in your vomit.   You have blood in your stool or your stool looks black and tarry.   You have not urinated in 6 to 8 hours, or you have only urinated a small amount of very dark urine.   You have a fever.   You faint.  MAKE SURE YOU:   Understand these instructions.   Will watch your condition.     Will get help right away if you are not doing well or get worse.  Document Released: 09/09/2005 Document Revised: 08/29/2011 Document Reviewed: 04/29/2011 [employer] Patient Information 2012 [employer]. 

## 2012-03-10 NOTE — ED Notes (Signed)
Pt states when she went to the bathroom her urine was hot. Pt denies any other symptoms. Pt denies any pain

## 2012-03-10 NOTE — ED Notes (Signed)
ONG:[id number]<WU> Expected date:<BR> Expected time:11:42 AM<BR> Means of arrival:<BR> Comments:<BR> M31 - 79yoF Confused, found walking outside, ?fr a local nursing home but has no ID

## 2012-03-10 NOTE — ED Notes (Signed)
Pt states she was walking to the bank and her sister house. Pt states she wanted to get some money to turn her phone on. Pt states she forget where her sister lived at because it has been a while since she visited her sister's house.

## 2012-03-11 ENCOUNTER — Ambulatory Visit: Payer: Medicare Other | Admitting: Physician Assistant

## 2012-03-19 ENCOUNTER — Ambulatory Visit (INDEPENDENT_AMBULATORY_CARE_PROVIDER_SITE_OTHER): Payer: Medicare Other

## 2012-03-19 DIAGNOSIS — Z7901 Long term (current) use of anticoagulants: Secondary | ICD-10-CM

## 2012-03-19 DIAGNOSIS — I4891 Unspecified atrial fibrillation: Secondary | ICD-10-CM

## 2012-03-19 LAB — POCT INR: INR: 3.4

## 2012-04-02 ENCOUNTER — Ambulatory Visit (INDEPENDENT_AMBULATORY_CARE_PROVIDER_SITE_OTHER): Payer: Medicare Other | Admitting: *Deleted

## 2012-04-02 DIAGNOSIS — Z7901 Long term (current) use of anticoagulants: Secondary | ICD-10-CM

## 2012-04-02 DIAGNOSIS — I4891 Unspecified atrial fibrillation: Secondary | ICD-10-CM

## 2012-04-02 LAB — POCT INR: INR: 3.4

## 2012-04-04 ENCOUNTER — Other Ambulatory Visit: Payer: Self-pay | Admitting: Cardiology

## 2012-04-16 ENCOUNTER — Ambulatory Visit (INDEPENDENT_AMBULATORY_CARE_PROVIDER_SITE_OTHER): Payer: Medicare Other | Admitting: Pharmacist

## 2012-04-16 DIAGNOSIS — Z7901 Long term (current) use of anticoagulants: Secondary | ICD-10-CM

## 2012-04-16 DIAGNOSIS — I4891 Unspecified atrial fibrillation: Secondary | ICD-10-CM

## 2012-04-16 LAB — POCT INR: INR: 1.9

## 2012-04-25 ENCOUNTER — Other Ambulatory Visit: Payer: Self-pay | Admitting: Cardiology

## 2012-04-30 ENCOUNTER — Ambulatory Visit (INDEPENDENT_AMBULATORY_CARE_PROVIDER_SITE_OTHER): Payer: Medicare Other | Admitting: *Deleted

## 2012-04-30 ENCOUNTER — Other Ambulatory Visit: Payer: Self-pay | Admitting: *Deleted

## 2012-04-30 DIAGNOSIS — Z7901 Long term (current) use of anticoagulants: Secondary | ICD-10-CM

## 2012-04-30 DIAGNOSIS — I4891 Unspecified atrial fibrillation: Secondary | ICD-10-CM

## 2012-04-30 LAB — POCT INR: INR: 1.7

## 2012-04-30 MED ORDER — ATORVASTATIN CALCIUM 80 MG PO TABS: 80.0000 mg | ORAL_TABLET | Freq: Every day | ORAL | Status: DC

## 2012-05-14 ENCOUNTER — Ambulatory Visit (INDEPENDENT_AMBULATORY_CARE_PROVIDER_SITE_OTHER): Payer: Medicare Other

## 2012-05-14 DIAGNOSIS — I4891 Unspecified atrial fibrillation: Secondary | ICD-10-CM

## 2012-05-14 DIAGNOSIS — Z7901 Long term (current) use of anticoagulants: Secondary | ICD-10-CM

## 2012-05-14 LAB — POCT INR: INR: 2.1

## 2012-06-04 ENCOUNTER — Ambulatory Visit (INDEPENDENT_AMBULATORY_CARE_PROVIDER_SITE_OTHER): Payer: Medicare Other | Admitting: *Deleted

## 2012-06-04 DIAGNOSIS — I4891 Unspecified atrial fibrillation: Secondary | ICD-10-CM

## 2012-06-04 DIAGNOSIS — Z7901 Long term (current) use of anticoagulants: Secondary | ICD-10-CM

## 2012-06-04 LAB — POCT INR: INR: 2.2

## 2012-07-02 ENCOUNTER — Ambulatory Visit (INDEPENDENT_AMBULATORY_CARE_PROVIDER_SITE_OTHER): Payer: Medicare Other | Admitting: *Deleted

## 2012-07-02 DIAGNOSIS — Z7901 Long term (current) use of anticoagulants: Secondary | ICD-10-CM

## 2012-07-02 DIAGNOSIS — I4891 Unspecified atrial fibrillation: Secondary | ICD-10-CM

## 2012-07-24 ENCOUNTER — Other Ambulatory Visit: Payer: Self-pay | Admitting: Cardiology

## 2012-07-30 ENCOUNTER — Ambulatory Visit (INDEPENDENT_AMBULATORY_CARE_PROVIDER_SITE_OTHER): Payer: Medicare Other

## 2012-07-30 DIAGNOSIS — Z7901 Long term (current) use of anticoagulants: Secondary | ICD-10-CM

## 2012-07-30 DIAGNOSIS — I4891 Unspecified atrial fibrillation: Secondary | ICD-10-CM

## 2012-08-27 ENCOUNTER — Ambulatory Visit (INDEPENDENT_AMBULATORY_CARE_PROVIDER_SITE_OTHER): Payer: Medicare Other | Admitting: *Deleted

## 2012-08-27 DIAGNOSIS — Z7901 Long term (current) use of anticoagulants: Secondary | ICD-10-CM

## 2012-08-27 DIAGNOSIS — I4891 Unspecified atrial fibrillation: Secondary | ICD-10-CM

## 2012-08-27 LAB — POCT INR: INR: 2

## 2012-08-29 ENCOUNTER — Other Ambulatory Visit: Payer: Self-pay | Admitting: Cardiology

## 2012-08-31 ENCOUNTER — Other Ambulatory Visit: Payer: Self-pay | Admitting: *Deleted

## 2012-08-31 MED ORDER — CARVEDILOL 12.5 MG PO TABS: 12.5000 mg | ORAL_TABLET | Freq: Two times a day (BID) | ORAL | Status: DC

## 2012-09-25 ENCOUNTER — Other Ambulatory Visit: Payer: Self-pay

## 2012-09-25 MED ORDER — CLONIDINE HCL 0.3 MG PO TABS: 0.3000 mg | ORAL_TABLET | Freq: Two times a day (BID) | ORAL | Status: DC

## 2012-10-08 ENCOUNTER — Ambulatory Visit (INDEPENDENT_AMBULATORY_CARE_PROVIDER_SITE_OTHER): Payer: Medicare Other | Admitting: *Deleted

## 2012-10-08 DIAGNOSIS — I4891 Unspecified atrial fibrillation: Secondary | ICD-10-CM

## 2012-10-08 DIAGNOSIS — Z7901 Long term (current) use of anticoagulants: Secondary | ICD-10-CM

## 2012-10-28 ENCOUNTER — Other Ambulatory Visit: Payer: Self-pay

## 2012-10-28 MED ORDER — CLONIDINE HCL 0.3 MG PO TABS: 0.3000 mg | ORAL_TABLET | Freq: Two times a day (BID) | ORAL | Status: DC

## 2012-10-29 ENCOUNTER — Ambulatory Visit (INDEPENDENT_AMBULATORY_CARE_PROVIDER_SITE_OTHER): Payer: Medicare Other | Admitting: Pharmacist

## 2012-10-29 DIAGNOSIS — I4891 Unspecified atrial fibrillation: Secondary | ICD-10-CM

## 2012-10-29 DIAGNOSIS — Z7901 Long term (current) use of anticoagulants: Secondary | ICD-10-CM

## 2012-11-23 ENCOUNTER — Other Ambulatory Visit: Payer: Self-pay | Admitting: Cardiology

## 2012-11-26 ENCOUNTER — Ambulatory Visit (INDEPENDENT_AMBULATORY_CARE_PROVIDER_SITE_OTHER): Payer: Medicare Other | Admitting: *Deleted

## 2012-11-26 DIAGNOSIS — I4891 Unspecified atrial fibrillation: Secondary | ICD-10-CM

## 2012-11-26 LAB — POCT INR: INR: 2.6

## 2013-01-09 ENCOUNTER — Other Ambulatory Visit: Payer: Self-pay | Admitting: Cardiology

## 2013-01-11 ENCOUNTER — Ambulatory Visit (INDEPENDENT_AMBULATORY_CARE_PROVIDER_SITE_OTHER): Payer: Medicare Other | Admitting: Pharmacist

## 2013-01-11 ENCOUNTER — Encounter: Payer: Self-pay | Admitting: Pharmacist

## 2013-01-11 DIAGNOSIS — Z7901 Long term (current) use of anticoagulants: Secondary | ICD-10-CM

## 2013-01-11 DIAGNOSIS — I4891 Unspecified atrial fibrillation: Secondary | ICD-10-CM

## 2013-01-11 LAB — POCT INR: INR: 2.3

## 2013-01-11 MED ORDER — CARVEDILOL 12.5 MG PO TABS: 12.5000 mg | ORAL_TABLET | Freq: Two times a day (BID) | ORAL | Status: DC

## 2013-01-11 NOTE — Telephone Encounter (Signed)
Patient Instructions    Your physician has recommended you make the following change in your medication:  Increase furosemide ( lasix) to 40mg  in the morning and 40 mg in the afternoon  Stop aspirin  Decrease Carvedilol to 12.5 mg 1 tablet twice a day.  Your physician recommends that you return for lab work in: 1 week for a cmp, and fasting cholesterol.  Your physician has requested that you have a lower arterial duplex. This test is an ultrasound of the arteries in the legs or arms. It looks at arterial blood flow in the legs and arms. Allow one hour for Lower and Upper Arterial scans. There are no restrictions or special instructions  Your physician wants you to follow-up in: 1 month with Tereso Newcomer. You will receive a reminder letter in the mail two months in advance. If you don't receive a letter, please call our office to schedule the follow-up appointment.          Patient Instructions History Recorded                     Previous Visit      Provider Department Encounter #   12/16/2011 8:30 AM Marca Ancona, MD [facility] [id number]

## 2013-01-30 ENCOUNTER — Emergency Department (HOSPITAL_COMMUNITY): Payer: Medicare Other

## 2013-01-30 ENCOUNTER — Emergency Department (HOSPITAL_COMMUNITY)
Admission: EM | Admit: 2013-01-30 | Discharge: 2013-01-30 | Disposition: A | Discharge status: Home / Self Care | Payer: Medicare Other | Attending: Emergency Medicine | Admitting: Emergency Medicine

## 2013-01-30 ENCOUNTER — Encounter (HOSPITAL_COMMUNITY): Payer: Self-pay

## 2013-01-30 ENCOUNTER — Other Ambulatory Visit: Payer: Self-pay

## 2013-01-30 DIAGNOSIS — Z9119 Patient's noncompliance with other medical treatment and regimen: Secondary | ICD-10-CM | POA: Insufficient documentation

## 2013-01-30 DIAGNOSIS — Z7901 Long term (current) use of anticoagulants: Secondary | ICD-10-CM | POA: Insufficient documentation

## 2013-01-30 DIAGNOSIS — E669 Obesity, unspecified: Secondary | ICD-10-CM | POA: Insufficient documentation

## 2013-01-30 DIAGNOSIS — Z8679 Personal history of other diseases of the circulatory system: Secondary | ICD-10-CM | POA: Insufficient documentation

## 2013-01-30 DIAGNOSIS — E785 Hyperlipidemia, unspecified: Secondary | ICD-10-CM | POA: Insufficient documentation

## 2013-01-30 DIAGNOSIS — R609 Edema, unspecified: Secondary | ICD-10-CM | POA: Insufficient documentation

## 2013-01-30 DIAGNOSIS — I252 Old myocardial infarction: Secondary | ICD-10-CM | POA: Insufficient documentation

## 2013-01-30 DIAGNOSIS — I10 Essential (primary) hypertension: Secondary | ICD-10-CM | POA: Insufficient documentation

## 2013-01-30 DIAGNOSIS — I5032 Chronic diastolic (congestive) heart failure: Secondary | ICD-10-CM | POA: Insufficient documentation

## 2013-01-30 DIAGNOSIS — I4891 Unspecified atrial fibrillation: Secondary | ICD-10-CM | POA: Insufficient documentation

## 2013-01-30 DIAGNOSIS — Z9114 Patient's other noncompliance with medication regimen: Secondary | ICD-10-CM

## 2013-01-30 DIAGNOSIS — Z8673 Personal history of transient ischemic attack (TIA), and cerebral infarction without residual deficits: Secondary | ICD-10-CM | POA: Insufficient documentation

## 2013-01-30 DIAGNOSIS — Z79899 Other long term (current) drug therapy: Secondary | ICD-10-CM | POA: Insufficient documentation

## 2013-01-30 DIAGNOSIS — Z91199 Patient's noncompliance with other medical treatment and regimen due to unspecified reason: Secondary | ICD-10-CM | POA: Insufficient documentation

## 2013-01-30 DIAGNOSIS — Z862 Personal history of diseases of the blood and blood-forming organs and certain disorders involving the immune mechanism: Secondary | ICD-10-CM | POA: Insufficient documentation

## 2013-01-30 LAB — URINALYSIS, ROUTINE W REFLEX MICROSCOPIC
Glucose, UA: NEGATIVE mg/dL
Ketones, ur: NEGATIVE mg/dL
Leukocytes, UA: NEGATIVE
Nitrite: NEGATIVE
Protein, ur: NEGATIVE mg/dL
Urobilinogen, UA: 0.2 mg/dL (ref 0.0–1.0)

## 2013-01-30 LAB — CBC WITH DIFFERENTIAL/PLATELET
Basophils Absolute: 0 10*3/uL (ref 0.0–0.1)
Basophils Relative: 1 % (ref 0–1)
Eosinophils Absolute: 0.2 10*3/uL (ref 0.0–0.7)
Eosinophils Relative: 4 % (ref 0–5)
Lymphs Abs: 1.7 10*3/uL (ref 0.7–4.0)
MCH: 30.8 pg (ref 26.0–34.0)
MCV: 90 fL (ref 78.0–100.0)
Neutrophils Relative %: 44 % (ref 43–77)
Platelets: 151 10*3/uL (ref 150–400)
RBC: 4.19 MIL/uL (ref 3.87–5.11)
RDW: 13.6 % (ref 11.5–15.5)

## 2013-01-30 LAB — BASIC METABOLIC PANEL
Calcium: 9.4 mg/dL (ref 8.4–10.5)
Creatinine, Ser: 1.21 mg/dL — ABNORMAL HIGH (ref 0.50–1.10)
GFR calc non Af Amer: 42 mL/min — ABNORMAL LOW (ref >90)
Glucose, Bld: 97 mg/dL (ref 70–99)
Sodium: 141 mEq/L (ref 135–145)

## 2013-01-30 LAB — PROTIME-INR: Prothrombin Time: 20.6 seconds — ABNORMAL HIGH (ref 11.6–15.2)

## 2013-01-30 LAB — TROPONIN I: Troponin I: <0.3 ng/mL (ref <0.30)

## 2013-01-30 LAB — PRO B NATRIURETIC PEPTIDE: Pro B Natriuretic peptide (BNP): 588.4 pg/mL — ABNORMAL HIGH (ref 0–450)

## 2013-01-30 MED ORDER — FUROSEMIDE 10 MG/ML IJ SOLN
40.0000 mg | Freq: Once | INTRAMUSCULAR | Status: AC
  Administered 2013-01-30: 40 mg via INTRAVENOUS
  Filled 2013-01-30: qty 4

## 2013-01-30 NOTE — ED Provider Notes (Signed)
History     CSN: [id number]  Arrival date & time 01/30/13  0059   First MD Initiated Contact with Patient 01/30/13 0207      Chief Complaint  Patient presents with  . Foot Swelling     HPI Pt was seen at 0215.   Per pt, c/o gradual onset and persistence of constant bilat pedal edema for the past 1 week or more.  States she has not been taking her lasix regularly, as prescribed.  States she has been taking it "once in a while" with improvement in her edema.  States "my kids made me come" and she did not want to come to the ED today.  Denies CP/palpitations, no SOB/cough, no abd pain, no N/V/D, no back pain, no fevers, no focal motor weakness, no tingling/numbness in extremities, no calf pain or unilateral swelling.     Past Medical History  Diagnosis Date  . HTN (hypertension)     resistant htn. pt states that this has been poorly controlled for years.  . Diastolic HF (heart failure)     Echo (7/11): EF 60-65%, moderate LVH, severe diastolic dysfunction.   . Iron deficiency anemia   . Atrial fibrillation     Chronic. Has had cardioembolic right renal infarct in 12/06 and cardioembolic event to the right arm requiring right brachial embolectomy. Both events occurred while subtherapeutic on coumadin.  . Obese   . Cerebrovascular accident, embolic     left frontal  . Dyslipidemia   . Acute MI, anterior wall     Lexiscan myoview (9/11): EF 76%, normal wall motion, possible small anterior MI with mild peri-infarct . ischemia but cannot rule out breast attenuation.     Past Surgical History  Procedure Laterality Date  . Embolectomy      right brachial embolectomy    Family History  Problem Relation Age of Onset  . Hypertension      multipe relatives with Resistant HTN.    History  Substance Use Topics  . Smoking status: Never Smoker   . Smokeless tobacco: Not on file  . Alcohol Use: No      Review of Systems ROS: Statement: All systems negative except as marked or  noted in the HPI; Constitutional: Negative for fever and chills. ; ; Eyes: Negative for eye pain, redness and discharge. ; ; ENMT: Negative for ear pain, hoarseness, nasal congestion, sinus pressure and sore throat. ; ; Cardiovascular: Negative for chest pain, palpitations, diaphoresis, dyspnea and +peripheral edema. ; ; Respiratory: Negative for cough, wheezing and stridor. ; ; Gastrointestinal: Negative for nausea, vomiting, diarrhea, abdominal pain, blood in stool, hematemesis, jaundice and rectal bleeding. . ; ; Genitourinary: Negative for dysuria, flank pain and hematuria. ; ; Musculoskeletal: Negative for back pain and neck pain. Negative for swelling and trauma.; ; Skin: Negative for pruritus, rash, abrasions, blisters, bruising and skin lesion.; ; Neuro: Negative for headache, lightheadedness and neck stiffness. Negative for weakness, altered level of consciousness , altered mental status, extremity weakness, paresthesias, involuntary movement, seizure and syncope.       Allergies  Review of patient's allergies indicates no known allergies.  Home Medications   Current Outpatient Rx  Name  Route  Sig  Dispense  Refill  . amLODipine (NORVASC) 10 MG tablet   Oral   Take 10 mg by mouth daily.         Marland Kitchen atorvastatin (LIPITOR) 80 MG tablet   Oral   Take 1 tablet (80 mg total) by mouth  daily.   30 tablet   5   . carvedilol (COREG) 12.5 MG tablet   Oral   Take 1 tablet (12.5 mg total) by mouth 2 (two) times daily with a meal.   60 tablet   3     Needs to schedule follow up appointment   . cloNIDine (CATAPRES) 0.3 MG tablet      TAKE ONE TABLET TWICE DAILY   60 tablet   2     Needs to schedule follow up   . furosemide (LASIX) 40 MG tablet   Oral   Take 1 tablet (40 mg total) by mouth 2 (two) times daily.   60 tablet   11   . lisinopril (PRINIVIL,ZESTRIL) 40 MG tablet   Oral   Take 40 mg by mouth daily.         Marland Kitchen warfarin (COUMADIN) 4 MG tablet   Oral   Take 2-4 mg  by mouth daily. Alternates taking 1 tablet one day and 1/2 tablet the other day           BP 137/80  Pulse 63  Temp(Src) 97.8 F (36.6 C) (Oral)  Resp 16  SpO2 98%  Physical Exam 0220: Physical examination:  Nursing notes reviewed; Vital signs and O2 SAT reviewed;  Constitutional: Well developed, Well nourished, Well hydrated, In no acute distress; Head:  Normocephalic, atraumatic; Eyes: EOMI, PERRL, No scleral icterus; ENMT: Mouth and pharynx normal, Mucous membranes moist; Neck: Supple, Full range of motion, No lymphadenopathy; Cardiovascular: Irregular irregular rate and rhythm, No gallop; Respiratory: Breath sounds clear & equal bilaterally, No rales, rhonchi, wheezes.  Speaking full sentences with ease, Normal respiratory effort/excursion; Chest: Nontender, Movement normal; Abdomen: Soft, Nontender, Nondistended, Normal bowel sounds; Genitourinary: No CVA tenderness; Extremities: Pulses normal, No tenderness, +1 pedal edema feet to ankles without calf asymmetry.; Neuro: AA&Ox3, Major CN grossly intact. Speech clear. No gross focal motor or sensory deficits in extremities.; Skin: Color normal, Warm, Dry.   ED Course  Procedures    MDM  MDM Reviewed: previous chart, nursing note and vitals Reviewed previous: labs and ECG Interpretation: labs, ECG and x-ray    Date: 01/30/2013  Rate: 72  Rhythm: atrial fibrillation  QRS Axis: normal  Intervals: normal  ST/T Wave abnormalities: nonspecific T wave changes leads I and aVL  Conduction Disutrbances:none  Narrative Interpretation:   Old EKG Reviewed: unchanged; no significant changes from previous EKG's dated 02/12/2012, 11/15/2011, and 08/12/2011.  Results for orders placed during the hospital encounter of 01/30/13  BASIC METABOLIC PANEL      Result Value Range   Sodium 141  135 - 145 mEq/L   Potassium 3.9  3.5 - 5.1 mEq/L   Chloride 105  96 - 112 mEq/L   CO2 29  19 - 32 mEq/L   Glucose, Bld 97  70 - 99 mg/dL   BUN 19  6 -  23 mg/dL   Creatinine, Ser 9.60 (*) 0.50 - 1.10 mg/dL   Calcium 9.4  8.4 - 45.4 mg/dL   GFR calc non Af Amer 42 (*) >90 mL/min   GFR calc Af Amer 48 (*) >90 mL/min  CBC WITH DIFFERENTIAL      Result Value Range   WBC 4.1  4.0 - 10.5 K/uL   RBC 4.19  3.87 - 5.11 MIL/uL   Hemoglobin 12.9  12.0 - 15.0 g/dL   HCT 09.8  11.9 - 14.7 %   MCV 90.0  78.0 - 100.0 fL   MCH 30.8  26.0 - 34.0 pg   MCHC 34.2  30.0 - 36.0 g/dL   RDW 16.1  09.6 - 04.5 %   Platelets 151  150 - 400 K/uL   Neutrophils Relative 44  43 - 77 %   Neutro Abs 1.8  1.7 - 7.7 K/uL   Lymphocytes Relative 41  12 - 46 %   Lymphs Abs 1.7  0.7 - 4.0 K/uL   Monocytes Relative 10  3 - 12 %   Monocytes Absolute 0.4  0.1 - 1.0 K/uL   Eosinophils Relative 4  0 - 5 %   Eosinophils Absolute 0.2  0.0 - 0.7 K/uL   Basophils Relative 1  0 - 1 %   Basophils Absolute 0.0  0.0 - 0.1 K/uL  URINALYSIS, ROUTINE W REFLEX MICROSCOPIC      Result Value Range   Color, Urine YELLOW  YELLOW   APPearance CLEAR  CLEAR   Specific Gravity, Urine 1.010  1.005 - 1.030   pH 8.0  5.0 - 8.0   Glucose, UA NEGATIVE  NEGATIVE mg/dL   Hgb urine dipstick NEGATIVE  NEGATIVE   Bilirubin Urine NEGATIVE  NEGATIVE   Ketones, ur NEGATIVE  NEGATIVE mg/dL   Protein, ur NEGATIVE  NEGATIVE mg/dL   Urobilinogen, UA 0.2  0.0 - 1.0 mg/dL   Nitrite NEGATIVE  NEGATIVE   Leukocytes, UA NEGATIVE  NEGATIVE  TROPONIN I      Result Value Range   Troponin I <0.30  <0.30 ng/mL  PRO B NATRIURETIC PEPTIDE      Result Value Range   Pro B Natriuretic peptide (BNP) 588.4 (*) 0 - 450 pg/mL  PROTIME-INR      Result Value Range   Prothrombin Time 20.6 (*) 11.6 - 15.2 seconds   INR 1.84 (*) 0.00 - 1.49   Dg Chest 2 View 01/30/2013  *RADIOLOGY REPORT*  Clinical Data: Shortness of breath.  Lower extremity swelling.  CHEST - 2 VIEW  Comparison: PA and lateral chest 05/08/2011.  Findings: There is unchanged elevation of the left hemidiaphragm. Lungs volumes are low but the lungs are  clear.  Cardiomegaly noted. No pneumothorax or pleural fluid.  IMPRESSION: Cardiomegaly without acute disease.   Original Report Authenticated By: Holley Dexter, M.D.    Results for Bonnie, Stephens (MRN [id number]) as of 01/30/2013 06:32  Ref. Range 08/26/2011 10:08 11/15/2011 09:35 02/18/2012 09:01 03/10/2012 12:56 01/30/2013 02:58  BUN Latest Range: 6-23 mg/dL 16 26 (H) 18 22 19   Creatinine Latest Range: 0.50-1.10 mg/dL 1.3 (H) 1.3 (H) 1.0 7.82 1.21 (H)    Results for Bonnie, Stephens (MRN [id number]) as of 01/30/2013 06:32  Ref. Range 04/18/2011 09:42 08/26/2011 10:08 11/15/2011 09:35 01/30/2013 02:58  Pro B Natriuretic peptide (BNP) Latest Range: 0-450 pg/mL 265.0 (H) 285.0 (H) 230.0 (H) 588.4 (H)     0600:  Pt states she is due to take her coumadin. EPIC chart review shows pt regularly has been checking INR in coumadin clinic (most recently 2 weeks ago) with therapeutic levels. BUN/Cr per baseline.  BNP mildly elevated but no acute CHF on CXR.  Pt admits to non-compliance with her lasix.  IV lasix dose given while in the ED. Pt has ambulated several times around the ED with steady gait, easy resps, Sats 98% R/A. Has climbed off the stretcher, gotten herself dressed and wants to go home now.  T/C to [facility] Dr. Antoine Poche, case discussed, including:  HPI, pertinent PM/SHx, VS/PE, dx testing, ED course and treatment:  Agrees with ED dose of IV lasix, ok to d/c pt, have her call ofc on Monday for f/u. Dx and testing d/w pt and family.  Questions answered.  Verb understanding, agreeable to d/c home with outpt f/u.            Laray Anger, DO 02/02/13 1539

## 2013-01-30 NOTE — ED Notes (Signed)
Patient transported to X-ray 

## 2013-01-30 NOTE — ED Notes (Signed)
IV Team called , unable to obtain an IV access.

## 2013-01-30 NOTE — ED Notes (Signed)
Pt c/o Bilateral feet swelling, unsure how long. Pt also reports stepping on a "white soda top" 2 nights ago

## 2013-01-30 NOTE — ED Notes (Signed)
Attempted IV access x2.  Second RN to attempt IV access.

## 2013-01-30 NOTE — ED Notes (Signed)
Pt. Ambulating with O2 Sats  98%

## 2013-01-30 NOTE — ED Notes (Signed)
IV team here to access IV

## 2013-01-31 LAB — URINE CULTURE: Colony Count: >=100000

## 2013-02-14 ENCOUNTER — Other Ambulatory Visit: Payer: Self-pay | Admitting: Cardiology

## 2013-02-22 ENCOUNTER — Ambulatory Visit (INDEPENDENT_AMBULATORY_CARE_PROVIDER_SITE_OTHER): Payer: Medicare Other | Admitting: *Deleted

## 2013-02-22 DIAGNOSIS — Z7901 Long term (current) use of anticoagulants: Secondary | ICD-10-CM

## 2013-02-22 DIAGNOSIS — I4891 Unspecified atrial fibrillation: Secondary | ICD-10-CM

## 2013-02-22 LAB — POCT INR: INR: 1.9

## 2013-03-25 ENCOUNTER — Other Ambulatory Visit: Payer: Self-pay | Admitting: Cardiology

## 2013-04-11 ENCOUNTER — Other Ambulatory Visit: Payer: Self-pay | Admitting: Cardiology

## 2013-04-12 NOTE — Telephone Encounter (Signed)
atorvastatin (LIPITOR) 80 MG tablet  Take 1 tablet (80 mg total) by mouth daily.   30 tablet

## 2013-04-25 ENCOUNTER — Other Ambulatory Visit: Payer: Self-pay | Admitting: Cardiology

## 2013-04-28 ENCOUNTER — Ambulatory Visit: Payer: Self-pay | Admitting: Pharmacist

## 2013-04-28 DIAGNOSIS — Z7901 Long term (current) use of anticoagulants: Secondary | ICD-10-CM

## 2013-04-28 DIAGNOSIS — I4891 Unspecified atrial fibrillation: Secondary | ICD-10-CM

## 2013-05-04 ENCOUNTER — Other Ambulatory Visit: Payer: Self-pay | Admitting: Cardiology

## 2013-05-21 ENCOUNTER — Other Ambulatory Visit: Payer: Self-pay | Admitting: Cardiology

## 2013-08-24 ENCOUNTER — Encounter: Payer: Self-pay | Admitting: Physician Assistant

## 2013-08-24 ENCOUNTER — Ambulatory Visit (INDEPENDENT_AMBULATORY_CARE_PROVIDER_SITE_OTHER): Payer: Medicare Other | Admitting: Physician Assistant

## 2013-08-24 VITALS — BP 124/66 | HR 86 | Ht 64.0 in | Wt 187.4 lb

## 2013-08-24 DIAGNOSIS — I739 Peripheral vascular disease, unspecified: Secondary | ICD-10-CM

## 2013-08-24 DIAGNOSIS — I5032 Chronic diastolic (congestive) heart failure: Secondary | ICD-10-CM

## 2013-08-24 DIAGNOSIS — I10 Essential (primary) hypertension: Secondary | ICD-10-CM

## 2013-08-24 DIAGNOSIS — I4891 Unspecified atrial fibrillation: Secondary | ICD-10-CM

## 2013-08-24 DIAGNOSIS — E785 Hyperlipidemia, unspecified: Secondary | ICD-10-CM

## 2013-08-24 DIAGNOSIS — R0602 Shortness of breath: Secondary | ICD-10-CM

## 2013-08-24 LAB — CBC WITH DIFFERENTIAL/PLATELET
Basophils Absolute: 0 10*3/uL (ref 0.0–0.1)
Eosinophils Absolute: 0.1 10*3/uL (ref 0.0–0.7)
Hemoglobin: 12.8 g/dL (ref 12.0–15.0)
Lymphocytes Relative: 48.4 % — ABNORMAL HIGH (ref 12.0–46.0)
Lymphs Abs: 2 10*3/uL (ref 0.7–4.0)
MCHC: 32.8 g/dL (ref 30.0–36.0)
Monocytes Relative: 8 % (ref 3.0–12.0)
Neutro Abs: 1.7 10*3/uL (ref 1.4–7.7)
Platelets: 167 10*3/uL (ref 150.0–400.0)
RDW: 14.3 % (ref 11.5–14.6)

## 2013-08-24 LAB — BRAIN NATRIURETIC PEPTIDE: Pro B Natriuretic peptide (BNP): 140 pg/mL — ABNORMAL HIGH (ref 0.0–100.0)

## 2013-08-24 NOTE — Patient Instructions (Signed)
Your physician recommends that you schedule a follow-up appointment in: 3 months with Dr Shirlee Latch  Your physician recommends that you return for lab work in: today (Bmet,CBC,BNP,LFT & PT/INR)  Your physician recommends that you continue on your current medications as directed. Please refer to the Current Medication list given to you today.

## 2013-08-24 NOTE — Progress Notes (Signed)
[address] [place], [place]  [address] Phone: [phone] Fax:  [phone]  Date:  08/24/2013   ID:  [name], DOB May 17, 1933, MRN [id number]  PCP:  [name]  Cardiologist:  Dr. Marca Stephens     History of Present Illness: [name] is a 79 y.o. female with a hx of atrial fibrillation, diastolic CHF, HTN, and cardioembolic events to the right kidney, brain, and right arm. Echo in 2011 showed moderate LVH, EF 60-65%, and severe diastolic dysfunction with moderate biatrial enlargement. Lexiscan myoview in 2011 showed a possible small anterior MI with mild peri-infarct ischemia versus breast artifact. She has PAD with an occluded popliteal artery and reconstitution via collaterals by arterial dopplers.  Last seen by Dr. Marca Stephens in 01/2012.  ABIs (process 2013): Right 0.82; left 1.1; known occluded right popliteal artery  Patient is an extremely difficult historian. She was not really able to tell me who her PCP is or what clinic she goes to.  Her grandson came back to the room and we found out she goes to [facility].  It is not clear if they are managing her coumadin.  Unfortunately, when I called the [facility], I was not allowed to speak to a provider or nurse for unclear reasons.  Patient tells me she feels short of breath "sometimes."  She has an occ chest pain.  She denies syncope.  Her LE edema is stable.    Recent Labs: 01/30/2013: Creatinine 1.21*; Hemoglobin 12.9; Potassium 3.9; Pro B Natriuretic peptide (BNP) 588.4*   Wt Readings from Last 3 Encounters:  08/24/13 187 lb 6.4 oz (85.004 kg)  02/12/12 212 lb (96.163 kg)  11/15/11 211 lb (95.709 kg)     Past Medical History:  1. Resistant HTN. Patient states that this has been poorly controlled for years.  2. Diastolic CHF: Echo (7/11): EF 72-53%, moderate LVH, severe diastolic dysfunction.  3. Iron deficiency anemia.  4. Atrial fibrillation: Chronic. Has had cardioembolic  right renal infarct in 12/06 and cardioembolic event to the right arm requiring right brachial embolectomy. Both events occurred while subtherapeutic on coumadin.  5. Obese.  6. Left frontal embolic cerebrovascular accident.  7. Dyslipidemia.  8. Possible CAD: Lexiscan myoview (9/11): EF 76%, normal wall motion, possible small anterior MI with mild peri-infarct ischemia but cannot rule out breast attenuation.  9. PAD: peripheral arterial dopplers (5/12) with right mid popliteal artery occlusion with reconstitution of arteries below the knees by collaterals. Only mild reduction in ABI on the right (not tissue-threatening).    Past Medical History  Diagnosis Date  . HTN (hypertension)     resistant htn. pt states that this has been poorly controlled for years.  . Diastolic HF (heart failure)     Echo (7/11): EF 60-65%, moderate LVH, severe diastolic dysfunction.   . Iron deficiency anemia   . Atrial fibrillation     Chronic. Has had cardioembolic right renal infarct in 12/06 and cardioembolic event to the right arm requiring right brachial embolectomy. Both events occurred while subtherapeutic on coumadin.  . Obese   . Cerebrovascular accident, embolic     left frontal  . Dyslipidemia   . Acute MI, anterior wall     Lexiscan myoview (9/11): EF 76%, normal wall motion, possible small anterior MI with mild peri-infarct . ischemia but cannot rule out breast attenuation.     Current Outpatient Prescriptions  Medication Sig Dispense Refill  . amLODipine (NORVASC)  10 MG tablet Take 10 mg by mouth daily.      Marland Kitchen atorvastatin (LIPITOR) 80 MG tablet TAKE ONE TABLET DAILY  30 tablet  0  . carvedilol (COREG) 12.5 MG tablet TAKE 1 TABLET (12.5 MG TOTAL) BY MOUTH 2 (TWO) TIMES DAILY WITH A MEAL.  60 tablet  0  . cloNIDine (CATAPRES) 0.3 MG tablet TAKE 1 TABLET BY MOUTH TWICE A DAY  60 tablet  0  . furosemide (LASIX) 40 MG tablet TAKE ONE TABLET TWICE DAILY  60 tablet  9  . lisinopril  (PRINIVIL,ZESTRIL) 40 MG tablet Take 40 mg by mouth daily.      Marland Kitchen warfarin (COUMADIN) 4 MG tablet TAKE 1 TABLET DAILY AS DIRECTED BY COUMADIN CLINIC  30 tablet  2   No current facility-administered medications for this visit.    Allergies:   Review of patient's allergies indicates no known allergies.   Social History:  The patient  reports that she has never smoked. She does not have any smokeless tobacco history on file. She reports that she does not drink alcohol.   Family History:  The patient's family history includes Hypertension in an other family member.   ROS:  Please see the history of present illness.   She denies bleeding problems.  She denies leg pain.   All other systems reviewed and negative.   PHYSICAL EXAM: VS:  BP 124/66  Pulse 86  Ht 5\' 4"  (1.626 m)  Wt 187 lb 6.4 oz (85.004 kg)  BMI 32.15 kg/m2 Well nourished, well developed, in no acute distress HEENT: normal Neck: no JVD Cardiac:  normal S1, S2; irreg irreg rhythm; no murmur Lungs:  clear to auscultation bilaterally, no wheezing, rhonchi or rales Abd: soft, nontender, no hepatomegaly Ext: trace bilateral LE edema Skin: warm and dry Neuro:  CNs 2-12 intact, no focal abnormalities noted  EKG:  AFib, HR 86, QTc 471, no ST changes     ASSESSMENT AND PLAN:  1. Atrial Fibrillation:  Rate is controlled.  She appears to still be on coumadin.  It is not clear if it is managed by anyone.  She is a poor historian and cannot really tell me.  Her daughter helps her with her medications to ensure she is taking them.  I could not get any information from her PCP's office today (clinic refused to allow me to speak to a provider or nurse).  We have requested a recent office note.  I will check an INR and CBC today.  She needs close follow up INR as she had cardio-embolic events in the past with sub-therapeutic INRs. 2. Chronic Diastolic CHF:  Volume appears stable.  She does complain of dyspnea.  Weights are actually down from  last year when she was last seen.  Check BMET, BNP today.   3. Hypertension:  Controlled. 4. Hyperlipidemia:  Continue statin.  Check LFTs today. 5. PAD:  She does not complain of significant leg pain.  Last ABIs were fairly stable. 6. Disposition:  F/u with Dr. Marca Stephens in 3 mos.    Signed, Tereso Newcomer, PA-C  08/24/2013 12:08 PM

## 2013-08-26 ENCOUNTER — Telehealth: Payer: Self-pay | Admitting: *Deleted

## 2013-08-26 ENCOUNTER — Other Ambulatory Visit: Payer: Self-pay | Admitting: *Deleted

## 2013-08-26 DIAGNOSIS — I4891 Unspecified atrial fibrillation: Secondary | ICD-10-CM

## 2013-08-26 NOTE — Telephone Encounter (Signed)
s/w pt and her son who have both been notified about lab results and will come in 12/5 for bmet, cbc that were not able to be collected 12/2. I did also verify per [facility] is checking pt's INR per [facility]. Bing Neighbors. PA advised

## 2013-08-26 NOTE — Progress Notes (Signed)
Please arrange to have her INR followup changed to our office.

## 2013-12-07 ENCOUNTER — Emergency Department (HOSPITAL_COMMUNITY)
Admission: EM | Admit: 2013-12-07 | Discharge: 2013-12-08 | Disposition: A | Discharge status: Home / Self Care | Payer: Medicare Other | Attending: Emergency Medicine | Admitting: Emergency Medicine

## 2013-12-07 ENCOUNTER — Encounter (HOSPITAL_COMMUNITY): Payer: Self-pay | Admitting: Emergency Medicine

## 2013-12-07 DIAGNOSIS — E669 Obesity, unspecified: Secondary | ICD-10-CM | POA: Insufficient documentation

## 2013-12-07 DIAGNOSIS — I5030 Unspecified diastolic (congestive) heart failure: Secondary | ICD-10-CM | POA: Insufficient documentation

## 2013-12-07 DIAGNOSIS — F419 Anxiety disorder, unspecified: Secondary | ICD-10-CM

## 2013-12-07 DIAGNOSIS — E785 Hyperlipidemia, unspecified: Secondary | ICD-10-CM | POA: Insufficient documentation

## 2013-12-07 DIAGNOSIS — F411 Generalized anxiety disorder: Secondary | ICD-10-CM | POA: Insufficient documentation

## 2013-12-07 DIAGNOSIS — Z79899 Other long term (current) drug therapy: Secondary | ICD-10-CM | POA: Insufficient documentation

## 2013-12-07 DIAGNOSIS — Z8673 Personal history of transient ischemic attack (TIA), and cerebral infarction without residual deficits: Secondary | ICD-10-CM | POA: Insufficient documentation

## 2013-12-07 DIAGNOSIS — I252 Old myocardial infarction: Secondary | ICD-10-CM | POA: Insufficient documentation

## 2013-12-07 DIAGNOSIS — Z862 Personal history of diseases of the blood and blood-forming organs and certain disorders involving the immune mechanism: Secondary | ICD-10-CM | POA: Insufficient documentation

## 2013-12-07 DIAGNOSIS — I10 Essential (primary) hypertension: Secondary | ICD-10-CM | POA: Insufficient documentation

## 2013-12-07 DIAGNOSIS — Z7901 Long term (current) use of anticoagulants: Secondary | ICD-10-CM | POA: Insufficient documentation

## 2013-12-07 DIAGNOSIS — I4891 Unspecified atrial fibrillation: Secondary | ICD-10-CM | POA: Insufficient documentation

## 2013-12-07 LAB — CBC
HCT: 35.7 % — ABNORMAL LOW (ref 36.0–46.0)
Hemoglobin: 11.6 g/dL — ABNORMAL LOW (ref 12.0–15.0)
MCH: 30.3 pg (ref 26.0–34.0)
MCHC: 32.5 g/dL (ref 30.0–36.0)
MCV: 93.2 fL (ref 78.0–100.0)
PLATELETS: 143 10*3/uL — AB (ref 150–400)
RBC: 3.83 MIL/uL — ABNORMAL LOW (ref 3.87–5.11)
RDW: 13.6 % (ref 11.5–15.5)
WBC: 3 10*3/uL — ABNORMAL LOW (ref 4.0–10.5)

## 2013-12-07 LAB — BASIC METABOLIC PANEL
BUN: 26 mg/dL — ABNORMAL HIGH (ref 6–23)
CALCIUM: 9.6 mg/dL (ref 8.4–10.5)
CO2: 27 mEq/L (ref 19–32)
Chloride: 101 mEq/L (ref 96–112)
Creatinine, Ser: 1.18 mg/dL — ABNORMAL HIGH (ref 0.50–1.10)
GFR, EST AFRICAN AMERICAN: 49 mL/min — AB (ref >90)
GFR, EST NON AFRICAN AMERICAN: 43 mL/min — AB (ref >90)
Glucose, Bld: 103 mg/dL — ABNORMAL HIGH (ref 70–99)
POTASSIUM: 4.1 meq/L (ref 3.7–5.3)
SODIUM: 138 meq/L (ref 137–147)

## 2013-12-07 LAB — RAPID URINE DRUG SCREEN, HOSP PERFORMED
AMPHETAMINES: NOT DETECTED
Barbiturates: NOT DETECTED
Benzodiazepines: NOT DETECTED
Cocaine: NOT DETECTED
OPIATES: NOT DETECTED
TETRAHYDROCANNABINOL: NOT DETECTED

## 2013-12-07 LAB — URINALYSIS, ROUTINE W REFLEX MICROSCOPIC
Bilirubin Urine: NEGATIVE
Glucose, UA: NEGATIVE mg/dL
HGB URINE DIPSTICK: NEGATIVE
Ketones, ur: NEGATIVE mg/dL
Leukocytes, UA: NEGATIVE
Nitrite: NEGATIVE
Protein, ur: NEGATIVE mg/dL
SPECIFIC GRAVITY, URINE: 1.008 (ref 1.005–1.030)
UROBILINOGEN UA: 0.2 mg/dL (ref 0.0–1.0)
pH: 6.5 (ref 5.0–8.0)

## 2013-12-07 LAB — ETHANOL

## 2013-12-07 LAB — PROTIME-INR
INR: 1.61 — AB (ref 0.00–1.49)
PROTHROMBIN TIME: 18.7 s — AB (ref 11.6–15.2)

## 2013-12-07 MED ORDER — NICOTINE 21 MG/24HR TD PT24: 21.0000 mg | MEDICATED_PATCH | Freq: Every day | TRANSDERMAL | Status: DC

## 2013-12-07 MED ORDER — ACETAMINOPHEN 325 MG PO TABS
650.0000 mg | ORAL_TABLET | ORAL | Status: DC | PRN
  Administered 2013-12-07: 650 mg via ORAL
  Filled 2013-12-07: qty 2

## 2013-12-07 MED ORDER — ATORVASTATIN CALCIUM 80 MG PO TABS
80.0000 mg | ORAL_TABLET | Freq: Every day | ORAL | Status: DC
  Filled 2013-12-07: qty 1

## 2013-12-07 MED ORDER — ALUM & MAG HYDROXIDE-SIMETH 200-200-20 MG/5ML PO SUSP: 30.0000 mL | ORAL | Status: DC | PRN

## 2013-12-07 MED ORDER — ZOLPIDEM TARTRATE 5 MG PO TABS: 5.0000 mg | ORAL_TABLET | Freq: Every evening | ORAL | Status: DC | PRN

## 2013-12-07 MED ORDER — WARFARIN SODIUM 2 MG PO TABS: 2.0000 mg | ORAL_TABLET | ORAL | Status: DC

## 2013-12-07 MED ORDER — CLONIDINE HCL 0.1 MG PO TABS
0.3000 mg | ORAL_TABLET | Freq: Three times a day (TID) | ORAL | Status: DC
  Administered 2013-12-07: 0.3 mg via ORAL
  Filled 2013-12-07: qty 3

## 2013-12-07 MED ORDER — ONDANSETRON HCL 4 MG PO TABS: 4.0000 mg | ORAL_TABLET | Freq: Three times a day (TID) | ORAL | Status: DC | PRN

## 2013-12-07 MED ORDER — LORAZEPAM 1 MG PO TABS: 1.0000 mg | ORAL_TABLET | Freq: Three times a day (TID) | ORAL | Status: DC | PRN

## 2013-12-07 MED ORDER — AMLODIPINE BESYLATE 10 MG PO TABS
10.0000 mg | ORAL_TABLET | Freq: Every day | ORAL | Status: DC
Start: 2013-12-08 — End: 2013-12-08
  Filled 2013-12-07: qty 1

## 2013-12-07 MED ORDER — FUROSEMIDE 40 MG PO TABS
40.0000 mg | ORAL_TABLET | Freq: Two times a day (BID) | ORAL | Status: DC
  Administered 2013-12-08: 40 mg via ORAL
  Filled 2013-12-07: qty 1

## 2013-12-07 MED ORDER — CARVEDILOL 12.5 MG PO TABS
12.5000 mg | ORAL_TABLET | Freq: Two times a day (BID) | ORAL | Status: DC
  Administered 2013-12-07 – 2013-12-08 (×2): 12.5 mg via ORAL
  Filled 2013-12-07 (×4): qty 1

## 2013-12-07 MED ORDER — WARFARIN SODIUM 4 MG PO TABS
4.0000 mg | ORAL_TABLET | ORAL | Status: DC
  Administered 2013-12-08: 4 mg via ORAL
  Filled 2013-12-07 (×2): qty 1

## 2013-12-07 MED ORDER — BISACODYL 5 MG PO TBEC
5.0000 mg | DELAYED_RELEASE_TABLET | Freq: Every day | ORAL | Status: DC | PRN
  Administered 2013-12-07: 5 mg via ORAL
  Filled 2013-12-07: qty 1

## 2013-12-07 MED ORDER — LISINOPRIL 20 MG PO TABS
40.0000 mg | ORAL_TABLET | Freq: Every day | ORAL | Status: DC
  Filled 2013-12-07: qty 2

## 2013-12-07 MED ORDER — WARFARIN - PHYSICIAN DOSING INPATIENT: Freq: Every day | Status: DC

## 2013-12-07 NOTE — ED Notes (Signed)
Per EMS, pt states she is feeling upset since last week when her son was hospitalized. Pt states she has lost many family members and "feels like everything is falling apart". Pt denies SI/HI.

## 2013-12-07 NOTE — ED Notes (Signed)
Bed: WA26 Expected date:  Expected time:  Means of arrival:  Comments: 

## 2013-12-07 NOTE — Progress Notes (Signed)
12/07/2013 [facility]. [id number] Discussed patient with EDP.

## 2013-12-07 NOTE — ED Notes (Signed)
Bed: WA06 Expected date:  Expected time:  Means of arrival:  Comments: EMS 

## 2013-12-07 NOTE — ED Provider Notes (Signed)
CSN: [id number]     Arrival date & time 12/07/13  1537 History   First MD Initiated Contact with Patient 12/07/13 1551     Chief Complaint  Patient presents with  . Anxiety     (Consider location/radiation/quality/duration/timing/severity/associated sxs/prior Treatment) HPI Pt presenting with c/o feeling upset due to her son being hospitalized last week. He is home from hospital now however she states that she has no one else and if he is sick she will be left alone.  She states she is home alone and thinks about these things all day.  She states that she is very upset about this, that she has tried talking to other people that live in her neighborhood and there is no one who can help her.  She states this morning she "felt bad all over" and felt that she couldn't eat because she was so upset.  No fever/chills, no fainting, no chest pain, no sob, no abdominal pain, no vomiting or diarrhea.  There are no other associated systemic symptoms, there are no other alleviating or modifying factors.   Past Medical History  Diagnosis Date  . HTN (hypertension)     resistant htn. pt states that this has been poorly controlled for years.  . Diastolic HF (heart failure)     Echo (7/11): EF 60-65%, moderate LVH, severe diastolic dysfunction.   . Iron deficiency anemia   . Atrial fibrillation     Chronic. Has had cardioembolic right renal infarct in 12/06 and cardioembolic event to the right arm requiring right brachial embolectomy. Both events occurred while subtherapeutic on coumadin.  . Obese   . Cerebrovascular accident, embolic     left frontal  . Dyslipidemia   . Acute MI, anterior wall     Lexiscan myoview (9/11): EF 76%, normal wall motion, possible small anterior MI with mild peri-infarct . ischemia but cannot rule out breast attenuation.    Past Surgical History  Procedure Laterality Date  . Embolectomy      right brachial embolectomy   Family History  Problem Relation Age of Onset  .  Hypertension      multipe relatives with Resistant HTN.   History  Substance Use Topics  . Smoking status: Never Smoker   . Smokeless tobacco: Not on file  . Alcohol Use: No   OB History   Grav Para Term Preterm Abortions TAB SAB Ect Mult Living                 Review of Systems ROS reviewed and all otherwise negative except for mentioned in HPI    Allergies  Review of patient's allergies indicates no known allergies.  Home Medications   Current Outpatient Rx  Name  Route  Sig  Dispense  Refill  . amLODipine (NORVASC) 10 MG tablet   Oral   Take 10 mg by mouth daily.         Marland Kitchen atorvastatin (LIPITOR) 80 MG tablet   Oral   Take 80 mg by mouth daily.         . bisacodyl (DULCOLAX) 5 MG EC tablet   Oral   Take 5 mg by mouth daily as needed for moderate constipation.         . carvedilol (COREG) 12.5 MG tablet   Oral   Take 12.5 mg by mouth 2 (two) times daily.         . cloNIDine (CATAPRES) 0.3 MG tablet   Oral   Take 0.3 mg by mouth  3 (three) times daily.         . furosemide (LASIX) 40 MG tablet   Oral   Take 40 mg by mouth 2 (two) times daily.         Marland Kitchen. lisinopril (PRINIVIL,ZESTRIL) 40 MG tablet   Oral   Take 40 mg by mouth daily.         Marland Kitchen. warfarin (COUMADIN) 4 MG tablet   Oral   Take 4 mg by mouth daily. Takes 2mg  on sun, tues, thurs, and sat. 4mg  on m,w,f          BP 160/89  Pulse 76  Temp(Src) 97.6 F (36.4 C) (Oral)  Resp 18  SpO2 100% Vitals reviewed Physical Exam Physical Examination: General appearance - alert, well appearing, and in no distress Mental status - alert, oriented to person, place, and time Eyes - no scleral icterus, no conjunctival injection Mouth - mucous membranes moist, pharynx normal without lesions Chest - clear to auscultation, no wheezes, rales or rhonchi, symmetric air entry Heart - normal rate, regular rhythm, normal S1, S2, no murmurs, rubs, clicks or gallops Abdomen - soft, nontender, nondistended,  no masses or organomegaly Extremities - peripheral pulses normal, no pedal edema, no clubbing or cyanosis Skin - normal coloration and turgor, no rashes, no suspicious skin lesions noted Psych- pt becomes tearful when talking about the stressors in her life.  No si/hi, no hallucinations  ED Course  Procedures (including critical care time)  12:13 AM pt has been evaluated by TTS.  Pt is to be discharged, TTS has spoken with family and they have no safety concerns about patient being at home.  Labs Review Labs Reviewed  CBC - Abnormal; Notable for the following:    WBC 3.0 (*)    RBC 3.83 (*)    Hemoglobin 11.6 (*)    HCT 35.7 (*)    Platelets 143 (*)    All other components within normal limits  BASIC METABOLIC PANEL - Abnormal; Notable for the following:    Glucose, Bld 103 (*)    BUN 26 (*)    Creatinine, Ser 1.18 (*)    GFR calc non Af Amer 43 (*)    GFR calc Af Amer 49 (*)    All other components within normal limits  URINALYSIS, ROUTINE W REFLEX MICROSCOPIC - Abnormal; Notable for the following:    APPearance CLOUDY (*)    All other components within normal limits  PROTIME-INR - Abnormal; Notable for the following:    Prothrombin Time 18.7 (*)    INR 1.61 (*)    All other components within normal limits  ETHANOL  URINE RAPID DRUG SCREEN (HOSP PERFORMED)   Imaging Review No results found.   EKG Interpretation   Date/Time:  Tuesday December 07 2013 18:40:20 EDT Ventricular Rate:  51 PR Interval:    QRS Duration: 102 QT Interval:  472 QTC Calculation: 435 R Axis:   42 Text Interpretation:  Atrial fibrillation Nonspecific T abnormalities,  lateral leads No significant change since last tracing Confirmed by [facility]INKER   MD, Clayvon Parlett [phone]) on 12/07/2013 11:10:26 PM      MDM   Final diagnoses:  Anxiety    Pt presenting with multiple complaints including anxiety about her life situation due to living with her son who was recently hospitalized.  She became very anxious  about this prior to asking son to call 911.  She has been medically cleared and seen by TTS. They have cleared her for discharged  to home and with outpatient resources.  Discharged with strict return precautions.  Pt agreeable with plan.    Ethelda Chick, MD 12/08/13 [phone]

## 2013-12-07 NOTE — Progress Notes (Signed)
   CARE MANAGEMENT ED NOTE 12/07/2013  Patient:  [name],[name]   Account Number:  [id number]  Date Initiated:  12/07/2013  Documentation initiated by:  Radford PaxFERRERO,Aaralyn Kil  Subjective/Objective Assessment:   patient presents to Ed with depression.  Patient reprting has lost family members "and everything is falling apart."     Subjective/Objective Assessment Detail:   Patient with pmhx of HTN, Afib, MI     Action/Plan:   Action/Plan Detail:   TTS consult   Anticipated DC Date:       Status Recommendation to Physician:   Result of Recommendation:    Other ED Services  Consult Working Plan    DC Planning Services  Other  PCP issues    Choice offered to / List presented to:            Status of service:  Completed, signed off  ED Comments:   ED Comments Detail:  EDCM spoke to patient and her grandson at bedside.  Patient lives with her son [name].  Patient's grandson [name] reports patient's son was in the hospital last week but is now currently back home and is well.  Patient has 2 sons and one daughter.  Patient has lost a son and a daughter. Patient has never had home health services before.  Patient does not have any medical equipment at home.  Patient repoorts she is able to complete her ADL's without difficulty and is ambulating wthout difficulty.  Patient's grandson [name] reports patient has a family member who comes over every weekend to seperate patient's medicaitons for her.  Patient's grandson reports patient may need visiting RN to help with her medications during the week. Patient's family confirms patient's pcp is Dr. Bruna PotterBlount on [address]  System updated.  EDCM provided patient with a [place] of [place] health agencies [place].  Patient and patient's grandson thankful for services.  No further EDCM needs at this time.  Patient's grandson [name] phone number [phone] and other grandson [name] [phone] (c) and (h) [phone].

## 2013-12-08 NOTE — Discharge Instructions (Signed)
Return to the ED with any concerns including thoughts or feelings of homicide or suicide, difficulty breathing, vomiting and not able to keep down liquids, decreased level of alertness/lethargy, or any other alarming symptoms   Emergency Department Resource Guide 1) Find a Doctor and Pay Out of Pocket Although you won't have to find out who is covered by your insurance plan, it is a good idea to ask around and get recommendations. You will then need to call the office and see if the doctor you have chosen will accept you as a new patient and what types of options they offer for patients who are self-pay. Some doctors offer discounts or will set up payment plans for their patients who do not have insurance, but you will need to ask so you aren't surprised when you get to your appointment.  2) Contact Your Local Health Department Not all health departments have doctors that can see patients for sick visits, but many do, so it is worth a call to see if yours does. If you don't know where your local health department is, you can check in your phone book. The CDC also has a tool to help you locate your state's health department, and many state websites also have listings of all of their local health departments.  3) Find a Walk-in Clinic If your illness is not likely to be very severe or complicated, you may want to try a walk in clinic. These are popping up all over the country in pharmacies, drugstores, and shopping centers. They're usually staffed by nurse practitioners or physician assistants that have been trained to treat common illnesses and complaints. They're usually fairly quick and inexpensive. However, if you have serious medical issues or chronic medical problems, these are probably not your best option.  No Primary Care Doctor: - Call Health Connect at  [phone] - they can help you locate a primary care doctor that  accepts your insurance, provides certain services, etc. - Physician Referral  Service- [phone]  Chronic Pain Problems: Organization         Address  Phone   Notes  [facility]  [phone] Patients need to be referred by their primary care doctor.   Medication Assistance: Organization         Address  Phone   Notes  [facility] [address] [place], [place] [address] [phone] --Must be a resident of [place] -- Must have NO insurance coverage whatsoever (no Medicaid/ Medicare, etc.) -- The pt. MUST have a primary care doctor that directs their care regularly and follows them in the community   MedAssist  [phone]   [employer]  [phone]    Agencies that provide inexpensive medical care: Organization         Address  Phone   Notes  [facility]  [phone]   [facility]    [phone]   [facility] [address] [place], [place] [address] [phone]   [facility] [address], [place] [phone]   [facility]anned [facility]    [phone]   [facility]    [phone]   [facility]  [address], [place] Phone:  [phone], Fax:  [phone] Hours of Operation:  9 am - 6 pm, M-F.  Also accepts Medicaid/Medicare and self-pay.  [facility]  [address], [place] Phone: [phone], Fax: [phone]. Hours of Operation:  8:30 am - 5:30 pm, M-F.  Also accepts Medicaid and self-pay.  [facility] [place] [address] [place] Phone: [phone]   George Mason, [place], [place] [phone], Ext. [phone] Mondays & Thursdays: 7-9 AM.  First 15 patients are seen on a first come, first serve basis.    [place] Providers:  Organization         Address  Phone   Notes  [facility] [address], [place] [phone] Also accepts self-pay patients.  [facility] [address], [place]  [phone]   [place], [address], [place] [phone]   [facility] [address], [place] [phone]   Lucianne Lei [address], [place]   [phone] Only accepts [place] Access [place] patients after they have their name applied to their card.   Self-Pay (no insurance) in [facility]:  Organization         Address  Phone   Notes  Sickle Cell Patients, [facility] [place] [phone]   [facility] [place] [phone]   [facility] [place]  [place], [place],  [phone]   [facility]/Dr. Osei-Bonsu  [address], [place] or [address], [place] [phone] Phone number for both [place] and [place] locations is the same.  [facility]gent [facility] [address], [place] [phone]   [facility] [address] [place] or [address] [phone]   [facility] [address], [place] [phone], phone; [phone], fax Sees patients 1st and 3rd Saturday of every month.  Must not qualify for public or private insurance (i.e. Medicaid, Medicare, [place] Health Choice, Veterans' Benefits)  Household income should be no more than 200% of the poverty level The clinic cannot treat you if you are pregnant or think you are pregnant  Sexually transmitted diseases are not treated at the clinic.    Dental Care: Organization         Address  Phone  Notes  [facility] Department of [facility] [phone] Accepts children up to age 4 who are enrolled in  [place] or [place]; pregnant women with a Medicaid card; and children who have applied for Medicaid or [place] Health Choice, but were declined, whose parents can pay a reduced fee at time of service.  [facility] of [facility]  [address], [place] [phone] Accepts children up to age 46 who are enrolled in [place] or [place]; pregnant women with a Medicaid card; and children who have applied for Medicaid or [place] Health Choice, but were declined, whose parents can pay a reduced fee at time of service.  [place] Adult Dental Access PROGRAM  [place] [phone] Patients are seen by appointment only. Walk-ins are not accepted. [facility] will see patients 39 years of age and older. Monday - Tuesday (8am-5pm) Most Wednesdays (8:30-5pm) $30 per visit, cash only  [place] [facility]  [address], [facility] [phone] Patients are seen by appointment only. Walk-ins are not accepted. [place] will see patients 55 years of age and older. One Wednesday Evening (Monthly: Volunteer Based).  $30 per visit, cash only  [place]  [phone] for adults; Children under age 16, call Graduate Pediatric Dentistry at [phone]. Children aged 15-14, please call [phone] to request a pediatric application.  Dental services are provided in all areas of dental care including fillings, crowns and bridges, complete and partial dentures, implants, gum treatment, root canals, and extractions. Preventive care is also provided. Treatment is provided to both adults and children. Patients are selected via a lottery and there is often a waiting list.   [facility] [address] [place]  [phone] [email or web address]   [facility] [address] [place], [place] [phone], Ext. [phone] Second and Fourth Thursday of each month, opens at 6:30  AM; Clinic ends at 9 AM.  Patients are seen on a first-come first-served basis, and a limited number are seen during each clinic.   [facility]  [address] Hillard Danker [place], [place] [phone]   Eligibility Requirements You must have lived in [place], [place], or [place] counties for at least the last three months.   You cannot be eligible for state or federal sponsored [employer], including [employer], [place], or [employer].   You generally cannot be eligible for healthcare insurance through your employer.    How to apply: Eligibility screenings are held every Tuesday and Wednesday afternoon from 1:00 pm until 4:00 pm. You do not need an appointment for the interview!  [facility] [address] [place], [place]   [employer]  [place] Department  [place]  [phone]    Behavioral Health Resources in the Community: Intensive Outpatient Programs Organization         Address  Phone  Notes  [place]. [address], [place], [place] [phone]   [facility] [address], [place], [employer]   ADS: Alcohol & Drug Svcs [address], [place], [place]   [place] [address]  [place], [place] or [phone]   Substance Abuse Resources Organization         Address  Phone  Notes  Alcohol and Drug Services  [phone]   [place]  [phone]   [facility] [place]   Chinita Pester  [phone]   Residential & Outpatient Substance Abuse Program  [phone]   Psychological Services Organization         Address  Phone  Notes  [facility] [place]  [place]  [phone]   [place] [address] [place] [phone] or  [phone]    Mobile Crisis Teams Organization         Address  Phone  Notes  [facility], Mobile Crisis Care Unit  [phone]   [facility] Psychotherapeutic Services  [address] [place], [place]   [place] [address] [place] [phone]    Self-Help/Support Groups Organization         Address  Phone             Notes  [facility] of [place] - variety of support groups  [place] Call for more information  Narcotics Anonymous (NA), [facility] [address], [employer] [place]  2 meetings at this location   Special educational needs teacher         Address  Phone  Notes  [facility] [place],    [place]  [phone]   [facility]  [address], [place] [phone], [place], [place]   [place], [place] [phone] Admissions: 8am-3pm M-F  Incentives Substance [place] [address]    [place], [place] [phone]   [facility] [address] [place], [place], [place]   [facility] [address]  [place], [place]   [facility] - Intensive Outpatient [address], [place], [place]   [facility] ([place].,  [place], [place] [phone] or [phone]   [facility] Services (RTS) [address] [place], [place] Accepts Medicaid  [facility] [place] [address],  [place] [phone] Substance Abuse/Addiction Treatment   [facility] Organization         Address  Phone  Notes  [facility]  [phone]   Domenic Schwab, PhD [address] [place]lis [place], [place]   [phone] or [phone]   [place], [place] [phone]   [facility] [address],  [place], [place] [phone] Insurance/Medicaid/sponsorship through [facility] Families [address] [place]                                    [place], [place] [phone] [place] [address], [place] [phone]    Dr. Adele Schilder  [phone]   [facility] Dept. 1) [address], [place] 2) [place] 3)  [place] [address], [place] [phone]  [phone]   [place] [phone] or [phone] (After Hours)

## 2013-12-08 NOTE — ED Notes (Signed)
Pt's family will not be available to take home until the am.

## 2013-12-08 NOTE — BH Assessment (Signed)
Assessment Note  [name] is an 80 y.o. female. Patient presents with her son c/o anxiety over her son getting sick last week and not knowing what she would do if something was to happen to him. Patient is at home alone most of the day and has had some family pass away recently. Patient denies SI, HI, AVH and has no prior history of hospitalization or depression. Patient states "we don't think like that."  Spoke with patient's grandson Asher Muir who stated that they had no safety concerns and that his grandmother has never made any comments or threats to hurt herself. And has exhibited no signs of AVH. He also denies any family history of psychiatric problems.  Patient does not meet criteria for inpatient crisis stabilization.  Spoke with Dr. Karma Ganja who agrees with disposition. Patient will be discharged in AM.  Axis I: Deferred Axis II: Deferred Axis III:  Past Medical History  Diagnosis Date  . HTN (hypertension)     resistant htn. pt states that this has been poorly controlled for years.  . Diastolic HF (heart failure)     Echo (7/11): EF 60-65%, moderate LVH, severe diastolic dysfunction.   . Iron deficiency anemia   . Atrial fibrillation     Chronic. Has had cardioembolic right renal infarct in 12/06 and cardioembolic event to the right arm requiring right brachial embolectomy. Both events occurred while subtherapeutic on coumadin.  . Obese   . Cerebrovascular accident, embolic     left frontal  . Dyslipidemia   . Acute MI, anterior wall     Lexiscan myoview (9/11): EF 76%, normal wall motion, possible small anterior MI with mild peri-infarct . ischemia but cannot rule out breast attenuation.    Axis IV: Deferred Axis V: Deferred  Past Medical History:  Past Medical History  Diagnosis Date  . HTN (hypertension)     resistant htn. pt states that this has been poorly controlled for years.  . Diastolic HF (heart failure)     Echo (7/11): EF 60-65%, moderate LVH, severe  diastolic dysfunction.   . Iron deficiency anemia   . Atrial fibrillation     Chronic. Has had cardioembolic right renal infarct in 12/06 and cardioembolic event to the right arm requiring right brachial embolectomy. Both events occurred while subtherapeutic on coumadin.  . Obese   . Cerebrovascular accident, embolic     left frontal  . Dyslipidemia   . Acute MI, anterior wall     Lexiscan myoview (9/11): EF 76%, normal wall motion, possible small anterior MI with mild peri-infarct . ischemia but cannot rule out breast attenuation.     Past Surgical History  Procedure Laterality Date  . Embolectomy      right brachial embolectomy    Family History:  Family History  Problem Relation Age of Onset  . Hypertension      multipe relatives with Resistant HTN.    Social History:  reports that she has never smoked. She does not have any smokeless tobacco history on file. She reports that she does not drink alcohol. Her drug history is not on file.  Additional Social History:  Alcohol / Drug Use History of alcohol / drug use?: No history of alcohol / drug abuse  CIWA: CIWA-Ar BP: 109/73 mmHg Pulse Rate: 75 COWS:    Allergies: No Known Allergies  Home Medications:  (Not in a hospital admission)  OB/GYN Status:  No LMP recorded. Patient is postmenopausal.  General Assessment Data Location of Assessment:  [facility] ED Is this a Tele or Face-to-Face Assessment?: Face-to-Face Is this an Initial Assessment or a Re-assessment for this encounter?: Initial Assessment Living Arrangements: Children Can pt return to current living arrangement?: Yes Admission Status: Voluntary Is patient capable of signing voluntary admission?: Yes Transfer from: Home Referral Source: MD  Medical Screening Exam [facility] Walk-in ONLY) Medical Exam completed: Yes  [facility] Crisis Care [facility] Living Arrangements: Children Name of Psychiatrist: None Name of Therapist: None  Education Status Is patient currently in  school?: No Current Grade: Na Highest grade of school patient has completed: Na Name of school: Na Contact person: [name]/ Son  Risk to self Suicidal Ideation: No Suicidal Intent: No Is patient at risk for suicide?: No Suicidal Plan?: No Access to Means: No What has been your use of drugs/alcohol within the last 12 months?: Na Previous Attempts/Gestures: No How many times?:  (Na) Other Self Harm Risks:  (None) Triggers for Past Attempts: None known Intentional Self Injurious Behavior: None Family Suicide History: No Recent stressful life event(s): Loss (Comment) (Family passed away) Persecutory voices/beliefs?: No Depression: No Depression Symptoms:  (None noted) Substance abuse history and/or treatment for substance abuse?: No Suicide prevention information given to non-admitted patients: Yes  Risk to Others Homicidal Ideation: No Thoughts of Harm to Others: No Current Homicidal Intent: No Current Homicidal Plan: No Access to Homicidal Means: No Identified Victim: Na History of harm to others?: No Assessment of Violence: None Noted Violent Behavior Description: Na Does patient have access to weapons?: No Criminal Charges Pending?: No Does patient have a court date: No  Psychosis Hallucinations: None noted Delusions: None noted  Mental Status Report Appear/Hygiene: Other (Comment) (WNL) Eye Contact: Good Motor Activity: Freedom of movement;Unremarkable Speech: Logical/coherent Level of Consciousness: Alert Mood: Other (Comment) (WNL) Affect: Appropriate to circumstance Anxiety Level: Minimal Thought Processes: Coherent;Relevant Judgement: Unimpaired Orientation: Person;Place Obsessive Compulsive Thoughts/Behaviors: None  Cognitive Functioning Concentration: Decreased Memory: Recent Intact;Remote Intact IQ: Average Insight: Good Impulse Control: Good Appetite: Good Weight Loss:  (None noted) Weight Gain:  (None noted) Sleep: No Change Total  Hours of Sleep:  (Varies) Vegetative Symptoms: None  ADLScreening [facility] Assessment Services) Patient's cognitive ability adequate to safely complete daily activities?: Yes Patient able to express need for assistance with ADLs?: Yes Independently performs ADLs?: Yes (appropriate for developmental age)  Prior Inpatient Therapy Prior Inpatient Therapy: No Prior Therapy Dates: Na Prior Therapy Facilty/Provider(s): Na Reason for Treatment: Na  Prior Outpatient Therapy Prior Outpatient Therapy: No Prior Therapy Dates: Na Prior Therapy Facilty/Provider(s): Na Reason for Treatment: Na  ADL Screening (condition at time of admission) Patient's cognitive ability adequate to safely complete daily activities?: Yes Is the patient deaf or have difficulty hearing?: Yes Does the patient have difficulty seeing, even when wearing glasses/contacts?: No Does the patient have difficulty concentrating, remembering, or making decisions?: Yes Patient able to express need for assistance with ADLs?: Yes Does the patient have difficulty dressing or bathing?: No Independently performs ADLs?: Yes (appropriate for developmental age) Does the patient have difficulty walking or climbing stairs?: Yes     Therapy Consults (therapy consults require a physician order) PT Evaluation Needed: No OT Evalulation Needed: No SLP Evaluation Needed: No Abuse/Neglect Assessment (Assessment to be complete while patient is alone) Physical Abuse: Denies Verbal Abuse: Denies Sexual Abuse: Denies Exploitation of patient/patient's resources: Denies Self-Neglect: Denies   Consults Spiritual Care Consult Needed: No Social Work Consult Needed: No      Additional Information 1:1 In Past 12 Months?: No  CIRT Risk: No Elopement Risk: No Does patient have medical clearance?: Yes     Disposition:  Disposition Initial Assessment Completed for this Encounter: Yes Disposition of Patient: Other dispositions Other  disposition(s): Information only;To current provider  On Site Evaluation by:  Linker Reviewed with Physician:    Rudi Coco 12/08/2013 12:36 AM

## 2013-12-09 NOTE — Progress Notes (Signed)
12/09/2013 A. Parag Dorton RNCM 1653pm EDCM called patient's grandson [name] for follow up about patient.  [name] reports his grandmother is, "Doing good."  EDCM informed patient's grandson that if they feel like she needed home health they could ask they could get services ordered through her pcp.  Patient's grandson thankful for services.  No further EDCM needs at this time.

## 2013-12-28 ENCOUNTER — Emergency Department (HOSPITAL_COMMUNITY): Payer: Medicare Other

## 2013-12-28 ENCOUNTER — Encounter (HOSPITAL_COMMUNITY): Payer: Self-pay | Admitting: Emergency Medicine

## 2013-12-28 ENCOUNTER — Inpatient Hospital Stay (HOSPITAL_COMMUNITY)
Admission: EM | Admit: 2013-12-28 | Discharge: 2014-01-01 | DRG: 884 | Disposition: A | Discharge status: Home Health | Payer: Medicare Other | Attending: Internal Medicine | Admitting: Internal Medicine

## 2013-12-28 DIAGNOSIS — R4182 Altered mental status, unspecified: Secondary | ICD-10-CM

## 2013-12-28 DIAGNOSIS — E039 Hypothyroidism, unspecified: Secondary | ICD-10-CM | POA: Diagnosis present

## 2013-12-28 DIAGNOSIS — I4891 Unspecified atrial fibrillation: Secondary | ICD-10-CM | POA: Diagnosis present

## 2013-12-28 DIAGNOSIS — G9340 Encephalopathy, unspecified: Secondary | ICD-10-CM

## 2013-12-28 DIAGNOSIS — I129 Hypertensive chronic kidney disease with stage 1 through stage 4 chronic kidney disease, or unspecified chronic kidney disease: Secondary | ICD-10-CM | POA: Diagnosis present

## 2013-12-28 DIAGNOSIS — N183 Chronic kidney disease, stage 3 unspecified: Secondary | ICD-10-CM | POA: Diagnosis present

## 2013-12-28 DIAGNOSIS — F0391 Unspecified dementia with behavioral disturbance: Secondary | ICD-10-CM

## 2013-12-28 DIAGNOSIS — I739 Peripheral vascular disease, unspecified: Secondary | ICD-10-CM | POA: Diagnosis present

## 2013-12-28 DIAGNOSIS — I509 Heart failure, unspecified: Secondary | ICD-10-CM | POA: Diagnosis present

## 2013-12-28 DIAGNOSIS — D649 Anemia, unspecified: Secondary | ICD-10-CM

## 2013-12-28 DIAGNOSIS — Z7901 Long term (current) use of anticoagulants: Secondary | ICD-10-CM

## 2013-12-28 DIAGNOSIS — Z8673 Personal history of transient ischemic attack (TIA), and cerebral infarction without residual deficits: Secondary | ICD-10-CM

## 2013-12-28 DIAGNOSIS — E876 Hypokalemia: Secondary | ICD-10-CM | POA: Diagnosis not present

## 2013-12-28 DIAGNOSIS — I252 Old myocardial infarction: Secondary | ICD-10-CM

## 2013-12-28 DIAGNOSIS — Z79899 Other long term (current) drug therapy: Secondary | ICD-10-CM

## 2013-12-28 DIAGNOSIS — D72819 Decreased white blood cell count, unspecified: Secondary | ICD-10-CM | POA: Diagnosis present

## 2013-12-28 DIAGNOSIS — I2510 Atherosclerotic heart disease of native coronary artery without angina pectoris: Secondary | ICD-10-CM | POA: Diagnosis present

## 2013-12-28 DIAGNOSIS — F03918 Unspecified dementia, unspecified severity, with other behavioral disturbance: Principal | ICD-10-CM | POA: Diagnosis present

## 2013-12-28 DIAGNOSIS — I10 Essential (primary) hypertension: Secondary | ICD-10-CM

## 2013-12-28 DIAGNOSIS — G9341 Metabolic encephalopathy: Secondary | ICD-10-CM | POA: Diagnosis present

## 2013-12-28 DIAGNOSIS — D696 Thrombocytopenia, unspecified: Secondary | ICD-10-CM | POA: Diagnosis present

## 2013-12-28 DIAGNOSIS — I498 Other specified cardiac arrhythmias: Secondary | ICD-10-CM | POA: Diagnosis present

## 2013-12-28 DIAGNOSIS — E785 Hyperlipidemia, unspecified: Secondary | ICD-10-CM | POA: Diagnosis present

## 2013-12-28 DIAGNOSIS — R7989 Other specified abnormal findings of blood chemistry: Secondary | ICD-10-CM

## 2013-12-28 DIAGNOSIS — I5032 Chronic diastolic (congestive) heart failure: Secondary | ICD-10-CM | POA: Diagnosis present

## 2013-12-28 DIAGNOSIS — R001 Bradycardia, unspecified: Secondary | ICD-10-CM | POA: Diagnosis present

## 2013-12-28 HISTORY — DX: Atherosclerotic heart disease of native coronary artery without angina pectoris: I25.10

## 2013-12-28 HISTORY — DX: Chronic atrial fibrillation, unspecified: I48.20

## 2013-12-28 HISTORY — DX: Peripheral vascular disease, unspecified: I73.9

## 2013-12-28 LAB — URINALYSIS, ROUTINE W REFLEX MICROSCOPIC
Bilirubin Urine: NEGATIVE
GLUCOSE, UA: NEGATIVE mg/dL
Hgb urine dipstick: NEGATIVE
Ketones, ur: NEGATIVE mg/dL
LEUKOCYTES UA: NEGATIVE
Nitrite: NEGATIVE
PH: 5.5 (ref 5.0–8.0)
Protein, ur: NEGATIVE mg/dL
Specific Gravity, Urine: 1.018 (ref 1.005–1.030)
Urobilinogen, UA: 0.2 mg/dL (ref 0.0–1.0)

## 2013-12-28 LAB — CBC WITH DIFFERENTIAL/PLATELET
Basophils Absolute: 0 10*3/uL (ref 0.0–0.1)
Basophils Relative: 1 % (ref 0–1)
Eosinophils Absolute: 0.1 10*3/uL (ref 0.0–0.7)
Eosinophils Relative: 2 % (ref 0–5)
HCT: 38.6 % (ref 36.0–46.0)
Hemoglobin: 12.7 g/dL (ref 12.0–15.0)
Lymphocytes Relative: 51 % — ABNORMAL HIGH (ref 12–46)
Lymphs Abs: 1.8 10*3/uL (ref 0.7–4.0)
MCH: 30.5 pg (ref 26.0–34.0)
MCHC: 32.9 g/dL (ref 30.0–36.0)
MCV: 92.6 fL (ref 78.0–100.0)
Monocytes Absolute: 0.3 10*3/uL (ref 0.1–1.0)
Monocytes Relative: 10 % (ref 3–12)
NEUTROS ABS: 1.3 10*3/uL — AB (ref 1.7–7.7)
NEUTROS PCT: 37 % — AB (ref 43–77)
PLATELETS: 117 10*3/uL — AB (ref 150–400)
RBC: 4.17 MIL/uL (ref 3.87–5.11)
RDW: 13.5 % (ref 11.5–15.5)
WBC: 3.5 10*3/uL — ABNORMAL LOW (ref 4.0–10.5)

## 2013-12-28 LAB — COMPREHENSIVE METABOLIC PANEL
ALT: 14 U/L (ref 0–35)
AST: 23 U/L (ref 0–37)
Albumin: 3.3 g/dL — ABNORMAL LOW (ref 3.5–5.2)
Alkaline Phosphatase: 91 U/L (ref 39–117)
BILIRUBIN TOTAL: 0.7 mg/dL (ref 0.3–1.2)
BUN: 23 mg/dL (ref 6–23)
CHLORIDE: 102 meq/L (ref 96–112)
CO2: 22 mEq/L (ref 19–32)
Calcium: 9.4 mg/dL (ref 8.4–10.5)
Creatinine, Ser: 1.21 mg/dL — ABNORMAL HIGH (ref 0.50–1.10)
GFR calc Af Amer: 48 mL/min — ABNORMAL LOW (ref >90)
GFR calc non Af Amer: 41 mL/min — ABNORMAL LOW (ref >90)
Glucose, Bld: 119 mg/dL — ABNORMAL HIGH (ref 70–99)
Potassium: 3.9 mEq/L (ref 3.7–5.3)
Sodium: 141 mEq/L (ref 137–147)
Total Protein: 7.4 g/dL (ref 6.0–8.3)

## 2013-12-28 LAB — TROPONIN I: Troponin I: <0.3 ng/mL (ref <0.30)

## 2013-12-28 LAB — PROTIME-INR
INR: 2.27 — AB (ref 0.00–1.49)
Prothrombin Time: 24.3 seconds — ABNORMAL HIGH (ref 11.6–15.2)

## 2013-12-28 NOTE — ED Notes (Addendum)
Pt is unable to communicate what is going on with her; Pt son at bedside; per Son pt has not slept in past 3-4 days and pt reports just not feeling well. Pt reports slight HA. Son reports pt begin slurring words a couple of days ago. Pt only has one working Kidney; reports of left hip pain.

## 2013-12-28 NOTE — ED Notes (Signed)
MD at bedside. 

## 2013-12-28 NOTE — ED Notes (Signed)
Pt reports headache and L hip pain. Pt sts "I just feel bad and I can't rest like I want to" this has been ongoing for 2-3 days. Family reports pt speech is slower as well. Pt unable to elaborate on how she is feeling.

## 2013-12-28 NOTE — ED Notes (Signed)
Patient transported to X-ray 

## 2013-12-28 NOTE — ED Provider Notes (Signed)
CSN: [id number]     Arrival date & time 12/28/13  1833 History   First MD Initiated Contact with Patient 12/28/13 2022     Chief Complaint  Patient presents with  . Headache  . Hip Pain    Level V caveat due to altered mental status. (Consider location/radiation/quality/duration/timing/severity/associated sxs/prior Treatment) Patient is a 80 y.o. female presenting with headaches and hip pain. The history is provided by the patient and a relative.  Headache Associated symptoms: no neck stiffness   Hip Pain Associated symptoms include headaches.   patient was brought in by family members after patient requested coming to the ER. She states she's been doing bed. States she is not able sleep. She is somewhat of a poor historian and he had difficulty getting a good history from her. She denies fever. She states she has not had thrown up or diarrhea. She denies dysuria. Patient's family member states she may not be taking her medication.  Past Medical History  Diagnosis Date  . HTN (hypertension)     resistant htn. pt states that this has been poorly controlled for years.  . Diastolic HF (heart failure)     Echo (7/11): EF 60-65%, moderate LVH, severe diastolic dysfunction.   . Iron deficiency anemia   . Atrial fibrillation     Chronic. Has had cardioembolic right renal infarct in 12/06 and cardioembolic event to the right arm requiring right brachial embolectomy. Both events occurred while subtherapeutic on coumadin.  . Obese   . Cerebrovascular accident, embolic     left frontal  . Dyslipidemia   . Acute MI, anterior wall     Lexiscan myoview (9/11): EF 76%, normal wall motion, possible small anterior MI with mild peri-infarct . ischemia but cannot rule out breast attenuation.    Past Surgical History  Procedure Laterality Date  . Embolectomy      right brachial embolectomy   Family History  Problem Relation Age of Onset  . Hypertension      multipe relatives with Resistant HTN.    History  Substance Use Topics  . Smoking status: Never Smoker   . Smokeless tobacco: Not on file  . Alcohol Use: No   OB History   Grav Para Term Preterm Abortions TAB SAB Ect Mult Living                 Review of Systems  Unable to perform ROS Musculoskeletal: Negative for neck stiffness.  Neurological: Positive for headaches.      Allergies  Review of patient's allergies indicates no known allergies.  Home Medications   Current Outpatient Rx  Name  Route  Sig  Dispense  Refill  . amLODipine (NORVASC) 10 MG tablet   Oral   Take 10 mg by mouth daily.         Marland Kitchen atorvastatin (LIPITOR) 80 MG tablet   Oral   Take 80 mg by mouth daily at 6 PM.          . carvedilol (COREG) 12.5 MG tablet   Oral   Take 12.5 mg by mouth 2 (two) times daily.         . cloNIDine (CATAPRES) 0.3 MG tablet   Oral   Take 0.3 mg by mouth 3 (three) times daily.         . furosemide (LASIX) 40 MG tablet   Oral   Take 40 mg by mouth 2 (two) times daily.         Marland Kitchen lisinopril (  PRINIVIL,ZESTRIL) 40 MG tablet   Oral   Take 40 mg by mouth daily.         Marland Kitchen. warfarin (COUMADIN) 4 MG tablet   Oral   Take 2-4 mg by mouth every evening. Takes 2mg  on sun, tues, thurs, and sat. 4mg  on m,w,f          BP 119/73  Pulse 47  Temp(Src) 97.6 F (36.4 C) (Oral)  Resp 17  Wt 181 lb 6 oz (82.271 kg)  SpO2 100% Physical Exam  Constitutional: She appears well-developed.  HENT:  Head: Normocephalic.  Eyes: Pupils are equal, round, and reactive to light.  Neck: Neck supple.  Cardiovascular:  Irregular bradycardia  Pulmonary/Chest: Effort normal and breath sounds normal.  Abdominal: Soft. There is no tenderness.  Musculoskeletal: She exhibits edema.  Mild bilateral lower extremity pitting edema  Neurological: She is alert.  Somewhat slurred speech. Mild confusion moves all extremities  Skin: Skin is warm.   mild left-sided facial tube. Unsure of chronicity.   ED Course  Procedures  (including critical care time) Labs Review Labs Reviewed  CBC WITH DIFFERENTIAL - Abnormal; Notable for the following:    WBC 3.5 (*)    Platelets 117 (*)    Neutrophils Relative % 37 (*)    Neutro Abs 1.3 (*)    Lymphocytes Relative 51 (*)    All other components within normal limits  COMPREHENSIVE METABOLIC PANEL - Abnormal; Notable for the following:    Glucose, Bld 119 (*)    Creatinine, Ser 1.21 (*)    Albumin 3.3 (*)    GFR calc non Af Amer 41 (*)    GFR calc Af Amer 48 (*)    All other components within normal limits  PROTIME-INR - Abnormal; Notable for the following:    Prothrombin Time 24.3 (*)    INR 2.27 (*)    All other components within normal limits  TROPONIN I  URINALYSIS, ROUTINE W REFLEX MICROSCOPIC   Imaging Review Dg Chest 2 View  12/28/2013   CLINICAL DATA:  Altered mental status.  EXAM: CHEST  2 VIEW  COMPARISON:  PA and lateral chest 01/30/2013.  FINDINGS: Asymmetric elevation of the left hemidiaphragm is unchanged. Lungs are clear. There is cardiomegaly.  IMPRESSION: Cardiomegaly without acute disease.  Stable compared to prior exam.   Electronically Signed   By: Drusilla Kannerhomas  Dalessio M.D.   On: 12/28/2013 21:20   Ct Head Wo Contrast  12/28/2013   CLINICAL DATA:  Headache.  Altered mental status.  EXAM: CT HEAD WITHOUT CONTRAST  TECHNIQUE: Contiguous axial images were obtained from the base of the skull through the vertex without intravenous contrast.  COMPARISON:  05/07/2006  FINDINGS: No mass lesion. No midline shift. No acute hemorrhage or hematoma. No extra-axial fluid collections. No evidence of acute infarction. There are old white matter infarcts in the left frontal and parietal white matter. No ventricular dilatation. No acute osseous abnormality. Diffuse thickening of the calvarium is stable.  IMPRESSION: No acute intracranial abnormality.  Old white matter infarcts.   Electronically Signed   By: Geanie CooleyJim  Maxwell M.D.   On: 12/28/2013 21:46     EKG  Interpretation   Date/Time:  Tuesday December 28 2013 20:41:29 EDT Ventricular Rate:  68 PR Interval:    QRS Duration: 110 QT Interval:  447 QTC Calculation: 475 R Axis:   149 Text Interpretation:  Atrial fibrillation Lateral infarct, old Confirmed  by Rubin PayorPICKERING  MD, Naiara Lombardozzi [phone] on 12/29/2013 12:12:03 AM  MDM   Final diagnoses:  Altered mental status  Atrial fibrillation with slow ventricular response    Patient with some altered mental status confusion. Did have mild facial droop unknown if new or old. Head CT reassuring and she is on Coumadin. Lab work is overall reassuring. Patient has had a bradycardia. She is in atrial fibrillation but her rate would dip down to the 30s. Will admit to internal medicine.  Juliet Rude. Rubin Payor, MD 12/29/13 3244

## 2013-12-29 ENCOUNTER — Encounter (HOSPITAL_COMMUNITY): Payer: Self-pay | Admitting: Internal Medicine

## 2013-12-29 ENCOUNTER — Inpatient Hospital Stay (HOSPITAL_COMMUNITY): Payer: Medicare Other

## 2013-12-29 DIAGNOSIS — I509 Heart failure, unspecified: Secondary | ICD-10-CM

## 2013-12-29 DIAGNOSIS — I5032 Chronic diastolic (congestive) heart failure: Secondary | ICD-10-CM

## 2013-12-29 DIAGNOSIS — I369 Nonrheumatic tricuspid valve disorder, unspecified: Secondary | ICD-10-CM

## 2013-12-29 DIAGNOSIS — G9341 Metabolic encephalopathy: Secondary | ICD-10-CM | POA: Diagnosis present

## 2013-12-29 DIAGNOSIS — D696 Thrombocytopenia, unspecified: Secondary | ICD-10-CM | POA: Diagnosis present

## 2013-12-29 DIAGNOSIS — G9340 Encephalopathy, unspecified: Secondary | ICD-10-CM

## 2013-12-29 DIAGNOSIS — R4182 Altered mental status, unspecified: Secondary | ICD-10-CM

## 2013-12-29 DIAGNOSIS — D649 Anemia, unspecified: Secondary | ICD-10-CM

## 2013-12-29 DIAGNOSIS — R001 Bradycardia, unspecified: Secondary | ICD-10-CM | POA: Diagnosis present

## 2013-12-29 DIAGNOSIS — I4891 Unspecified atrial fibrillation: Secondary | ICD-10-CM

## 2013-12-29 DIAGNOSIS — I10 Essential (primary) hypertension: Secondary | ICD-10-CM

## 2013-12-29 DIAGNOSIS — I498 Other specified cardiac arrhythmias: Secondary | ICD-10-CM

## 2013-12-29 LAB — COMPREHENSIVE METABOLIC PANEL
ALT: 11 U/L (ref 0–35)
AST: 17 U/L (ref 0–37)
Albumin: 3 g/dL — ABNORMAL LOW (ref 3.5–5.2)
Alkaline Phosphatase: 82 U/L (ref 39–117)
BILIRUBIN TOTAL: 0.6 mg/dL (ref 0.3–1.2)
BUN: 19 mg/dL (ref 6–23)
CHLORIDE: 105 meq/L (ref 96–112)
CO2: 27 meq/L (ref 19–32)
Calcium: 9.2 mg/dL (ref 8.4–10.5)
Creatinine, Ser: 1.15 mg/dL — ABNORMAL HIGH (ref 0.50–1.10)
GFR, EST AFRICAN AMERICAN: 51 mL/min — AB (ref >90)
GFR, EST NON AFRICAN AMERICAN: 44 mL/min — AB (ref >90)
GLUCOSE: 114 mg/dL — AB (ref 70–99)
POTASSIUM: 3.3 meq/L — AB (ref 3.7–5.3)
Sodium: 144 mEq/L (ref 137–147)
Total Protein: 6.8 g/dL (ref 6.0–8.3)

## 2013-12-29 LAB — LIPID PANEL
Cholesterol: 95 mg/dL (ref 0–200)
HDL: 37 mg/dL — ABNORMAL LOW (ref >39)
LDL Cholesterol: 51 mg/dL (ref 0–99)
Total CHOL/HDL Ratio: 2.6 RATIO
Triglycerides: 36 mg/dL (ref <150)
VLDL: 7 mg/dL (ref 0–40)

## 2013-12-29 LAB — CBC WITH DIFFERENTIAL/PLATELET
BASOS ABS: 0 10*3/uL (ref 0.0–0.1)
BASOS PCT: 1 % (ref 0–1)
Eosinophils Absolute: 0.1 10*3/uL (ref 0.0–0.7)
Eosinophils Relative: 2 % (ref 0–5)
HEMATOCRIT: 37 % (ref 36.0–46.0)
Hemoglobin: 12 g/dL (ref 12.0–15.0)
Lymphocytes Relative: 49 % — ABNORMAL HIGH (ref 12–46)
Lymphs Abs: 1.4 10*3/uL (ref 0.7–4.0)
MCH: 29.9 pg (ref 26.0–34.0)
MCHC: 32.4 g/dL (ref 30.0–36.0)
MCV: 92.3 fL (ref 78.0–100.0)
MONO ABS: 0.4 10*3/uL (ref 0.1–1.0)
Monocytes Relative: 12 % (ref 3–12)
NEUTROS PCT: 36 % — AB (ref 43–77)
Neutro Abs: 1 10*3/uL — ABNORMAL LOW (ref 1.7–7.7)
Platelets: 117 10*3/uL — ABNORMAL LOW (ref 150–400)
RBC: 4.01 MIL/uL (ref 3.87–5.11)
RDW: 13.2 % (ref 11.5–15.5)
WBC: 2.9 10*3/uL — ABNORMAL LOW (ref 4.0–10.5)

## 2013-12-29 LAB — VITAMIN B12: VITAMIN B 12: 332 pg/mL (ref 211–911)

## 2013-12-29 LAB — MAGNESIUM: Magnesium: 2.2 mg/dL (ref 1.5–2.5)

## 2013-12-29 LAB — T4, FREE: Free T4: 1.43 ng/dL (ref 0.80–1.80)

## 2013-12-29 LAB — HEMOGLOBIN A1C
Hgb A1c MFr Bld: 5.4 % (ref <5.7)
Mean Plasma Glucose: 108 mg/dL (ref <117)

## 2013-12-29 LAB — FOLATE: Folate: 18.6 ng/mL

## 2013-12-29 LAB — AMMONIA: Ammonia: 35 umol/L (ref 11–60)

## 2013-12-29 LAB — SEDIMENTATION RATE: SED RATE: 20 mm/h (ref 0–22)

## 2013-12-29 LAB — RPR

## 2013-12-29 LAB — TSH: TSH: 0.15 u[IU]/mL — ABNORMAL LOW (ref 0.350–4.500)

## 2013-12-29 LAB — TROPONIN I: Troponin I: <0.3 ng/mL (ref <0.30)

## 2013-12-29 MED ORDER — LORAZEPAM 2 MG/ML IJ SOLN
1.0000 mg | Freq: Once | INTRAMUSCULAR | Status: AC
  Administered 2013-12-29: 1 mg via INTRAVENOUS

## 2013-12-29 MED ORDER — LISINOPRIL 40 MG PO TABS
40.0000 mg | ORAL_TABLET | Freq: Every day | ORAL | Status: DC
  Administered 2013-12-29 – 2014-01-01 (×4): 40 mg via ORAL
  Filled 2013-12-29 (×4): qty 1

## 2013-12-29 MED ORDER — LORAZEPAM 2 MG/ML IJ SOLN
INTRAMUSCULAR | Status: AC
  Filled 2013-12-29: qty 1

## 2013-12-29 MED ORDER — FUROSEMIDE 40 MG PO TABS
40.0000 mg | ORAL_TABLET | Freq: Two times a day (BID) | ORAL | Status: DC
  Filled 2013-12-29 (×3): qty 1

## 2013-12-29 MED ORDER — WARFARIN SODIUM 4 MG PO TABS
4.0000 mg | ORAL_TABLET | Freq: Once | ORAL | Status: AC
  Administered 2013-12-29: 4 mg via ORAL
  Filled 2013-12-29: qty 1

## 2013-12-29 MED ORDER — ATORVASTATIN CALCIUM 80 MG PO TABS
80.0000 mg | ORAL_TABLET | Freq: Every day | ORAL | Status: DC
  Administered 2013-12-29 – 2013-12-31 (×3): 80 mg via ORAL
  Filled 2013-12-29 (×4): qty 1

## 2013-12-29 MED ORDER — WARFARIN - PHARMACIST DOSING INPATIENT: Freq: Every day | Status: DC

## 2013-12-29 MED ORDER — HYDRALAZINE HCL 20 MG/ML IJ SOLN: 10.0000 mg | INTRAMUSCULAR | Status: DC | PRN

## 2013-12-29 MED ORDER — DIPHENHYDRAMINE HCL 12.5 MG/5ML PO ELIX
12.5000 mg | ORAL_SOLUTION | ORAL | Status: DC | PRN
  Filled 2013-12-29: qty 5

## 2013-12-29 MED ORDER — POTASSIUM CHLORIDE CRYS ER 20 MEQ PO TBCR
40.0000 meq | EXTENDED_RELEASE_TABLET | Freq: Once | ORAL | Status: AC
  Administered 2013-12-29: 40 meq via ORAL
  Filled 2013-12-29: qty 2

## 2013-12-29 MED ORDER — SENNOSIDES-DOCUSATE SODIUM 8.6-50 MG PO TABS
1.0000 | ORAL_TABLET | Freq: Every evening | ORAL | Status: DC | PRN
  Filled 2013-12-29: qty 1

## 2013-12-29 MED ORDER — AMLODIPINE BESYLATE 10 MG PO TABS
10.0000 mg | ORAL_TABLET | Freq: Every day | ORAL | Status: DC
  Administered 2013-12-29 – 2014-01-01 (×4): 10 mg via ORAL
  Filled 2013-12-29 (×4): qty 1

## 2013-12-29 NOTE — Progress Notes (Signed)
Pt was brought back from MRI because of combative and unsafe behavior Ativan was ordered and administered due to pt continued to be combative and trying to leave. Ilean SkillVeronica Capucine Tryon LPN

## 2013-12-29 NOTE — Progress Notes (Signed)
According to CCMT pt experienced a 4.13 pause with her heart rate dipping to 27. Pt nonsymptomatic and awakes and responds appropriately.

## 2013-12-29 NOTE — Progress Notes (Signed)
Utilization review completed.  

## 2013-12-29 NOTE — Progress Notes (Signed)
Patient Name: [name] Date of Encounter: 12/29/2013     Principal Problem:   Acute encephalopathy Active Problems:   Chronic diastolic heart failure   Atrial fibrillation with slow ventricular response   Bradycardia   Unspecified essential hypertension   Coronary artery disease   PAD (peripheral artery disease)   Thrombocytopenia, unspecified    SUBJECTIVE  Denies c/p, sob, or presyncope.  She isn't really sure why she is in the hospital.  Remains bradycardic on tele - mostly 40's to 50's.  Longest pause - 4.19 secs.  CURRENT MEDS . amLODipine  10 mg Oral Daily  . atorvastatin  80 mg Oral q1800  . lisinopril  40 mg Oral Daily  . warfarin  4 mg Oral ONCE-1800  . Warfarin - Pharmacist Dosing Inpatient   Does not apply q1800    OBJECTIVE  Filed Vitals:   12/29/13 0000 12/29/13 0305 12/29/13 0517 12/29/13 0700  BP: 115/65 117/93 104/77 141/99  Pulse: 73 46    Temp:  97.9 F (36.6 C)    TempSrc:  Oral    Resp: 15 17    Weight:      SpO2: 100% 100%  100%   No intake or output data in the 24 hours ending 12/29/13 0959 Filed Weights   12/28/13 1846  Weight: 181 lb 6 oz (82.271 kg)    PHYSICAL EXAM  General: Pleasant, NAD. Neuro: Disoriented to time. Moves all extremities spontaneously. Psych: Flat affect. HEENT:  Normal x poor dentition.  Neck: Supple without bruits or JVD. Lungs:  Resp regular and unlabored, diminished breath sounds bilat bases. Heart: ir, ir, brady no s3, s4, 2/6 syst murmur @ rusb. Abdomen: Soft, non-tender, non-distended, BS + x 4.  Extremities: No clubbing, cyanosis or edema. DP/PT/Radials 2+ and equal bilaterally.  Accessory Clinical Findings  CBC  Recent Labs  12/28/13 2035 12/29/13 0444  WBC 3.5* 2.9*  NEUTROABS 1.3* 1.0*  HGB 12.7 12.0  HCT 38.6 37.0  MCV 92.6 92.3  PLT 117* 117*   Basic Metabolic Panel  Recent Labs  12/28/13 2035 12/29/13 0444  NA 141 144  K 3.9 3.3*  CL 102 105  CO2 22 27  GLUCOSE 119*  114*  BUN 23 19  CREATININE 1.21* 1.15*  CALCIUM 9.4 9.2  MG  --  2.2   Liver Function Tests  Recent Labs  12/28/13 2035 12/29/13 0444  AST 23 17  ALT 14 11  ALKPHOS 91 82  BILITOT 0.7 0.6  PROT 7.4 6.8  ALBUMIN 3.3* 3.0*   Cardiac Enzymes  Recent Labs  12/28/13 2035 12/29/13 0444  TROPONINI <0.30 <0.30   Fasting Lipid Panel  Recent Labs  12/29/13 0444  CHOL 95  HDL 37*  LDLCALC 51  TRIG 36  CHOLHDL 2.6   Thyroid Function Tests  Recent Labs  12/29/13 0444  TSH 0.150*   Lab Results  Component Value Date   INR 2.27* 12/28/2013   INR 1.61* 12/07/2013   INR 1.9 02/22/2013    TELE  afib w/ slow vent response.  Rates mostly 40's to 50's with dips into 30's.  Multiple >3 sec pauses, longest 4.19 secs.  ECG  Afib, 36, >3 sec pause.  Radiology/Studies  Dg Chest 2 View  12/28/2013   CLINICAL DATA:  Altered mental status.  EXAM: CHEST  2 VIEW  COMPARISON:  PA and lateral chest 01/30/2013.  FINDINGS: Asymmetric elevation of the left hemidiaphragm is unchanged. Lungs are clear. There is cardiomegaly.  IMPRESSION:  Cardiomegaly without acute disease.  Stable compared to prior exam.   Electronically Signed   By: Drusilla Kannerhomas  Dalessio M.D.   On: 12/28/2013 21:20   Ct Head Wo Contrast  12/28/2013   CLINICAL DATA:  Headache.  Altered mental status.  EXAM: CT HEAD WITHOUT CONTRAST  TECHNIQUE: Contiguous axial images were obtained from the base of the skull through the vertex without intravenous contrast.  COMPARISON:  05/07/2006  FINDINGS: No mass lesion. No midline shift. No acute hemorrhage or hematoma. No extra-axial fluid collections. No evidence of acute infarction. There are old white matter infarcts in the left frontal and parietal white matter. No ventricular dilatation. No acute osseous abnormality. Diffuse thickening of the calvarium is stable.  IMPRESSION: No acute intracranial abnormality.  Old white matter infarcts.   Electronically Signed   By: Geanie CooleyJim  Maxwell M.D.   On:  12/28/2013 21:46   Dg Pelvis Portable  12/29/2013   CLINICAL DATA:  Hip pain.  EXAM: PORTABLE PELVIS 1-2 VIEWS  COMPARISON:  Abdominal radiographs 07/28/2008 and pelvic CT 08/22/2005  FINDINGS: There is no evidence of acute fracture. The femoral heads are approximated with the acetabula on these AP projections. There is no evidence of pelvic diastases. Mild degenerative changes are present at the pubic symphysis. Mild bilateral hip joint space narrowing is present, unchanged. Soft tissue calcification projecting lateral to the right hip joint is unchanged. Degenerative disc disease is partially visualized in the lower lumbar spine. A calcification projects in the left lower quadrant, likely unchanged from prior CT.  IMPRESSION: No evidence of acute osseous abnormality. Mild bilateral hip joint osteoarthrosis.   Electronically Signed   By: Sebastian AcheAllen  Grady   On: 12/29/2013 08:07    ASSESSMENT AND PLAN  1.  Afib with slow ventricular response:  BB and clonidine on hold.  Rates mostly 40's to low 50's.  4.19 sec pause earlier this AM.  Zoll pads on.  Hopefully can avoid pacing.  With AMS, hard to know at this point to what extent bradycardia playing a role in Ss, though she denies presyncope/syncope and BP's have been stable.  F/U echo.  TSH low - check FT4.  Supp K.  Cont anticoagulation - INR Rx.  2.  Chronic diast CHF:  Euvolemic.  HR slow.  BP ok- follow off of bb/clonidine.  3.  AMS:  Head CT neg.  No focal deficits on exam. MRI pending.  4.  HTN:  Stable.  Follow off of bb/clonidine.  Cont acei/ccb.  5.  CAD:  No c/p, trop neg.  Cont statin.  Bb on hold.  6.  ? Hyperthyroidism:  TSH suppressed.  F/U FT4 - per IM.  7.  Hypokalemia:  Supplement.  Mg wnl.  Signed, Ok Anishristopher R Berge NP  Patient seen, examined. Available data reviewed. Agree with findings, assessment, and plan as outlined by Ward Givenshris Berge, NP. I agree with his physical exam findings. The patient is a very poor historian. I have  reviewed her telemetry and heart rate is improved now off of a beta blocker and clonidine. I would continue to hold these medications. I don't think her bradycardia has played a significant role in her presenting symptoms. However, I think AV nodal blocking agents need to be held if she has had both pauses and significant bradycardia. Will continue to follow on telemetry.  Tonny BollmanMichael Emmelia Holdsworth, M.D. 12/29/2013 11:13 AM

## 2013-12-29 NOTE — Progress Notes (Signed)
Notified in report from the ED that the patient was A.Fib but brady. Lowest HR was 38 in the ED. Pt arrived on floor. Around 0230 and 0300 pt beginning to have frequent pauses still in A.Fib. HR registering down to 28. Dr. Toniann FailKakrakandy notified. Cards consulted. Thanks!

## 2013-12-29 NOTE — Progress Notes (Signed)
  Echocardiogram 2D Echocardiogram has been performed.  Arvil ChacoRachel Foster 12/29/2013, 1:28 PM

## 2013-12-29 NOTE — Progress Notes (Signed)
  ANTICOAGULATION CONSULT NOTE  Pharmacy Consult for Coumadin Indication: atrial fibrillation  No Known Allergies  Patient Measurements: Weight: 181 lb 6 oz (82.271 kg) Heparin Dosing Weight:   Vital Signs: Temp: 97.9 F (36.6 C) (04/08 0305) Temp src: Oral (04/08 0305) BP: 117/93 mmHg (04/08 0305) Pulse Rate: 46 (04/08 0305)  Labs:  Recent Labs  12/28/13 2035 12/28/13 2149  HGB 12.7  --   HCT 38.6  --   PLT 117*  --   LABPROT  --  24.3*  INR  --  2.27*  CREATININE 1.21*  --   TROPONINI <0.30  --     The CrCl is unknown because both a height and weight (above a minimum accepted value) are required for this calculation.   Medical History: Past Medical History  Diagnosis Date  . HTN (hypertension)     resistant htn. pt states that this has been poorly controlled for years.  . Diastolic HF (heart failure)     Echo (7/11): EF 60-65%, moderate LVH, severe diastolic dysfunction.   . Iron deficiency anemia   . Atrial fibrillation     Chronic. Has had cardioembolic right renal infarct in 12/06 and cardioembolic event to the right arm requiring right brachial embolectomy. Both events occurred while subtherapeutic on coumadin.  . Obese   . Cerebrovascular accident, embolic     left frontal  . Dyslipidemia   . Acute MI, anterior wall     Lexiscan myoview (9/11): EF 76%, normal wall motion, possible small anterior MI with mild peri-infarct . ischemia but cannot rule out breast attenuation.     Medications:  Prescriptions prior to admission  Medication Sig Dispense Refill  . amLODipine (NORVASC) 10 MG tablet Take 10 mg by mouth daily.      Marland Kitchen. atorvastatin (LIPITOR) 80 MG tablet Take 80 mg by mouth daily at 6 PM.       . carvedilol (COREG) 12.5 MG tablet Take 12.5 mg by mouth 2 (two) times daily.      . cloNIDine (CATAPRES) 0.3 MG tablet Take 0.3 mg by mouth 3 (three) times daily.      . furosemide (LASIX) 40 MG tablet Take 40 mg by mouth 2 (two) times daily.      Marland Kitchen.  lisinopril (PRINIVIL,ZESTRIL) 40 MG tablet Take 40 mg by mouth daily.      Marland Kitchen. warfarin (COUMADIN) 4 MG tablet Take 2-4 mg by mouth every evening. Takes 2mg  on sun, tues, thurs, and sat. 4mg  on m,w,f        Assessment: 80 yo female admitted with AMS, h/o Afib, to continue Coumadin.  Head CT negative for bleed.  Goal of Therapy:  INR 2-3 Monitor platelets by anticoagulation protocol: Yes   Plan:  Coumadin 4 mg po today Daily INR  Gary FleetGregory Vernon Sharnell Knight 12/29/2013,5:04 AM

## 2013-12-29 NOTE — Progress Notes (Signed)
[facility] PROGRESS NOTE Assessment/Plan: Acute encephalopathy - unclear etiology. - MRI pending to rule out a stroke. U/A clean, CXR: Cardiomegaly without acute disease. Stable compared to prior exam. - Pending  ammonia levels, TSH, RPR, B12 and folate. - has remained afebrile. Not hypoxic uremic, LFT's WNL, TSH < 0.5, she is on no medication which can cause confusion. Electrolytes WNL. - it is possible that her confusion can be due to severe bradycardia. - Has remained afebrile no source of infection, no leukocytosis.  Atrial fibrillation with slow ventricular response/  Bradycardia: - consulted cardiology, for HR < 30, recommended to hold BB and clonidine. - cont anticoagulation. INR therapeutic.  Chronic Thrombocytopenia, unspecified - stable.  PAD (peripheral artery disease) - coumadin.  Unspecified essential hypertension - controlled cont current therapy.  Chronic severe diastolic dysfunction with moderate biatrial enlargement - Euvolemic.    Code Status: Full code.  Family Communication: Patient's son.  Disposition Plan: Admit to inpatient.    Consultants:  cardiology  Procedures:  MRI  Antibiotics:  None  HPI/Subjective: No complains want to go home.  Objective: Filed Vitals:   12/29/13 0000 12/29/13 0305 12/29/13 0517 12/29/13 0700  BP: 115/65 117/93 104/77 141/99  Pulse: 73 46    Temp:  97.9 F (36.6 C)    TempSrc:  Oral    Resp: 15 17    Weight:      SpO2: 100% 100%  100%   No intake or output data in the 24 hours ending 12/29/13 0954 Filed Weights   12/28/13 1846  Weight: 82.271 kg (181 lb 6 oz)    Exam:  General: Alert, awake, oriented x3, in no acute distress.  HEENT: No bruits, no goiter.  Heart: Regular rate and rhythm, without murmurs, rubs, gallops.  Lungs: Good air movement, clear Abdomen: Soft, nontender, nondistended, positive bowel sounds.   Data Reviewed: Basic Metabolic Panel:  Recent Labs Lab  12/28/13 2035 12/29/13 0444  NA 141 144  K 3.9 3.3*  CL 102 105  CO2 22 27  GLUCOSE 119* 114*  BUN 23 19  CREATININE 1.21* 1.15*  CALCIUM 9.4 9.2  MG  --  2.2   Liver Function Tests:  Recent Labs Lab 12/28/13 2035 12/29/13 0444  AST 23 17  ALT 14 11  ALKPHOS 91 82  BILITOT 0.7 0.6  PROT 7.4 6.8  ALBUMIN 3.3* 3.0*   No results found for this basename: LIPASE, AMYLASE,  in the last 168 hours  Recent Labs Lab 12/29/13 0444  AMMONIA 35   CBC:  Recent Labs Lab 12/28/13 2035 12/29/13 0444  WBC 3.5* 2.9*  NEUTROABS 1.3* 1.0*  HGB 12.7 12.0  HCT 38.6 37.0  MCV 92.6 92.3  PLT 117* 117*   Cardiac Enzymes:  Recent Labs Lab 12/28/13 2035 12/29/13 0444  TROPONINI <0.30 <0.30   BNP (last 3 results)  Recent Labs  01/30/13 0258 08/24/13 1254  PROBNP 588.4* 140.0*   CBG: No results found for this basename: GLUCAP,  in the last 168 hours  No results found for this or any previous visit (from the past 240 hour(s)).   Studies: Dg Chest 2 View  12/28/2013   CLINICAL DATA:  Altered mental status.  EXAM: CHEST  2 VIEW  COMPARISON:  PA and lateral chest 01/30/2013.  FINDINGS: Asymmetric elevation of the left hemidiaphragm is unchanged. Lungs are clear. There is cardiomegaly.  IMPRESSION: Cardiomegaly without acute disease.  Stable compared to prior exam.   Electronically Signed   By: Maisie Fushomas  Dalessio M.D.   On: 12/28/2013 21:20   Ct Head Wo Contrast  12/28/2013   CLINICAL DATA:  Headache.  Altered mental status.  EXAM: CT HEAD WITHOUT CONTRAST  TECHNIQUE: Contiguous axial images were obtained from the base of the skull through the vertex without intravenous contrast.  COMPARISON:  05/07/2006  FINDINGS: No mass lesion. No midline shift. No acute hemorrhage or hematoma. No extra-axial fluid collections. No evidence of acute infarction. There are old white matter infarcts in the left frontal and parietal white matter. No ventricular dilatation. No acute osseous abnormality.  Diffuse thickening of the calvarium is stable.  IMPRESSION: No acute intracranial abnormality.  Old white matter infarcts.   Electronically Signed   By: Geanie Cooley M.D.   On: 12/28/2013 21:46   Dg Pelvis Portable  12/29/2013   CLINICAL DATA:  Hip pain.  EXAM: PORTABLE PELVIS 1-2 VIEWS  COMPARISON:  Abdominal radiographs 07/28/2008 and pelvic CT 08/22/2005  FINDINGS: There is no evidence of acute fracture. The femoral heads are approximated with the acetabula on these AP projections. There is no evidence of pelvic diastases. Mild degenerative changes are present at the pubic symphysis. Mild bilateral hip joint space narrowing is present, unchanged. Soft tissue calcification projecting lateral to the right hip joint is unchanged. Degenerative disc disease is partially visualized in the lower lumbar spine. A calcification projects in the left lower quadrant, likely unchanged from prior CT.  IMPRESSION: No evidence of acute osseous abnormality. Mild bilateral hip joint osteoarthrosis.   Electronically Signed   By: Sebastian Ache   On: 12/29/2013 08:07    Scheduled Meds: . amLODipine  10 mg Oral Daily  . atorvastatin  80 mg Oral q1800  . lisinopril  40 mg Oral Daily  . warfarin  4 mg Oral ONCE-1800  . Warfarin - Pharmacist Dosing Inpatient   Does not apply q1800   Continuous Infusions:    Marinda Elk  [facility] Pager [phone]. If 8PM-8AM, please contact night-coverage at [email or web address], password [facility] 12/29/2013, 9:54 AM  LOS: 1 day

## 2013-12-29 NOTE — Consult Note (Signed)
History and Physical  Patient ID: [name] MRN: [id number], DOB: 07-Jun-1933 Date of Encounter: 12/29/2013, 5:27 AM Primary Physician: PROVIDER NOT IN SYSTEM Primary Cardiologist: Dr. Marca Ancona   Chief Complaint: Headache and slurred speech  Reason for Admission: CVA   80 y.o. female with a hx of atrial fibrillation, diastolic CHF, PAD  HTN, and cardioembolic events to the right kidney, brain, and right arm. Echo in 2011 showed moderate LVH, EF 60-65%, and severe diastolic dysfunction with moderate biatrial enlargement. Lexiscan myoview in 2011 showed a possible small anterior MI with mild peri-infarct ischemia versus breast artifact. Currently she is admitted with concern for CVA and is now being seen by cardiology for slow HR   HPI : evaluated pt with nurse at bedside. Pt appears confused and disorientated . Unable to tell person , place , time or why she is admitted. Able to follow simple command and per nurse was getting up to go the bathroom with minimal assistance. apparently lives at home with her family and was brought in due to headache, slurred speech and facial droop. She was admitted for CVA workup and management. On tele she noted to be in Afib with slow vent rate in 30's for which consult was called. Pt had been asleep at that time. Per review of records pt has not had any syncopal/ presyncopal symptoms. No recent increase in her B-blocker dose     Past Medical History  Diagnosis Date  . HTN (hypertension)     resistant htn. pt states that this has been poorly controlled for years.  . Diastolic HF (heart failure)     Echo (7/11): EF 60-65%, moderate LVH, severe diastolic dysfunction.   . Iron deficiency anemia   . Atrial fibrillation     Chronic. Has had cardioembolic right renal infarct in 12/06 and cardioembolic event to the right arm requiring right brachial embolectomy. Both events occurred while subtherapeutic on coumadin.  . Obese   . Cerebrovascular accident,  embolic     left frontal  . Dyslipidemia   . Acute MI, anterior wall     Lexiscan myoview (9/11): EF 76%, normal wall motion, possible small anterior MI with mild peri-infarct . ischemia but cannot rule out breast attenuation.      Most Recent Cardiac Studies: EKG 12/2013 Afib with ventricular rate at 47  Echo 03/2010  - Left ventricle: The cavity size was normal. Wall thickness was increased in a pattern of moderate LVH. Systolic function was normal. The estimated ejection fraction was in the range of 60% to 65%. Wall motion was normal; there were no regional wall motion abnormalities. Doppler parameters are consistent with restrictive physiology, indicative of decreased left ventricular diastolic compliance and/or increased left atrial pressure. E/medial e' > 15 suggests LV end diastolic pressure at least 20 mmHg. - Aortic valve: There was no stenosis. - Mitral valve: Trivial regurgitation. - Left atrium: The atrium was moderately to severely dilated. - Right ventricle: The cavity size was normal. Systolic function was normal. - Right atrium: The atrium was moderately dilated. - Pulmonary arteries: PA peak pressure: 44mm Hg (S). - Systemic veins: IVC measured2.5 cm < 50% respirophasic variation, suggesting RA pressure around 15 mmHg. - Pericardium, extracardiac: Small to moderate pericardial effusion with some fibrinous organization primarily towards the apex. No evidence for tamponade. Impressions:  - The patient was in atrial fibrillation during this study. Normal LV size with moderate systolic dysfunction. EF 60-65%. Severe diastolic dysfunction with evidence for elevated LV filling  pressure. Mild pulmonary hypertension. Moderate to severe LA enlargement, moderate RA enlargement. Normal RV size and systolic function.     Surgical History:  Past Surgical History  Procedure Laterality Date  . Embolectomy      right brachial embolectomy     Home Meds: Prior to  Admission medications   Medication Sig Start Date End Date Taking? Authorizing Provider  amLODipine (NORVASC) 10 MG tablet Take 10 mg by mouth daily.   Yes Historical Provider, MD  atorvastatin (LIPITOR) 80 MG tablet Take 80 mg by mouth daily at 6 PM.    Yes Historical Provider, MD  carvedilol (COREG) 12.5 MG tablet Take 12.5 mg by mouth 2 (two) times daily.   Yes Historical Provider, MD  cloNIDine (CATAPRES) 0.3 MG tablet Take 0.3 mg by mouth 3 (three) times daily.   Yes Historical Provider, MD  furosemide (LASIX) 40 MG tablet Take 40 mg by mouth 2 (two) times daily.   Yes Historical Provider, MD  lisinopril (PRINIVIL,ZESTRIL) 40 MG tablet Take 40 mg by mouth daily.   Yes Historical Provider, MD  warfarin (COUMADIN) 4 MG tablet Take 2-4 mg by mouth every evening. Takes 2mg  on sun, tues, thurs, and sat. 4mg  on m,w,f   Yes Historical Provider, MD    Allergies: No Known Allergies  History   Social History  . Marital Status: Widowed    Spouse Name: N/A    Number of Children: 3  . Years of Education: N/A   Occupational History  . Not on file.   Social History Main Topics  . Smoking status: Never Smoker   . Smokeless tobacco: Not on file  . Alcohol Use: No  . Drug Use: Not on file  . Sexual Activity: Not on file   Other Topics Concern  . Not on file   Social History Narrative  . No narrative on file     Family History  Problem Relation Age of Onset  . Hypertension      multipe relatives with Resistant HTN.    Review of Systems: Unobtainable since pt is confused and disorientated   Labs:   Lab Results  Component Value Date   WBC 2.9* 12/29/2013   HGB 12.0 12/29/2013   HCT 37.0 12/29/2013   MCV 92.3 12/29/2013   PLT 117* 12/29/2013    Recent Labs Lab 12/28/13 2035  NA 141  K 3.9  CL 102  CO2 22  BUN 23  CREATININE 1.21*  CALCIUM 9.4  PROT 7.4  BILITOT 0.7  ALKPHOS 91  ALT 14  AST 23  GLUCOSE 119*    Recent Labs  12/28/13 2035  TROPONINI <0.30   Lab  Results  Component Value Date   CHOL 97 02/18/2012   HDL 38.20* 02/18/2012   LDLCALC 49 02/18/2012   TRIG 49.0 02/18/2012   No results found for this basename: DDIMER   INR 2.27  Radiology/Studies:  Dg Chest 2 View  12/28/2013   CLINICAL DATA:  Altered mental status.  EXAM: CHEST  2 VIEW  COMPARISON:  PA and lateral chest 01/30/2013.  FINDINGS: Asymmetric elevation of the left hemidiaphragm is unchanged. Lungs are clear. There is cardiomegaly.  IMPRESSION: Cardiomegaly without acute disease.  Stable compared to prior exam.   Electronically Signed   By: Drusilla Kannerhomas  Dalessio M.D.   On: 12/28/2013 21:20   Ct Head Wo Contrast  12/28/2013   CLINICAL DATA:  Headache.  Altered mental status.  EXAM: CT HEAD WITHOUT CONTRAST  TECHNIQUE: Contiguous axial images  were obtained from the base of the skull through the vertex without intravenous contrast.  COMPARISON:  05/07/2006  FINDINGS: No mass lesion. No midline shift. No acute hemorrhage or hematoma. No extra-axial fluid collections. No evidence of acute infarction. There are old white matter infarcts in the left frontal and parietal white matter. No ventricular dilatation. No acute osseous abnormality. Diffuse thickening of the calvarium is stable.  IMPRESSION: No acute intracranial abnormality.  Old white matter infarcts.   Electronically Signed   By: Geanie Cooley M.D.   On: 12/28/2013 21:46     Physical Exam: Blood pressure 104/77, pulse 46, temperature 97.9 F (36.6 C), temperature source Oral, resp. rate 17, weight 82.271 kg (181 lb 6 oz), SpO2 100.00%. General: Well developed, well nourished, in no acute distress. Head: Normocephalic, atraumatic, sclera non-icteric, no xanthomas, nares are without discharge.  Neck: Negative for carotid bruits. JVD not elevated. Lungs: Clear bilaterally to auscultation without wheezes, rales, or rhonchi. Breathing is unlabored. Heart:irregular . No murmurs, rubs, or gallops appreciated. Abdomen: Soft, non-tender,  non-distended with normoactive bowel sounds. No hepatomegaly. No rebound/guarding. No obvious abdominal masses. Extremities: No clubbing or cyanosis. No edema.  Distal pedal pulses are 2+ and equal bilaterally. Neuro: Alert  And disoriented to person, place time . Able to squeeze both hand and move legs with command     ASSESSMENT AND PLAN:  -Afib with slow ventricular response  -HF pEf - appears euvolemic -HTN - controlled   Hold b-blocker  and clonidine for now. Will monitor tele data to see if low dose b-blocker is needed in the future. Suspect that HR will likely be better during day time and activity  Continue anticoagulation  Further management and workup of CVA/ Altered mental status per primary team   Signed, Crissie Sickles M.D 12/29/2013, 5:27 AM

## 2013-12-29 NOTE — H&P (Addendum)
[facility] History and Physical  [name]:[id number] DOB: 1933-08-11 DOA: 12/28/2013  Referring physician: ER physician. PCP: PROVIDER NOT IN SYSTEM  Specialists: Dr. Marca Ancona.  Chief Complaint: Headache pain and slurred speech.  History obtained from patient's son as patient is confused.  HPI: [name] is a 80 y.o. female with history of atrial fibrillation on Coumadin, history of cardioembolic events, diastolic CHF last EF measured was 60-65%, hypertension was brought to the ER after patient was found to be complaining of headache left hip pain and patient's son noticed slurred speech. The time of onset is not clear. By the time patient reached ER patient was looking confused and ER physician felt the patient had left facial droop. Patient is moving all extremities though feels weak. CT head did not show anything acute. Patient telemetry monitor shows heart rates going down to the 40s. Patient at this time has been admitted for further workup including possible CVA. Patient did not complain of any chest pain or shortness of breath did not have any nausea vomiting. Has been having some left hip pain and also had complained of right sided headache.   Review of Systems: As presented in the history of presenting illness, rest negative.  Past Medical History  Diagnosis Date  . HTN (hypertension)     resistant htn. pt states that this has been poorly controlled for years.  . Diastolic HF (heart failure)     Echo (7/11): EF 60-65%, moderate LVH, severe diastolic dysfunction.   . Iron deficiency anemia   . Atrial fibrillation     Chronic. Has had cardioembolic right renal infarct in 12/06 and cardioembolic event to the right arm requiring right brachial embolectomy. Both events occurred while subtherapeutic on coumadin.  . Obese   . Cerebrovascular accident, embolic     left frontal  . Dyslipidemia   . Acute MI, anterior wall     Lexiscan myoview (9/11): EF 76%, normal  wall motion, possible small anterior MI with mild peri-infarct . ischemia but cannot rule out breast attenuation.    Past Surgical History  Procedure Laterality Date  . Embolectomy      right brachial embolectomy   Social History:  reports that she has never smoked. She does not have any smokeless tobacco history on file. She reports that she does not drink alcohol. Her drug history is not on file. Where does patient live home. Can patient participate in ADLs? Not sure.  No Known Allergies  Family History:  Family History  Problem Relation Age of Onset  . Hypertension      multipe relatives with Resistant HTN.      Prior to Admission medications   Medication Sig Start Date End Date Taking? Authorizing Provider  amLODipine (NORVASC) 10 MG tablet Take 10 mg by mouth daily.   Yes Historical Provider, MD  atorvastatin (LIPITOR) 80 MG tablet Take 80 mg by mouth daily at 6 PM.    Yes Historical Provider, MD  carvedilol (COREG) 12.5 MG tablet Take 12.5 mg by mouth 2 (two) times daily.   Yes Historical Provider, MD  cloNIDine (CATAPRES) 0.3 MG tablet Take 0.3 mg by mouth 3 (three) times daily.   Yes Historical Provider, MD  furosemide (LASIX) 40 MG tablet Take 40 mg by mouth 2 (two) times daily.   Yes Historical Provider, MD  lisinopril (PRINIVIL,ZESTRIL) 40 MG tablet Take 40 mg by mouth daily.   Yes Historical Provider, MD  warfarin (COUMADIN) 4 MG tablet Take  2-4 mg by mouth every evening. Takes 2mg  on sun, tues, thurs, and sat. 4mg  on m,w,f   Yes Historical Provider, MD    Physical Exam: Filed Vitals:   12/28/13 2330 12/28/13 2335 12/29/13 0000 12/29/13 0305  BP: 119/73  115/65 117/93  Pulse: 47  73 46  Temp:  97.6 F (36.4 C)  97.9 F (36.6 C)  TempSrc:    Oral  Resp: 17  15 17   Weight:      SpO2: 100%  100% 100%     General:  Well-developed and nourished.  Eyes: Anicteric no pallor.  ENT: No discharge from the ears eyes nose mouth.  Neck: No mass  felt.  Cardiovascular: S1-S2 heard.  Respiratory: No rhonchi crepitations.  Abdomen: Soft nontender bowel sounds present.  Skin: Skin changes in the anterior shin of the right leg.  Musculoskeletal: No edema. No obvious tenderness in the hip area or any obvious swelling in the hip area.  Psychiatric: Appears confused. H  Neurologic: Patient is alert but appears confused. Follows commands and moves all extremities. No obvious facial asymmetry. Tongue is midline.  Labs on Admission:  Basic Metabolic Panel:  Recent Labs Lab 12/28/13 2035  NA 141  K 3.9  CL 102  CO2 22  GLUCOSE 119*  BUN 23  CREATININE 1.21*  CALCIUM 9.4   Liver Function Tests:  Recent Labs Lab 12/28/13 2035  AST 23  ALT 14  ALKPHOS 91  BILITOT 0.7  PROT 7.4  ALBUMIN 3.3*   No results found for this basename: LIPASE, AMYLASE,  in the last 168 hours No results found for this basename: AMMONIA,  in the last 168 hours CBC:  Recent Labs Lab 12/28/13 2035  WBC 3.5*  NEUTROABS 1.3*  HGB 12.7  HCT 38.6  MCV 92.6  PLT 117*   Cardiac Enzymes:  Recent Labs Lab 12/28/13 2035  TROPONINI <0.30    BNP (last 3 results)  Recent Labs  01/30/13 0258 08/24/13 1254  PROBNP 588.4* 140.0*   CBG: No results found for this basename: GLUCAP,  in the last 168 hours  Radiological Exams on Admission: Dg Chest 2 View  12/28/2013   CLINICAL DATA:  Altered mental status.  EXAM: CHEST  2 VIEW  COMPARISON:  PA and lateral chest 01/30/2013.  FINDINGS: Asymmetric elevation of the left hemidiaphragm is unchanged. Lungs are clear. There is cardiomegaly.  IMPRESSION: Cardiomegaly without acute disease.  Stable compared to prior exam.   Electronically Signed   By: Drusilla Kanner M.D.   On: 12/28/2013 21:20   Ct Head Wo Contrast  12/28/2013   CLINICAL DATA:  Headache.  Altered mental status.  EXAM: CT HEAD WITHOUT CONTRAST  TECHNIQUE: Contiguous axial images were obtained from the base of the skull through the  vertex without intravenous contrast.  COMPARISON:  05/07/2006  FINDINGS: No mass lesion. No midline shift. No acute hemorrhage or hematoma. No extra-axial fluid collections. No evidence of acute infarction. There are old white matter infarcts in the left frontal and parietal white matter. No ventricular dilatation. No acute osseous abnormality. Diffuse thickening of the calvarium is stable.  IMPRESSION: No acute intracranial abnormality.  Old white matter infarcts.   Electronically Signed   By: Geanie Cooley M.D.   On: 12/28/2013 21:46    EKG: Independently reviewed. Atrial fibrillation with bradycardia.  Assessment/Plan Principal Problem:   Acute encephalopathy Active Problems:   Unspecified essential hypertension   Chronic diastolic heart failure   PAD (peripheral artery disease)  Atrial fibrillation with slow ventricular response   Thrombocytopenia, unspecified   Bradycardia   1. Acute encephalopathy with slurred speech and headache - on admission in the ED physician felt that patient had some left facial droop. At this time primary concern is to rule out CVA given patient's multiple episodes of cardioembolic events. I have discussed with on-call neurologist Dr. Amada JupiterKirkpatrick who had have advised to get MRI brain and if positive for stroke then consult neurologist. Check ammonia levels TSH RPR B12 and folate. Since patient had complained of headache check sedimentation rate. 2. Bradycardia with history of atrial fibrillation - patient's heart rate has decreased up to 28 when patient is sleeping. I have consulted cardiologist. We will hold off beta blockers and clonidine for now. Further recommendations per cardiologist. Check TSH. 3. Left hip pain - Xray pelvis ordered. I fpain persist and xray pelvis unremarkable then get CT abdomen and pelvis. 4. Hypertension - see #2. I am in addition placing patient on when necessary IV hydralazine for systolic blood pressure more than 200 given patient's  possible stroke. If stroke is ruled out we'll be more aggressive on blood pressure control. 5. Diastolic CHF - appears compensated. Since patient has possible stroke and low normal blood pressure I am holding Lasix for now. 6. Thrombocytopenia and leukopenia - closely follow CBC. 7. History of peripheral arterial disease.  I have discussed with on-call cardiologist. I have reviewed patient's old charts and labs.  Code Status: Full code.  Family Communication: Patient's son.  Disposition Plan: Admit to inpatient.    Eduard ClosArshad N Kakrakandy [facility] Pager [phone].  If 7PM-7AM, please contact night-coverage [email or web address] Password [email or web address] 12/29/2013, 3:28 AM

## 2013-12-30 ENCOUNTER — Encounter (HOSPITAL_COMMUNITY): Payer: Self-pay | Admitting: Physician Assistant

## 2013-12-30 DIAGNOSIS — R7989 Other specified abnormal findings of blood chemistry: Secondary | ICD-10-CM | POA: Diagnosis present

## 2013-12-30 DIAGNOSIS — E876 Hypokalemia: Secondary | ICD-10-CM | POA: Diagnosis not present

## 2013-12-30 DIAGNOSIS — R946 Abnormal results of thyroid function studies: Secondary | ICD-10-CM

## 2013-12-30 DIAGNOSIS — D72819 Decreased white blood cell count, unspecified: Secondary | ICD-10-CM | POA: Diagnosis present

## 2013-12-30 LAB — BASIC METABOLIC PANEL
BUN: 16 mg/dL (ref 6–23)
CO2: 24 meq/L (ref 19–32)
Calcium: 9.8 mg/dL (ref 8.4–10.5)
Chloride: 102 mEq/L (ref 96–112)
Creatinine, Ser: 1.06 mg/dL (ref 0.50–1.10)
GFR calc Af Amer: 56 mL/min — ABNORMAL LOW (ref >90)
GFR calc non Af Amer: 49 mL/min — ABNORMAL LOW (ref >90)
GLUCOSE: 87 mg/dL (ref 70–99)
Potassium: 3.9 mEq/L (ref 3.7–5.3)
Sodium: 142 mEq/L (ref 137–147)

## 2013-12-30 LAB — PROTIME-INR
INR: 2.32 — ABNORMAL HIGH (ref 0.00–1.49)
Prothrombin Time: 24.7 seconds — ABNORMAL HIGH (ref 11.6–15.2)

## 2013-12-30 LAB — TSH: TSH: 0.262 u[IU]/mL — ABNORMAL LOW (ref 0.350–4.500)

## 2013-12-30 MED ORDER — HALOPERIDOL LACTATE 5 MG/ML IJ SOLN
1.0000 mg | Freq: Four times a day (QID) | INTRAMUSCULAR | Status: DC | PRN
  Filled 2013-12-30: qty 1

## 2013-12-30 MED ORDER — WARFARIN SODIUM 4 MG PO TABS
4.0000 mg | ORAL_TABLET | ORAL | Status: DC
Start: 2013-12-31 — End: 2013-12-31
  Filled 2013-12-30: qty 1

## 2013-12-30 MED ORDER — HALOPERIDOL LACTATE 5 MG/ML IJ SOLN: 2.0000 mg | Freq: Once | INTRAMUSCULAR | Status: DC

## 2013-12-30 MED ORDER — LEVOTHYROXINE SODIUM 25 MCG PO TABS
25.0000 ug | ORAL_TABLET | Freq: Every day | ORAL | Status: DC
  Administered 2013-12-31 – 2014-01-01 (×2): 25 ug via ORAL
  Filled 2013-12-30 (×3): qty 1

## 2013-12-30 MED ORDER — WARFARIN SODIUM 2 MG PO TABS
2.0000 mg | ORAL_TABLET | ORAL | Status: AC
  Administered 2013-12-30: 2 mg via ORAL
  Filled 2013-12-30: qty 1

## 2013-12-30 NOTE — Progress Notes (Signed)
OT Cancellation Note  Patient Details Name: Lebron QuamMary L Pina MRN: [id number] DOB: 1933-02-28   Cancelled Treatment:    Reason Eval/Treat Not Completed: Other (comment) Pt confused this am. Had ativan due to combativeness in MRI. Will wait to discuss baseline status with family to determine if appropriate for OT. Nsg to page when family arrives. Thank you. Lorinda CreedHilary S [place] Mullett OlatheHilary Rian Koon, North CarolinaOTR/L  [phone] 12/30/2013 12/30/2013, 8:55 AM

## 2013-12-30 NOTE — Progress Notes (Signed)
Pt has some rt sided facial droop which was a new onset and the [facility] was on the unit She was asked to assess Pt. Np new orders were given will continue to monitor. Ilean SkillVeronica Genetta Fiero LPN

## 2013-12-30 NOTE — Progress Notes (Signed)
Patient: [name] / Admit Date: 12/28/2013 / Date of Encounter: 12/30/2013, 8:48 AM  Subjective  Still very confused. Denies pain. She is having to sit in the nurses station to be watched.  Objective   Telemetry: atrial fib CVR - longest pause in the last 24 hrs was about 3.5 sec. Yesterday AM between 5-6 had a 4.2sec pause.   2D Echo 12/29/13 - Left ventricle: The cavity size was normal. Systolic function was normal. The estimated ejection fraction was in the range of 55% to 60%. Wall motion was normal; there were no regional wall motion abnormalities. - Aortic valve: Mild diffuse thickening and calcification, consistent with sclerosis. - Mitral valve: Mild regurgitation. - Left atrium: The atrium was moderately dilated. - Right atrium: The atrium was moderately dilated. - Tricuspid valve: Moderate regurgitation. - Pulmonary arteries: PA peak pressure: 42mm Hg (S). Impressions: - The right ventricular systolic pressure was increased consistent with mild pulmonary hypertension.   Physical Exam: Blood pressure 131/90, pulse 100, temperature 97.4 F (36.3 C), temperature source Oral, resp. rate 20, weight 181 lb 6 oz (82.271 kg), SpO2 100.00%. General: Well developed, well nourished elderly AAF in no acute distress. Head: Normocephalic, atraumatic, sclera non-icteric, no xanthomas, nares are without discharge. Neck: JVP not elevated. Lungs: Poor inspiratory effort but what is heard is clear bilaterally to auscultation without wheezes, rales, or rhonchi. Breathing is unlabored. Heart: Irreg irreg, controlled rate, S1 S2 without murmurs, rubs, or gallops.  Abdomen: Soft, non-tender, non-distended with normoactive bowel sounds. No rebound/guarding. Extremities: No clubbing or cyanosis. No edema. Distal pedal pulses are 2+ and equal bilaterally. Neuro: Was not able to tell me her name, location or year. She was reciting her prior home address. Generally confused. Does follow simple  commands.  Intake/Output Summary (Last 24 hours) at 12/30/13 0848 Last data filed at 12/29/13 1900  Gross per 24 hour  Intake    240 ml  Output      0 ml  Net    240 ml    Inpatient Medications:  . amLODipine  10 mg Oral Daily  . atorvastatin  80 mg Oral q1800  . lisinopril  40 mg Oral Daily  . Warfarin - Pharmacist Dosing Inpatient   Does not apply q1800   Infusions:    Labs:  Recent Labs  12/28/13 2035 12/29/13 0444  NA 141 144  K 3.9 3.3*  CL 102 105  CO2 22 27  GLUCOSE 119* 114*  BUN 23 19  CREATININE 1.21* 1.15*  CALCIUM 9.4 9.2  MG  --  2.2    Recent Labs  12/28/13 2035 12/29/13 0444  AST 23 17  ALT 14 11  ALKPHOS 91 82  BILITOT 0.7 0.6  PROT 7.4 6.8  ALBUMIN 3.3* 3.0*    Recent Labs  12/28/13 2035 12/29/13 0444  WBC 3.5* 2.9*  NEUTROABS 1.3* 1.0*  HGB 12.7 12.0  HCT 38.6 37.0  MCV 92.6 92.3  PLT 117* 117*    Recent Labs  12/28/13 2035 12/29/13 0444  TROPONINI <0.30 <0.30   No components found with this basename: POCBNP,   Recent Labs  12/29/13 0444  HGBA1C 5.4     Radiology/Studies:  Dg Chest 2 View  12/28/2013   CLINICAL DATA:  Altered mental status.  EXAM: CHEST  2 VIEW  COMPARISON:  PA and lateral chest 01/30/2013.  FINDINGS: Asymmetric elevation of the left hemidiaphragm is unchanged. Lungs are clear. There is cardiomegaly.  IMPRESSION: Cardiomegaly without acute disease.  Stable  compared to prior exam.   Electronically Signed   By: Drusilla Kannerhomas  Dalessio M.D.   On: 12/28/2013 21:20   Ct Head Wo Contrast  12/28/2013   CLINICAL DATA:  Headache.  Altered mental status.  EXAM: CT HEAD WITHOUT CONTRAST  TECHNIQUE: Contiguous axial images were obtained from the base of the skull through the vertex without intravenous contrast.  COMPARISON:  05/07/2006  FINDINGS: No mass lesion. No midline shift. No acute hemorrhage or hematoma. No extra-axial fluid collections. No evidence of acute infarction. There are old white matter infarcts in the left  frontal and parietal white matter. No ventricular dilatation. No acute osseous abnormality. Diffuse thickening of the calvarium is stable.  IMPRESSION: No acute intracranial abnormality.  Old white matter infarcts.   Electronically Signed   By: Geanie CooleyJim  Maxwell M.D.   On: 12/28/2013 21:46   Mr Brain Wo Contrast  12/30/2013   CLINICAL DATA:  Stroke.  EXAM: MRI HEAD WITHOUT CONTRAST  TECHNIQUE: Multiplanar, diffusion weighted sequences of the brain and surrounding structures were obtained without intravenous contrast.  COMPARISON:  CT HEAD W/O CM dated 12/28/2013  FINDINGS: Per technologist, patient was unable to tolerate further imaging and the examination was prematurely terminated.  No reduced diffusion to suggest acute ischemia. No midline shift or mass effect. Left apparent parietal encephalomalacia. No midline shift or mass effect. The ventricles and sulci appear overall normal for patient's age.  IMPRESSION: Limited MRI of the brain, consisting of diffusion-weighted sequences. Should patient return for exam completion, an addendum will be made to this report.  No acute ischemia.  Apparent left parietal encephalomalacia which may reflect remote traumatic or ischemic event.   Electronically Signed   By: Awilda Metroourtnay  Bloomer   On: 12/30/2013 05:26   Dg Pelvis Portable  12/29/2013   CLINICAL DATA:  Hip pain.  EXAM: PORTABLE PELVIS 1-2 VIEWS  COMPARISON:  Abdominal radiographs 07/28/2008 and pelvic CT 08/22/2005  FINDINGS: There is no evidence of acute fracture. The femoral heads are approximated with the acetabula on these AP projections. There is no evidence of pelvic diastases. Mild degenerative changes are present at the pubic symphysis. Mild bilateral hip joint space narrowing is present, unchanged. Soft tissue calcification projecting lateral to the right hip joint is unchanged. Degenerative disc disease is partially visualized in the lower lumbar spine. A calcification projects in the left lower quadrant, likely  unchanged from prior CT.  IMPRESSION: No evidence of acute osseous abnormality. Mild bilateral hip joint osteoarthrosis.   Electronically Signed   By: Sebastian AcheAllen  Grady   On: 12/29/2013 08:07     Assessment and Plan  1. Acute encephalopathy - etiology unclear. Ammonia, RVR, B12, folate all normal. Head CT nonacute (old infarcts). MRI showing no acute ischemia; apparent L parietal encephalomalacia which may reflect remote traumatic or ischemic event. Not hypoxic, febrile. Mild CBC abnormalities of unclear contribution. Appreciate concomitant IM workup. It is difficult to know how much of her confusion has been attributed to her bradycardia, but BP has remained preserved and confusion persists despite normalized heart rate. Strongly consider neuro eval today. 2. Atrial fibrillation with slow ventricular response initially, with history of cardioembolic events to right kidney, brain, and right arm - rate creeping up off beta blocker/clonidine but still intermittent pauses. Continue to observe for now but I suspect we will need to end up re-adding low dose beta blocker. Longest pause seen so far has been 4.2 sec (yesterday). Continue Coumadin. 3. Chronic diast CHF - Euvolemic. .  4. HTN -  currently stable. 5. Possible CAD with abnl myoview 2011, managed conservatively: No c/p, trop neg. Cont statin.  Not on ASA due to being on Coumadin. 6. Abnormal TSH - suppressed. F/U FT4 normal - per IM.  7. Hypokalemia - recheck BMET in AM. 9. Leukopenia/thrombocytopenia -  present by last recent labs in epic. ? Etiology and how it relates to presentation. 10. Probable CKD stage III - stable.  Signed, Ronie Spies PA-C  Patient seen, examined. Available data reviewed. Agree with findings, assessment, and plan as outlined by Ronie Spies, PA-C. Exam reveals a confused elderly woman in no distress. Heart is irregularly irregular. There is no peripheral edema. Lungs are clear. Telemetry reviewed and demonstrates heart rate now  in the 60s. Her primary issue is acute confusion. I think her cardiac situation is stable. I would keep her off of all AV nodal blockers at this point. Will continue to follow with you. She has had some asymptomatic pauses, but this is clearly unrelated to her overall clinical picture.  Tonny Bollman, M.D. 12/30/2013 11:17 AM

## 2013-12-30 NOTE — Progress Notes (Signed)
[facility] PROGRESS NOTE Assessment/Plan: Acute encephalopathy - Unclear etiology. - MRI Limited MRI of the brain, consisting of diffusion-weighted, repeat MRI use haldol. - family relates she has a change in mental status for 6 months. - U/A clean, CXR: Cardiomegaly without acute disease. Stable compared to prior exam. - Pending  ammonia levels, TSH, RPR, B12 and folate. - start synthroid. - It is possible that her confusion can be due to severe bradycardia. - Has remained afebrile no source of infection, no leukocytosis.  Atrial fibrillation with slow ventricular response/  Bradycardia: - consulted cardiology, for HR < 30, recommended to hold BB and clonidine. Now HR improved. - cont anticoagulation. INR therapeutic. - Echo showed: estimated ejection fraction was in the range of 55% to 60%  Chronic Thrombocytopenia, unspecified - stable.  PAD (peripheral artery disease) - coumadin.  Unspecified essential hypertension - controlled cont current therapy.  Chronic severe diastolic dysfunction with moderate biatrial enlargement - Euvolemic.    Code Status: Full code.  Family Communication: Patient's son.  Disposition Plan: Admit to inpatient.    Consultants:  cardiology  Procedures:  MRI  Antibiotics:  None  HPI/Subjective: No complains want to go home.  Objective: Filed Vitals:   12/29/13 1100 12/29/13 1600 12/29/13 2200 12/30/13 0500  BP: 129/84 122/82 131/90   Pulse:  93 81 100  Temp:  98.8 F (37.1 C) 98.5 F (36.9 C) 97.4 F (36.3 C)  TempSrc:  Oral Oral Oral  Resp:  16 20 20   Weight:      SpO2: 100% 100% 100% 100%    Intake/Output Summary (Last 24 hours) at 12/30/13 1258 Last data filed at 12/29/13 1900  Gross per 24 hour  Intake    240 ml  Output      0 ml  Net    240 ml   Filed Weights   12/28/13 1846  Weight: 82.271 kg (181 lb 6 oz)    Exam:  General: Alert, awake, oriented x2, in no acute distress.  HEENT: No bruits,  no goiter.  Heart: Regular rate and rhythm, without murmurs, rubs, gallops.  Lungs: Good air movement, clear Abdomen: Soft, nontender, nondistended, positive bowel sounds.   Data Reviewed: Basic Metabolic Panel:  Recent Labs Lab 12/28/13 2035 12/29/13 0444 12/30/13 0613  NA 141 144 142  K 3.9 3.3* 3.9  CL 102 105 102  CO2 22 27 24   GLUCOSE 119* 114* 87  BUN 23 19 16   CREATININE 1.21* 1.15* 1.06  CALCIUM 9.4 9.2 9.8  MG  --  2.2  --    Liver Function Tests:  Recent Labs Lab 12/28/13 2035 12/29/13 0444  AST 23 17  ALT 14 11  ALKPHOS 91 82  BILITOT 0.7 0.6  PROT 7.4 6.8  ALBUMIN 3.3* 3.0*   No results found for this basename: LIPASE, AMYLASE,  in the last 168 hours  Recent Labs Lab 12/29/13 0444  AMMONIA 35   CBC:  Recent Labs Lab 12/28/13 2035 12/29/13 0444  WBC 3.5* 2.9*  NEUTROABS 1.3* 1.0*  HGB 12.7 12.0  HCT 38.6 37.0  MCV 92.6 92.3  PLT 117* 117*   Cardiac Enzymes:  Recent Labs Lab 12/28/13 2035 12/29/13 0444  TROPONINI <0.30 <0.30   BNP (last 3 results)  Recent Labs  01/30/13 0258 08/24/13 1254  PROBNP 588.4* 140.0*   CBG: No results found for this basename: GLUCAP,  in the last 168 hours  No results found for this or any previous visit (from the past  240 hour(s)).   Studies: Dg Chest 2 View  12/28/2013   CLINICAL DATA:  Altered mental status.  EXAM: CHEST  2 VIEW  COMPARISON:  PA and lateral chest 01/30/2013.  FINDINGS: Asymmetric elevation of the left hemidiaphragm is unchanged. Lungs are clear. There is cardiomegaly.  IMPRESSION: Cardiomegaly without acute disease.  Stable compared to prior exam.   Electronically Signed   By: Drusilla Kanner M.D.   On: 12/28/2013 21:20   Ct Head Wo Contrast  12/28/2013   CLINICAL DATA:  Headache.  Altered mental status.  EXAM: CT HEAD WITHOUT CONTRAST  TECHNIQUE: Contiguous axial images were obtained from the base of the skull through the vertex without intravenous contrast.  COMPARISON:   05/07/2006  FINDINGS: No mass lesion. No midline shift. No acute hemorrhage or hematoma. No extra-axial fluid collections. No evidence of acute infarction. There are old white matter infarcts in the left frontal and parietal white matter. No ventricular dilatation. No acute osseous abnormality. Diffuse thickening of the calvarium is stable.  IMPRESSION: No acute intracranial abnormality.  Old white matter infarcts.   Electronically Signed   By: Geanie Cooley M.D.   On: 12/28/2013 21:46   Mr Brain Wo Contrast  12/30/2013   CLINICAL DATA:  Stroke.  EXAM: MRI HEAD WITHOUT CONTRAST  TECHNIQUE: Multiplanar, diffusion weighted sequences of the brain and surrounding structures were obtained without intravenous contrast.  COMPARISON:  CT HEAD W/O CM dated 12/28/2013  FINDINGS: Per technologist, patient was unable to tolerate further imaging and the examination was prematurely terminated.  No reduced diffusion to suggest acute ischemia. No midline shift or mass effect. Left apparent parietal encephalomalacia. No midline shift or mass effect. The ventricles and sulci appear overall normal for patient's age.  IMPRESSION: Limited MRI of the brain, consisting of diffusion-weighted sequences. Should patient return for exam completion, an addendum will be made to this report.  No acute ischemia.  Apparent left parietal encephalomalacia which may reflect remote traumatic or ischemic event.   Electronically Signed   By: Awilda Metro   On: 12/30/2013 05:26   Dg Pelvis Portable  12/29/2013   CLINICAL DATA:  Hip pain.  EXAM: PORTABLE PELVIS 1-2 VIEWS  COMPARISON:  Abdominal radiographs 07/28/2008 and pelvic CT 08/22/2005  FINDINGS: There is no evidence of acute fracture. The femoral heads are approximated with the acetabula on these AP projections. There is no evidence of pelvic diastases. Mild degenerative changes are present at the pubic symphysis. Mild bilateral hip joint space narrowing is present, unchanged. Soft tissue  calcification projecting lateral to the right hip joint is unchanged. Degenerative disc disease is partially visualized in the lower lumbar spine. A calcification projects in the left lower quadrant, likely unchanged from prior CT.  IMPRESSION: No evidence of acute osseous abnormality. Mild bilateral hip joint osteoarthrosis.   Electronically Signed   By: Sebastian Ache   On: 12/29/2013 08:07    Scheduled Meds: . amLODipine  10 mg Oral Daily  . atorvastatin  80 mg Oral q1800  . lisinopril  40 mg Oral Daily  . Warfarin - Pharmacist Dosing Inpatient   Does not apply q1800   Continuous Infusions:    Marinda Elk  [facility] Pager [phone]. If 8PM-8AM, please contact night-coverage at [email or web address], password [facility] 12/30/2013, 12:58 PM  LOS: 2 days

## 2013-12-30 NOTE — Progress Notes (Signed)
ANTICOAGULATION CONSULT NOTE - Follow Up Consult  Pharmacy Consult for coumadin Indication: atrial fibrillation  No Known Allergies  Patient Measurements: Weight: 181 lb 6 oz (82.271 kg)   Vital Signs: Temp: 97.4 F (36.3 C) (04/09 0500) Temp src: Oral (04/09 0500) Pulse Rate: 100 (04/09 0500)  Labs:  Recent Labs  12/28/13 2035 12/28/13 2149 12/29/13 0444 12/30/13 0613  HGB 12.7  --  12.0  --   HCT 38.6  --  37.0  --   PLT 117*  --  117*  --   LABPROT  --  24.3*  --  24.7*  INR  --  2.27*  --  2.32*  CREATININE 1.21*  --  1.15* 1.06  TROPONINI <0.30  --  <0.30  --     The CrCl is unknown because both a height and weight (above a minimum accepted value) are required for this calculation.  Assessment: Patient is a 80 y.o F on coumadin for afib.  INR is therapeutic at 2.32.  No bleeding documented.  Goal of Therapy:  INR 2-3    Plan:  1) resume home dose of 2mg  daily except 4mg  on MWF  Wilber Fini P Damonique Brunelle 12/30/2013,2:17 PM

## 2013-12-30 NOTE — Evaluation (Signed)
Physical Therapy Evaluation Patient Details Name: [name] MRN: [id number] DOB: 02/15/1933 Today's Date: 12/30/2013   History of Present Illness  80 y.o. female admitted to [facility] on 12/28/13 with HA, left hip pain, and slurred speech.  Pt dx with acute encephalopathy.  CT negative for acute events.  MRI showed, "Apparent left parietal encephalomalacia which may reflect remote traumatic or ischemic event".  Cardiology following for A-fib and slow ventricular response and pauses.  Pt with signficant PMHx of HTN, diastolic HF, anemia, A-fib, L frontal CVA, CAD, PAD, and right brachial embolectomy.    Clinical Impression  Pt very mobile and happy to walk with PT.  She is not oriented and is not able to tell me much (and ? Accuracy about home situation).  She is likely pretty close to her mobility baseline, but PT to follow acutely to make sure.  RN CM to check on 24/7 assist.   PT to follow acutely for deficits listed below.       Follow Up Recommendations Supervision/Assistance - 24 hour    Equipment Recommendations  Rolling walker with 5" wheels    Recommendations for Other Services   None    Precautions / Restrictions Precautions Precautions: Fall Precaution Comments: mildly unsteady on her feet and decreased cognition.       Mobility  Bed Mobility Overal bed mobility: Modified Independent             General bed mobility comments: Pt go to EOB with bed rail and HOB minimally elevated.   Transfers Overall transfer level: Needs assistance Equipment used: Rolling walker (2 wheeled) Transfers: Sit to/from Stand Sit to Stand: Min guard         General transfer comment: Min guard assist, pt quick to move before therapist ready.  Verbal cues for safe hand placement as pt is pulling on RW with bil upper extremities.   Ambulation/Gait Ambulation/Gait assistance: Min assist Ambulation Distance (Feet): 180 Feet Assistive device: Rolling walker (2 wheeled) Gait  Pattern/deviations: Step-through pattern;Shuffle   Gait velocity interpretation: at or above normal speed for age/gender General Gait Details: Pt really only needed assist to steer RW through crowded hallway. Very minimal assist to support trunk for balance. Pt unable to problem solve that she is in the hospital even with context clues in the hallway.          Balance Overall balance assessment: Needs assistance Sitting-balance support: No upper extremity supported;Feet supported Sitting balance-Leahy Scale: Good     Standing balance support: Bilateral upper extremity supported Standing balance-Leahy Scale: Fair Standing balance comment: pt is able to release RW and maintain balance, but has difficulty accepting challenge without reaching for external support.                              Pertinent Vitals/Pain See vitals flow sheet.     Home Living Family/patient expects to be discharged to:: Private residence Living Arrangements: Children               Additional Comments: Per RN CM, pt from home with family.  Unsure of 24/7 assist. Unsure of cognitive baseline    Prior Function Level of Independence: Independent with assistive device(s)         Comments: Per pt she used RW, did well during my session with RW.         Extremity/Trunk Assessment   Upper Extremity Assessment: Overall WFL for tasks assessed  Lower Extremity Assessment: Overall WFL for tasks assessed      Cervical / Trunk Assessment: Normal  Communication   Communication: No difficulties  Cognition Arousal/Alertness: Awake/alert Behavior During Therapy: WFL for tasks assessed/performed (laughing and smiling throughout session, pleasantly confused) Overall Cognitive Status: No family/caregiver present to determine baseline cognitive functioning                               Assessment/Plan    PT Assessment Patient needs continued PT services  PT  Diagnosis Abnormality of gait;Altered mental status   PT Problem List Decreased balance;Decreased mobility;Decreased cognition;Decreased knowledge of use of DME;Decreased safety awareness;Decreased knowledge of precautions  PT Treatment Interventions DME instruction;Gait training;Stair training;Functional mobility training;Therapeutic activities;Therapeutic exercise;Balance training;Neuromuscular re-education;Cognitive remediation;Patient/family education   PT Goals (Current goals can be found in the Care Plan section) Acute Rehab PT Goals Patient Stated Goal: unable to state PT Goal Formulation: Patient unable to participate in goal setting Time For Goal Achievement: 01/13/14 Potential to Achieve Goals: Good    Frequency Min 3X/week   Barriers to discharge   unsure of home situation.  RN CM looking into it.         End of Session Equipment Utilized During Treatment: Gait belt Activity Tolerance: Patient tolerated treatment well Patient left: in chair;with call bell/phone within reach;with nursing/sitter in room           Time: [id number] PT Time Calculation (min): 10 min   Charges:   PT Evaluation $Initial PT Evaluation Tier I: 1 Procedure          Johnjoseph Rolfe B. Daniel Ritthaler, PT, DPT [phone]   12/30/2013, 12:55 PM

## 2013-12-30 NOTE — Progress Notes (Signed)
Occupational Therapy Evaluation Patient Details Name: [name] MRN: [id number] DOB: Jun 28, 1933 Today's Date: 12/30/2013    History of Present Illness 80 y.o. female admitted to [facility] on 12/28/13 with HA, left hip pain, and slurred speech.  Pt dx with acute encephalopathy.  CT negative for acute events.  MRI showed, "Apparent left parietal encephalomalacia which may reflect remote traumatic or ischemic event".  Pt with signficant PMHx of HTN, diastolic HF, anemia, A-fib, L frontal CVA, CAD, PAD, and right brachial embolectomy.     Clinical Impression   PTA, pt lived with son, who is disabled, and was mod I with mobility and self care. Son states pt has had increased bouts of confusion for 2 months. Discussed D/C plan with family. Son wants to take pt home. Rec pt D/C home with 24/7 S of family.  Pt will benefit from skilled OT services to facilitate D/C to home with 24/7 assistance due  to below deficits. Son states he can provide necessary level of care.     Follow Up Recommendations  Home health OT;Supervision/Assistance - 24 hour    Equipment Recommendations  3 in 1 bedside comode    Recommendations for Other Services       Precautions / Restrictions Precautions Precautions: Fall Precaution Comments: mildly unsteady on her feet and decreased cognition.       Mobility Bed Mobility Overal bed mobility: Modified Independent             General bed mobility comments: Pt go to EOB with bed rail and HOB minimally elevated.   Transfers Overall transfer level: Needs assistance Equipment used: Rolling walker (2 wheeled) Transfers: Sit to/from [employer] Sit to Stand: Min assist (LOB posteriorly)         General transfer comment: min A    Balance Overall balance assessment: Needs assistance Sitting-balance support: Feet supported;No upper extremity supported Sitting balance-Leahy Scale: Good     Standing balance support: Bilateral upper extremity  supported;During functional activity;No upper extremity supported Standing balance-Leahy Scale: Fair Standing balance comment: pt is able to release RW and maintain balance, but has difficulty accepting challenge without reaching for external support.                             ADL Overall ADL's : Needs assistance/impaired Eating/Feeding: Set up;Supervision/ safety Eating/Feeding Details (indicate cue type and reason): encouagement for intake Grooming: Supervision/safety;Set up   Upper Body Bathing: Set up;Supervision/ safety;Sitting   Lower Body Bathing: Minimal assistance;Sit to/from stand   Upper Body Dressing : Set up;Supervision/safety;Sitting   Lower Body Dressing: Moderate assistance;Sit to/from stand   Toilet Transfer: Minimal assistance;Ambulation;Comfort height toilet Toilet Transfer Details (indicate cue type and reason): ambulated to bathroom Toileting- Clothing Manipulation and Hygiene: Minimal assistance;Sit to/from stand Toileting - Clothing Manipulation Details (indicate cue type and reason): min a for balance and safety     Functional mobility during ADLs: Minimal assistance (would not use RW) General ADL Comments: functional decline     Vision                     Perception     Praxis      Pertinent Vitals/Pain no apparent distress      Hand Dominance     Extremity/Trunk Assessment Upper Extremity Assessment Upper Extremity Assessment: Overall WFL for tasks assessed   Lower Extremity Assessment Lower Extremity Assessment: Overall WFL for tasks assessed   Cervical /  Trunk Assessment Cervical / Trunk Assessment: Normal   Communication Communication Communication: No difficulties   Cognition Arousal/Alertness: Awake/alert Behavior During Therapy: Flat affect Overall Cognitive Status: Impaired/Different from baseline Area of Impairment: Orientation;Attention;Memory;Following commands;Safety/judgement;Awareness;Problem  solving Orientation Level: Disoriented to;Place;Time;Situation Current Attention Level: Sustained Memory: Decreased recall of precautions;Decreased short-term memory Following Commands: Follows one step commands with increased time Safety/Judgement: Decreased awareness of safety;Decreased awareness of deficits Awareness: Intellectual Problem Solving: Slow processing;Decreased initiation;Difficulty sequencing;Requires verbal cues General Comments: son states this is a big change cognitively, but that has had increased confusion for @ 2 months   General Comments       Exercises       Shoulder Instructions      Home Living Family/patient expects to be discharged to:: Private residence Living Arrangements: Spouse/significant other Available Help at Discharge: Family;Available 24 hours/day Type of Home: House Home Access: Level entry     Home Layout: One level     Bathroom Shower/Tub: Tub/shower unit Shower/tub characteristics: Engineer, building servicesCurtain Bathroom Toilet: Standard Bathroom Accessibility: Yes How Accessible: Accessible via walker Home Equipment: Walker - 2 wheels;Cane - single point   Additional Comments: Per RN CM, pt from home with family.  Unsure of 24/7 assist. Unsure of cognitive baseline      Prior Functioning/Environment Level of Independence: Independent (Lived with son who is disabled. son assisted with meals,)        Comments: Per pt she used RW, did well during my session with RW.     OT Diagnosis: Generalized weakness;Cognitive deficits   OT Problem List: Decreased activity tolerance;Decreased cognition;Decreased safety awareness   OT Treatment/Interventions: Self-care/ADL training;Therapeutic exercise;DME and/or AE instruction;Therapeutic activities;Cognitive remediation/compensation;Patient/family education;Balance training    OT Goals(Current goals can be found in the care plan section) Acute Rehab OT Goals Patient Stated Goal: unable to state OT Goal  Formulation: With family Time For Goal Achievement: 01/13/14 Potential to Achieve Goals: Good  OT Frequency: Min 2X/week   Barriers to D/C:            Co-evaluation              End of Session Equipment Utilized During Treatment: Gait belt Nurse Communication: Mobility status  Activity Tolerance: Patient tolerated treatment well Patient left: in chair;with call bell/phone within reach;with nursing/sitter in room;with family/visitor present   Time: 1334-1400 OT Time Calculation (min): 26 min Charges:  OT General Charges $OT Visit: 1 Procedure OT Evaluation $Initial OT Evaluation Tier I: 1 Procedure OT Treatments $Self Care/Home Management : 8-22 mins G-Codes:    Lorinda CreedHilary S Janissa Bertram 12/30/2013, 2:09 PM [facility] Meaghann Choo, OTR/L  [phone] 12/30/2013

## 2013-12-31 ENCOUNTER — Inpatient Hospital Stay (HOSPITAL_COMMUNITY): Payer: Medicare Other

## 2013-12-31 DIAGNOSIS — Z7901 Long term (current) use of anticoagulants: Secondary | ICD-10-CM

## 2013-12-31 LAB — BASIC METABOLIC PANEL
BUN: 12 mg/dL (ref 6–23)
CHLORIDE: 104 meq/L (ref 96–112)
CO2: 24 mEq/L (ref 19–32)
CREATININE: 0.9 mg/dL (ref 0.50–1.10)
Calcium: 9.5 mg/dL (ref 8.4–10.5)
GFR calc Af Amer: 69 mL/min — ABNORMAL LOW (ref >90)
GFR calc non Af Amer: 59 mL/min — ABNORMAL LOW (ref >90)
GLUCOSE: 78 mg/dL (ref 70–99)
POTASSIUM: 3.8 meq/L (ref 3.7–5.3)
Sodium: 143 mEq/L (ref 137–147)

## 2013-12-31 LAB — CBC
HEMATOCRIT: 44.3 % (ref 36.0–46.0)
Hemoglobin: 14.6 g/dL (ref 12.0–15.0)
MCH: 30 pg (ref 26.0–34.0)
MCHC: 33 g/dL (ref 30.0–36.0)
MCV: 91 fL (ref 78.0–100.0)
Platelets: 140 10*3/uL — ABNORMAL LOW (ref 150–400)
RBC: 4.87 MIL/uL (ref 3.87–5.11)
RDW: 13.2 % (ref 11.5–15.5)
WBC: 3.9 10*3/uL — ABNORMAL LOW (ref 4.0–10.5)

## 2013-12-31 LAB — PROTIME-INR
INR: 2.58 — AB (ref 0.00–1.49)
Prothrombin Time: 26.8 seconds — ABNORMAL HIGH (ref 11.6–15.2)

## 2013-12-31 LAB — T4, FREE: Free T4: 1.66 ng/dL (ref 0.80–1.80)

## 2013-12-31 MED ORDER — METOPROLOL TARTRATE 12.5 MG HALF TABLET
12.5000 mg | ORAL_TABLET | Freq: Two times a day (BID) | ORAL | Status: DC
  Filled 2013-12-31 (×4): qty 1

## 2013-12-31 MED ORDER — QUETIAPINE FUMARATE 25 MG PO TABS
25.0000 mg | ORAL_TABLET | Freq: Two times a day (BID) | ORAL | Status: DC | PRN
Start: 2013-12-31 — End: 2014-01-01
  Administered 2013-12-31: 25 mg via ORAL
  Filled 2013-12-31 (×2): qty 1

## 2013-12-31 MED ORDER — WARFARIN SODIUM 2 MG PO TABS
2.0000 mg | ORAL_TABLET | ORAL | Status: DC
  Filled 2013-12-31: qty 1

## 2013-12-31 MED ORDER — WARFARIN SODIUM 4 MG PO TABS
4.0000 mg | ORAL_TABLET | ORAL | Status: DC
  Administered 2013-12-31: 4 mg via ORAL
  Filled 2013-12-31: qty 1

## 2013-12-31 NOTE — Progress Notes (Signed)
OT Cancellation Note  Patient Details Name: Bonnie QuamMary L Littlepage MRN: [id number] DOB: March 10, 1933   Cancelled Treatment:    Reason Eval/Treat Not Completed: Patient at procedure or test/ unavailable.   Harolyn RutherfordJessica B Amal Saiki Pager: [phone]: [phone]  12/31/2013, 11:01 AM

## 2013-12-31 NOTE — Progress Notes (Signed)
ANTICOAGULATION CONSULT NOTE - Follow Up Consult  Pharmacy Consult for coumadin Indication: atrial fibrillation  No Known Allergies  Patient Measurements: Weight: 181 lb 6 oz (82.271 kg)   Vital Signs: Temp: 98.4 F (36.9 C) (04/10 0604) Temp src: Oral (04/10 0604) BP: 144/75 mmHg (04/10 0604) Pulse Rate: 66 (04/10 0604)  Labs:  Recent Labs  12/28/13 2035 12/28/13 2149 12/29/13 0444 12/30/13 0613 12/31/13 0559  HGB 12.7  --  12.0  --  14.6  HCT 38.6  --  37.0  --  44.3  PLT 117*  --  117*  --  140*  LABPROT  --  24.3*  --  24.7* 26.8*  INR  --  2.27*  --  2.32* 2.58*  CREATININE 1.21*  --  1.15* 1.06 0.90  TROPONINI <0.30  --  <0.30  --   --     The CrCl is unknown because both a height and weight (above a minimum accepted value) are required for this calculation.  Assessment: Patient is a [age] y.o F on coumadin for Afib.  INR remains therapeutic at 2.58.  No bleeding documented.  Goal of Therapy:  INR 2-3    Plan:  1) continue home regimen of 2mg  daly except 4mg  on MWF  Thierno Hun P Shayanna Thatch 12/31/2013,1:16 PM

## 2013-12-31 NOTE — Progress Notes (Signed)
Attempted to give pt po metoprolol and seroquel as ordered. Pt very agitated and refused meds. Over the course of an hr, pt calmed down but still refused meds. HR down to the 80s to 90s on its own. Will cont to monitor

## 2013-12-31 NOTE — Progress Notes (Signed)
PT Cancellation Note  Patient Details Name: Bonnie QuamMary L Stephens MRN: [id number] DOB: 01-Apr-1933   Cancelled Treatment:    Reason Eval/Treat Not Completed: Patient at procedure or test/unavailable. Pt off unit for MRI, will attempt PT later as appropriate   [name] 12/31/2013, 11:27 AM

## 2013-12-31 NOTE — Progress Notes (Signed)
Pt came back from MRI as began to get agitated saying that we are all "trying to kill her and everyone says she gets to go home but we are lying." Pts heart rate has jumped to 160s at times and then sustaining in the 120s. Dr. Vanessa BarbaraZamora notified and is coming to see pt. Pts family at bedside and said this behavior is normal for her. Will cont to monitor heart rate. Safety sitter at bedside.

## 2013-12-31 NOTE — Evaluation (Signed)
Speech Language Pathology Evaluation Patient Details Name: [name] MRN: [id number] DOB: October 10, 1932 Today's Date: 12/31/2013 Time: 4098-1191 SLP Time Calculation (min): 24 min  Problem List:  Patient Active Problem List   Diagnosis Date Noted  . Leukopenia 12/30/2013  . Abnormal TSH 12/30/2013  . Hypokalemia 12/30/2013  . Acute encephalopathy 12/29/2013  . Atrial fibrillation with slow ventricular response 12/29/2013  . Thrombocytopenia, unspecified 12/29/2013  . Bradycardia 12/29/2013  . Encounter for long-term (current) use of anticoagulants 01/24/2011  . Possible CAD - abnl myoview 2011, managed conservatively 01/17/2011  . PAD (peripheral artery disease) 01/17/2011  . Chronic diastolic heart failure 07/23/2010  . ANEMIA 06/19/2009  . DYSLIPIDEMIA 06/02/2007  . Hypertension 06/02/2007   Past Medical History:  Past Medical History  Diagnosis Date  . HTN (hypertension)     a. Resistant HTN. Patient states that this has been poorly controlled for years.   . Diastolic HF (heart failure)     Echo (7/11): EF 60-65%, moderate LVH, severe diastolic dysfunction.   . Iron deficiency anemia   . Chronic atrial fibrillation     a. Chronic. Has had cardioembolic right renal infarct in 12/06 and cardioembolic event to the right arm requiring right brachial embolectomy. Both events occurred while subtherapeutic on coumadin. b. Slow VR 12/2013.  . Obese   . Cerebrovascular accident, embolic     left frontal  . Dyslipidemia   . CAD (coronary artery disease)     a. Possible dx -  Lexiscan myoview (9/11): EF 76%, normal wall motion, possible small anterior MI with mild peri-infarct ischemia but cannot rule out breast attenuation, managed conservatively.  Marland Kitchen PAD (peripheral artery disease)     a. peripheral arterial dopplers (5/12) with right mid popliteal artery occlusion with reconstitution of arteries below the knees by collaterals. Only mild reduction in ABI on the right (not  tissue-threatening).   Past Surgical History:  Past Surgical History  Procedure Laterality Date  . Embolectomy      right brachial embolectomy   HPI:  80 y.o. female admitted [facility] on 12/28/13 with HA, left hip pain, and slurred speech.  Pt dx with acute encephalopathy with unclear etiology. CT negative for acute events.  MRI showed, "Apparent left parietal encephalomalacia which may reflect remote traumatic or ischemic event".  Pt with signficant PMHx of HTN, diastolic HF, anemia, A-fib, L frontal CVA, CAD, PAD, and right brachial embolectomy.   Assessment / Plan / Recommendation Clinical Impression  Pt presents with diffuse cognitive deficits marked by impaired attention, difficulty following simple commands, disorientation x4, impaired short-term and long-term recall.  Pt requires cues to focus, sustain, and shift attention.  Affect is flat and spontaneous communication is decreased.  She has difficulty recalling sons' names.  Tearful and repetitive re: being "taken downstairs and left there." (referring to radiology.)  Speech is generally clear. Recommend trial SLP for cognition; etiology of encephalopathy remains unclear.      SLP Assessment  Patient needs continued Speech Lanaguage Pathology Services    Follow Up Recommendations  Skilled Nursing facility    Frequency and Duration min 2x/week  2 weeks      SLP Goals  SLP Goals Potential to Achieve Goals: Fair  SLP Evaluation Prior Functioning  Cognitive/Linguistic Baseline: Baseline deficits Baseline deficit details: memory Type of Home: House  Lives With: Son Available Help at Discharge: Family;Available 24 hours/day   Cognition  Overall Cognitive Status: Impaired/Different from baseline Arousal/Alertness: Awake/alert Orientation Level: Disoriented X4 Attention: Focused Focused Attention:  Impaired Focused Attention Impairment: Verbal basic;Functional basic Memory: Impaired Memory Impairment: Storage deficit;Retrieval  deficit;Decreased recall of new information;Decreased long term memory Decreased Long Term Memory: Verbal basic Awareness: Impaired Awareness Impairment: Intellectual impairment Problem Solving: Impaired Problem Solving Impairment: Verbal basic;Functional basic Behaviors: Poor frustration tolerance Safety/Judgment: Impaired    Comprehension  Auditory Comprehension Overall Auditory Comprehension: Impaired    Expression Expression Primary Mode of Expression: Verbal Verbal Expression Overall Verbal Expression: Impaired Naming: No impairment Written Expression Written Expression: Not tested   Oral / Motor Oral Motor/Sensory Function Overall Oral Motor/Sensory Function: Appears within functional limits for tasks assessed Motor Speech Overall Motor Speech: Appears within functional limits for tasks assessed   GO     Carolan Shivermanda Laurice Lillionna Nabi 12/31/2013, 3:55 PM

## 2013-12-31 NOTE — Progress Notes (Signed)
[facility] PROGRESS NOTE Assessment/Plan: Acute encephalopathy - Unclear etiology. - MRI Limited MRI of the brain, consisting of diffusion-weighted, repeat MRI showing no acute. - family relates she has a change in mental status for 6 months. - U/A clean, CXR: Cardiomegaly without acute disease. Stable compared to prior exam. - Pending  ammonia levels, TSH, RPR, B12 and folate. - start synthroid. - Has remained afebrile no source of infection, no leukocytosis.  Atrial fibrillation with slow ventricular response/  Bradycardia: - Patient's heart rates now increased into the low 100s, have started metoprolol 12.5 mg by mouth twice a day - cont anticoagulation. INR therapeutic. - Echo showed: estimated ejection fraction was in the range of 55% to 60%  Psychosis -Patient is agitated, psychotic, paranoid during my encounter -Start Seroquel 25 mg by mouth twice a day as needed for psychosis  Chronic Thrombocytopenia, unspecified - stable.  PAD (peripheral artery disease) - coumadin.  Unspecified essential hypertension - controlled cont current therapy.  Chronic severe diastolic dysfunction with moderate biatrial enlargement - Euvolemic.    Code Status: Full code.  Family Communication: Patient's son.  Disposition Plan: Admit to inpatient.    Consultants:  cardiology  Procedures:  MRI  Antibiotics:  None  HPI/Subjective: Patient is agitated, confused, paranoid, wants to go home. She became tearful during my encounter. Sons present at bedside  Objective: Filed Vitals:   12/29/13 2200 12/30/13 0500 12/30/13 2100 12/31/13 0604  BP: 131/90  141/100 144/75  Pulse: 81 100 65 66  Temp: 98.5 F (36.9 C) 97.4 F (36.3 C) 98.9 F (37.2 C) 98.4 F (36.9 C)  TempSrc: Oral Oral  Oral  Resp: 20 20 15    Weight:      SpO2: 100% 100% 100% 93%    Intake/Output Summary (Last 24 hours) at 12/31/13 1757 Last data filed at 12/31/13 0834  Gross per 24 hour    Intake    600 ml  Output      0 ml  Net    600 ml   Filed Weights   12/28/13 1846  Weight: 82.271 kg (181 lb 6 oz)    Exam:  General: Patient in moderate distress, tearful  HEENT: No bruits, no goiter.  Heart: Regular rate and rhythm, without murmurs, rubs, gallops.  Lungs: Good air movement, clear Abdomen: Soft, nontender, nondistended, positive bowel sounds.   Data Reviewed: Basic Metabolic Panel:  Recent Labs Lab 12/28/13 2035 12/29/13 0444 12/30/13 0613 12/31/13 0559  NA 141 144 142 143  K 3.9 3.3* 3.9 3.8  CL 102 105 102 104  CO2 22 27 24 24   GLUCOSE 119* 114* 87 78  BUN 23 19 16 12   CREATININE 1.21* 1.15* 1.06 0.90  CALCIUM 9.4 9.2 9.8 9.5  MG  --  2.2  --   --    Liver Function Tests:  Recent Labs Lab 12/28/13 2035 12/29/13 0444  AST 23 17  ALT 14 11  ALKPHOS 91 82  BILITOT 0.7 0.6  PROT 7.4 6.8  ALBUMIN 3.3* 3.0*   No results found for this basename: LIPASE, AMYLASE,  in the last 168 hours  Recent Labs Lab 12/29/13 0444  AMMONIA 35   CBC:  Recent Labs Lab 12/28/13 2035 12/29/13 0444 12/31/13 0559  WBC 3.5* 2.9* 3.9*  NEUTROABS 1.3* 1.0*  --   HGB 12.7 12.0 14.6  HCT 38.6 37.0 44.3  MCV 92.6 92.3 91.0  PLT 117* 117* 140*   Cardiac Enzymes:  Recent Labs Lab 12/28/13 2035 12/29/13  0444  TROPONINI <0.30 <0.30   BNP (last 3 results)  Recent Labs  01/30/13 0258 08/24/13 1254  PROBNP 588.4* 140.0*   CBG: No results found for this basename: GLUCAP,  in the last 168 hours  No results found for this or any previous visit (from the past 240 hour(s)).   Studies: Mr Brain Wo Contrast  12/31/2013   CLINICAL DATA:  Dementia. Acute encephalopathy. Etiology indeterminate.  EXAM: MRI HEAD WITHOUT CONTRAST  TECHNIQUE: Multiplanar, multiecho pulse sequences of the brain and surrounding structures were obtained without intravenous contrast.  COMPARISON:  12/29/2013 MR. 12/28/2013 CT.  FINDINGS: Exam is motion degraded.  No acute  infarct.  Remote infarct left parietal lobe with encephalomalacia.  Small vessel disease type changes.  No intracranial hemorrhage.  No intracranial mass lesion noted on this unenhanced exam.  No hydrocephalus.  Opacification mastoid air cells bilaterally. Opacification left middle ear cavity. No obstructing lesion noted at the level of the posterior superior nasopharynx causing eustachian tube dysfunction.  Minimal to mild paranasal sinus mucosal thickening.  Hyperostosis frontalis interna.  Cervical spondylotic changes with spinal stenosis and mild cord flattening C3-4 and spinal stenosis C4-5.  Transverse ligament hypertrophy. Cervical medullary junction unremarkable.  Pituitary region, pineal region and orbital structures unremarkable.  IMPRESSION: Exam is motion degraded.  No acute infarct.  Remote infarct left parietal lobe with encephalomalacia.  Small vessel disease type changes.  Opacification mastoid air cells bilaterally. Opacification left middle ear cavity.  Minimal to mild paranasal sinus mucosal thickening.  Hyperostosis frontalis interna.  Cervical spondylotic changes with spinal stenosis and mild cord flattening C3-4 and spinal stenosis C4-5.   Electronically Signed   By: Bridgett LarssonSteve  Olson M.D.   On: 12/31/2013 12:25   Mr Brain Wo Contrast  12/30/2013   CLINICAL DATA:  Stroke.  EXAM: MRI HEAD WITHOUT CONTRAST  TECHNIQUE: Multiplanar, diffusion weighted sequences of the brain and surrounding structures were obtained without intravenous contrast.  COMPARISON:  CT HEAD W/O CM dated 12/28/2013  FINDINGS: Per technologist, patient was unable to tolerate further imaging and the examination was prematurely terminated.  No reduced diffusion to suggest acute ischemia. No midline shift or mass effect. Left apparent parietal encephalomalacia. No midline shift or mass effect. The ventricles and sulci appear overall normal for patient's age.  IMPRESSION: Limited MRI of the brain, consisting of diffusion-weighted  sequences. Should patient return for exam completion, an addendum will be made to this report.  No acute ischemia.  Apparent left parietal encephalomalacia which may reflect remote traumatic or ischemic event.   Electronically Signed   By: Awilda Metroourtnay  Bloomer   On: 12/30/2013 05:26    Scheduled Meds: . amLODipine  10 mg Oral Daily  . atorvastatin  80 mg Oral q1800  . haloperidol lactate  2 mg Intravenous Once  . levothyroxine  25 mcg Oral QAC breakfast  . lisinopril  40 mg Oral Daily  . metoprolol tartrate  12.5 mg Oral BID  . [START ON 01/01/2014] warfarin  2 mg Oral Once per day on Sun Tue Thu Sat  . warfarin  4 mg Oral Q M,W,F-1800  . Warfarin - Pharmacist Dosing Inpatient   Does not apply q1800   Continuous Infusions:    Jeralyn BennettEzequiel Zayra Devito  [facility] Pager [phone]. If 8PM-8AM, please contact night-coverage at [email or web address], password [email or web address] 12/31/2013, 5:57 PM  LOS: 3 days

## 2013-12-31 NOTE — Progress Notes (Signed)
Patient: [name] / Admit Date: 12/28/2013 / Date of Encounter: 12/31/2013, 9:37 AM  Subjective  Remains confused. Denies CP or SOB.  Objective   Telemetry: Atrial fibrillation with controlled rate. One 2.8 sec pause overnight. Longest pause seen so far on floor has been 4.8sec on 4/8  Physical Exam: Blood pressure 144/75, pulse 66, temperature 98.4 F (36.9 C), temperature source Oral, resp. rate 15, weight 181 lb 6 oz (82.271 kg), SpO2 93.00%. General: Well developed, well nourished elderly AAF in no acute distress.  Head: Normocephalic, atraumatic, sclera non-icteric, no xanthomas, nares are without discharge.  Neck: JVP not elevated.  Lungs: Poor inspiratory effort but what is heard is clear bilaterally to auscultation without wheezes, rales, or rhonchi. Breathing is unlabored.  Heart: Irreg irreg, controlled rate, S1 S2 without murmurs, rubs, or gallops.  Abdomen: Soft, non-tender, non-distended with normoactive bowel sounds. No rebound/guarding.  Extremities: No clubbing or cyanosis. No edema. Distal pedal pulses are 2+ and equal bilaterally.  Neuro: Knows her name. Does not know location or year. She would repeat her name when asked other questions. Remains generally confused. Does follow simple commands.   Intake/Output Summary (Last 24 hours) at 12/31/13 0937 Last data filed at 12/31/13 0834  Gross per 24 hour  Intake    600 ml  Output      0 ml  Net    600 ml    Inpatient Medications:  . amLODipine  10 mg Oral Daily  . atorvastatin  80 mg Oral q1800  . haloperidol lactate  2 mg Intravenous Once  . levothyroxine  25 mcg Oral QAC breakfast  . lisinopril  40 mg Oral Daily  . warfarin  4 mg Oral Q M,W,F-1800  . Warfarin - Pharmacist Dosing Inpatient   Does not apply q1800   Infusions:    Labs:  Recent Labs  12/28/13 2035 12/29/13 0444 12/30/13 0613 12/31/13 0559  NA 141 144 142 143  K 3.9 3.3* 3.9 3.8  CL 102 105 102 104  CO2 22 27 24 24   GLUCOSE 119*  114* 87 78  BUN 23 19 16 12   CREATININE 1.21* 1.15* 1.06 0.90  CALCIUM 9.4 9.2 9.8 9.5  MG  --  2.2  --   --     Recent Labs  12/28/13 2035 12/29/13 0444  AST 23 17  ALT 14 11  ALKPHOS 91 82  BILITOT 0.7 0.6  PROT 7.4 6.8  ALBUMIN 3.3* 3.0*    Recent Labs  12/28/13 2035 12/29/13 0444 12/31/13 0559  WBC 3.5* 2.9* 3.9*  NEUTROABS 1.3* 1.0*  --   HGB 12.7 12.0 14.6  HCT 38.6 37.0 44.3  MCV 92.6 92.3 91.0  PLT 117* 117* 140*    Recent Labs  12/28/13 2035 12/29/13 0444  TROPONINI <0.30 <0.30    Recent Labs  12/29/13 0444  HGBA1C 5.4     Radiology/Studies:  Dg Chest 2 View  12/28/2013   CLINICAL DATA:  Altered mental status.  EXAM: CHEST  2 VIEW  COMPARISON:  PA and lateral chest 01/30/2013.  FINDINGS: Asymmetric elevation of the left hemidiaphragm is unchanged. Lungs are clear. There is cardiomegaly.  IMPRESSION: Cardiomegaly without acute disease.  Stable compared to prior exam.   Electronically Signed   By: Drusilla Kanner M.D.   On: 12/28/2013 21:20   Ct Head Wo Contrast  12/28/2013   CLINICAL DATA:  Headache.  Altered mental status.  EXAM: CT HEAD WITHOUT CONTRAST  TECHNIQUE: Contiguous axial images were  obtained from the base of the skull through the vertex without intravenous contrast.  COMPARISON:  05/07/2006  FINDINGS: No mass lesion. No midline shift. No acute hemorrhage or hematoma. No extra-axial fluid collections. No evidence of acute infarction. There are old white matter infarcts in the left frontal and parietal white matter. No ventricular dilatation. No acute osseous abnormality. Diffuse thickening of the calvarium is stable.  IMPRESSION: No acute intracranial abnormality.  Old white matter infarcts.   Electronically Signed   By: Geanie CooleyJim  Maxwell M.D.   On: 12/28/2013 21:46   Mr Brain Wo Contrast  12/30/2013   CLINICAL DATA:  Stroke.  EXAM: MRI HEAD WITHOUT CONTRAST  TECHNIQUE: Multiplanar, diffusion weighted sequences of the brain and surrounding structures  were obtained without intravenous contrast.  COMPARISON:  CT HEAD W/O CM dated 12/28/2013  FINDINGS: Per technologist, patient was unable to tolerate further imaging and the examination was prematurely terminated.  No reduced diffusion to suggest acute ischemia. No midline shift or mass effect. Left apparent parietal encephalomalacia. No midline shift or mass effect. The ventricles and sulci appear overall normal for patient's age.  IMPRESSION: Limited MRI of the brain, consisting of diffusion-weighted sequences. Should patient return for exam completion, an addendum will be made to this report.  No acute ischemia.  Apparent left parietal encephalomalacia which may reflect remote traumatic or ischemic event.   Electronically Signed   By: Awilda Metroourtnay  Bloomer   On: 12/30/2013 05:26   Dg Pelvis Portable  12/29/2013   CLINICAL DATA:  Hip pain.  EXAM: PORTABLE PELVIS 1-2 VIEWS  COMPARISON:  Abdominal radiographs 07/28/2008 and pelvic CT 08/22/2005  FINDINGS: There is no evidence of acute fracture. The femoral heads are approximated with the acetabula on these AP projections. There is no evidence of pelvic diastases. Mild degenerative changes are present at the pubic symphysis. Mild bilateral hip joint space narrowing is present, unchanged. Soft tissue calcification projecting lateral to the right hip joint is unchanged. Degenerative disc disease is partially visualized in the lower lumbar spine. A calcification projects in the left lower quadrant, likely unchanged from prior CT.  IMPRESSION: No evidence of acute osseous abnormality. Mild bilateral hip joint osteoarthrosis.   Electronically Signed   By: Sebastian AcheAllen  Grady   On: 12/29/2013 08:07     Assessment and Plan  1. Acute encephalopathy - etiology unclear. Ammonia, RVR, B12, folate all normal. Head CT nonacute (old infarcts). MRI showing no acute ischemia; apparent L parietal encephalomalacia which may reflect remote traumatic or ischemic event. Not hypoxic, febrile.  Mild CBC abnormalities of unclear contribution. Appreciate concomitant IM workup. It is difficult to know how much of her confusion has been attributed to her bradycardia, but BP has remained preserved and confusion persists despite normalized heart rate so doubt any contribution at this point. Consider neuro eval today. She will need SNF. 2. Atrial fibrillation with slow ventricular response initially, with history of cardioembolic events to right kidney, brain, and right arm - rate creeping up off beta blocker/clonidine but still intermittent shorter pauses. Continue to observe for now. May need to eventually add back low dose BB but would hold off for now. Longest pause seen so far has been 4.2 sec on 4/8. Continue Coumadin.  3. Chronic diast CHF - Euvolemic. .  4. HTN - currently stable.  5. Possible CAD with abnl myoview 2011, managed conservatively: No c/p, trop neg. Cont statin. Not on ASA due to being on Coumadin.  6. Abnormal TSH - suppressed. F/U FT4 normal -  per IM.  7. Hypokalemia - improved. 9. Leukopenia/thrombocytopenia - present by last recent labs in epic. ? Etiology and how it relates to presentation.  10. Probable CKD stage III - stable.  See above. Will sign off. Please call with questions.  Signed, Ronie Spies PA-C  Patient seen, examined. Available data reviewed. Agree with findings, assessment, and plan as outlined by Ronie Spies, PA-C. Tele reviewed. Exam reveals confused elderly woman in NAD. Lungs clear, heart irreg irreg. No peripheral edema. Heart rate has normalized off of beta-blocker. It is clear that bradycardia is playing no role in this patient's confusion/mental status. Really nothing else to add from cardiac perspective. Please call if we can be of any further assistance. thx  Tonny Bollman, M.D. 12/31/2013 11:59 AM

## 2013-12-31 NOTE — Discharge Instructions (Signed)
Information on my medicine - Coumadin®   (Warfarin) ° °This medication education was reviewed with me or my healthcare representative as part of my discharge preparation.  The pharmacist that spoke with me during my hospital stay was:  Hiedi Touchton P Jasmin Winberry, RPH ° °Why was Coumadin prescribed for you? °Coumadin was prescribed for you because you have a blood clot or a medical condition that can cause an increased risk of forming blood clots. Blood clots can cause serious health problems by blocking the flow of blood to the heart, lung, or brain. Coumadin can prevent harmful blood clots from forming. °As a reminder your indication for Coumadin is:   Stroke Prevention Because Of Atrial Fibrillation ° °What test will check on my response to Coumadin? °While on Coumadin (warfarin) you will need to have an INR test regularly to ensure that your dose is keeping you in the desired range. The INR (international normalized ratio) number is calculated from the result of the laboratory test called prothrombin time (PT). ° °If an INR APPOINTMENT HAS NOT ALREADY BEEN MADE FOR YOU please schedule an appointment to have this lab work done by your health care provider within 7 days. °Your INR goal is usually a number between:  2 to 3 or your provider may give you a more narrow range like 2-2.5.  Ask your health care provider during an office visit what your goal INR is. ° °What  do you need to  know  About  COUMADIN? °Take Coumadin (warfarin) exactly as prescribed by your healthcare provider about the same time each day.  DO NOT stop taking without talking to the doctor who prescribed the medication.  Stopping without other blood clot prevention medication to take the place of Coumadin may increase your risk of developing a new clot or stroke.  Get refills before you run out. ° °What do you do if you miss a dose? °If you miss a dose, take it as soon as you remember on the same day then continue your regularly scheduled regimen the next day.   Do not take two doses of Coumadin at the same time. ° °Important Safety Information °A possible side effect of Coumadin (Warfarin) is an increased risk of bleeding. You should call your healthcare provider right away if you experience any of the following: °  Bleeding from an injury or your nose that does not stop. °  Unusual colored urine (red or dark brown) or unusual colored stools (red or black). °  Unusual bruising for unknown reasons. °  A serious fall or if you hit your head (even if there is no bleeding). ° °Some foods or medicines interact with Coumadin® (warfarin) and might alter your response to warfarin. To help avoid this: °  Eat a balanced diet, maintaining a consistent amount of Vitamin K. °  Notify your provider about major diet changes you plan to make. °  Avoid alcohol or limit your intake to 1 drink for women and 2 drinks for men per day. °(1 drink is 5 oz. wine, 12 oz. beer, or 1.5 oz. liquor.) ° °Make sure that ANY health care provider who prescribes medication for you knows that you are taking Coumadin (warfarin).  Also make sure the healthcare provider who is monitoring your Coumadin knows when you have started a new medication including herbals and non-prescription products. ° °Coumadin® (Warfarin)  Major Drug Interactions  °Increased Warfarin Effect Decreased Warfarin Effect  °Alcohol (large quantities) °Antibiotics (esp. Septra/Bactrim, Flagyl, Cipro) °Amiodarone (Cordarone) °Aspirin (  ASA) °Cimetidine (Tagamet) °Megestrol (Megace) °NSAIDs (ibuprofen, naproxen, etc.) °Piroxicam (Feldene) °Propafenone (Rythmol SR) °Propranolol (Inderal) °Isoniazid (INH) °Posaconazole (Noxafil) Barbiturates (Phenobarbital) °Carbamazepine (Tegretol) °Chlordiazepoxide (Librium) °Cholestyramine (Questran) °Griseofulvin °Oral Contraceptives °Rifampin °Sucralfate (Carafate) °Vitamin K  ° °Coumadin® (Warfarin) Major Herbal Interactions  °Increased Warfarin Effect Decreased Warfarin Effect  °Garlic °Ginseng °Ginkgo  biloba Coenzyme Q10 °Green tea °St. John’s wort   ° °Coumadin® (Warfarin) FOOD Interactions  °Eat a consistent number of servings per week of foods HIGH in Vitamin K °(1 serving = ½ cup)  °Collards (cooked, or boiled & drained) °Kale (cooked, or boiled & drained) °Mustard greens (cooked, or boiled & drained) °Parsley *serving size only = ¼ cup °Spinach (cooked, or boiled & drained) °Swiss chard (cooked, or boiled & drained) °Turnip greens (cooked, or boiled & drained)  °Eat a consistent number of servings per week of foods MEDIUM-HIGH in Vitamin K °(1 serving = 1 cup)  °Asparagus (cooked, or boiled & drained) °Broccoli (cooked, boiled & drained, or raw & chopped) °Brussel sprouts (cooked, or boiled & drained) *serving size only = ½ cup °Lettuce, raw (green leaf, endive, romaine) °Spinach, raw °Turnip greens, raw & chopped  ° °These websites have more information on Coumadin (warfarin):  www.coumadin.com; °www.ahrq.gov/consumer/coumadin.htm; ° ° °

## 2014-01-01 DIAGNOSIS — F0391 Unspecified dementia with behavioral disturbance: Secondary | ICD-10-CM | POA: Diagnosis present

## 2014-01-01 DIAGNOSIS — E039 Hypothyroidism, unspecified: Secondary | ICD-10-CM | POA: Diagnosis present

## 2014-01-01 DIAGNOSIS — F03918 Unspecified dementia, unspecified severity, with other behavioral disturbance: Principal | ICD-10-CM

## 2014-01-01 LAB — CBC
HEMATOCRIT: 50.4 % — AB (ref 36.0–46.0)
Hemoglobin: 17.2 g/dL — ABNORMAL HIGH (ref 12.0–15.0)
MCH: 31.2 pg (ref 26.0–34.0)
MCHC: 34.1 g/dL (ref 30.0–36.0)
MCV: 91.3 fL (ref 78.0–100.0)
Platelets: 152 10*3/uL (ref 150–400)
RBC: 5.52 MIL/uL — ABNORMAL HIGH (ref 3.87–5.11)
RDW: 13.3 % (ref 11.5–15.5)
WBC: 4.1 10*3/uL (ref 4.0–10.5)

## 2014-01-01 LAB — BASIC METABOLIC PANEL
BUN: 13 mg/dL (ref 6–23)
CALCIUM: 10.3 mg/dL (ref 8.4–10.5)
CO2: 24 mEq/L (ref 19–32)
CREATININE: 1.03 mg/dL (ref 0.50–1.10)
Chloride: 101 mEq/L (ref 96–112)
GFR calc Af Amer: 58 mL/min — ABNORMAL LOW (ref >90)
GFR, EST NON AFRICAN AMERICAN: 50 mL/min — AB (ref >90)
GLUCOSE: 96 mg/dL (ref 70–99)
Potassium: 3.9 mEq/L (ref 3.7–5.3)
Sodium: 141 mEq/L (ref 137–147)

## 2014-01-01 LAB — PROTIME-INR
INR: 2.81 — ABNORMAL HIGH (ref 0.00–1.49)
Prothrombin Time: 28.6 seconds — ABNORMAL HIGH (ref 11.6–15.2)

## 2014-01-01 MED ORDER — LEVOTHYROXINE SODIUM 25 MCG PO TABS: 25.0000 ug | ORAL_TABLET | Freq: Every day | ORAL | Status: DC

## 2014-01-01 MED ORDER — OLANZAPINE 5 MG PO TBDP: 5.0000 mg | ORAL_TABLET | Freq: Two times a day (BID) | ORAL | Status: DC | PRN

## 2014-01-01 MED ORDER — LORAZEPAM 2 MG/ML IJ SOLN
1.0000 mg | Freq: Once | INTRAMUSCULAR | Status: DC
  Filled 2014-01-01: qty 1

## 2014-01-01 MED ORDER — DONEPEZIL HCL 5 MG PO TABS: 5.0000 mg | ORAL_TABLET | Freq: Every day | ORAL | Status: DC

## 2014-01-01 NOTE — Progress Notes (Signed)
At 1947 pt had a 2.57 second pause on the telemetry monitor.  Pt asleep and lethargic but arousable.  MD notified and RN given order to hold evening dose of Metoprolol.  Will continue to monitor.

## 2014-01-01 NOTE — Progress Notes (Addendum)
   CARE MANAGEMENT NOTE 01/01/2014  Patient:  [name],[name]   Account Number:  [id number]  Date Initiated:  01/01/2014  Documentation initiated by:  [facility]us[facility]FFRIES,Deserae Jennings  Subjective/Objective Assessment:   adm: Headache pain and slurred speech     Action/Plan:   discharge planning   Anticipated DC Date:  01/01/2014   Anticipated DC Plan:  HOME W HOME HEALTH SERVICES      DC Planning Services  CM consult      [facility]   Choice offered to / List presented to:  C-4 Adult Children   DME arranged  WALKER - ROLLING  SHOWER SEAT      DME agency  [facility]     HH arranged  HH-1 RN  HH-2 PT      [facility]  [facility]   Status of service:  Completed, signed off Medicare Important Message given?   (If response is "NO", the following Medicare IM given date fields will be blank) Date Medicare IM given:   Date Additional Medicare IM given:    Discharge Disposition:  HOME W HOME HEALTH SERVICES  Per UR Regulation:    If discussed at Long Length of Stay Meetings, dates discussed:    Comments:  01/01/14 13:00 CM spoke with pt who requested I arrange [facility] with her daughter-in-law, Agustin CreeDarlene [phone].  Pt will be going home with and recuperating at Darlene's address: [address] [place], [place] [address]. Tub bench and rolling walker will be delivered to room prior to discharge. Referral [facility]ed to [facility] rep, [place] with address and contact changes.  No other CM needs were communicated. Freddy JakschSarah Weda Baumgarner, BSN, CM [phone].

## 2014-01-01 NOTE — Progress Notes (Signed)
After 4am Neuro check and vitals, pt woke crying and yelling at nurse.  Pt reports that she believes that everyone is trying to kill her.  Pt heart rate increase into the 140s.  MD notified and ativan ordered but RN unable to give at this time d/t pt refusal and belief that RN is trying to kill her.  When left alone in the room with the door open, the pt gradually settled down and heart rate decreased to 90s.  Will continue to monitor.  Asked lab to return at a later time d/t pt's current agitation.

## 2014-01-01 NOTE — Progress Notes (Signed)
ANTICOAGULATION CONSULT NOTE - Follow Up Consult  Pharmacy Consult for coumadin Indication: atrial fibrillation  No Known Allergies  Patient Measurements: Weight: 181 lb 6 oz (82.271 kg)   Vital Signs: Temp: 97.5 F (36.4 C) (04/11 0433) Temp src: Oral (04/11 0433) BP: 118/104 mmHg (04/11 0939) Pulse Rate: 82 (04/11 0433)  Labs:  Recent Labs  12/30/13 0613 12/31/13 0559 01/01/14 1010  HGB  --  14.6 17.2*  HCT  --  44.3 50.4*  PLT  --  140* 152  LABPROT 24.7* 26.8* 28.6*  INR 2.32* 2.58* 2.81*  CREATININE 1.06 0.90 1.03    The CrCl is unknown because both a height and weight (above a minimum accepted value) are required for this calculation.   Assessment: Patient is a 80 y.o F on coumadin for Afib with plan to d/c her home today.  INR is therapeutic at 2.81.  No bleeding documented.   Goal of Therapy:  INR 2-3 Monitor platelets by anticoagulation protocol: Yes   Plan:  1) continue home regimen of 2mg  daly except 4mg  on MWF 2) will f/u with patient in the morning if she's still here  Lise Pincus P Nashae Maudlin 01/01/2014,12:45 PM

## 2014-01-01 NOTE — Progress Notes (Signed)
Occupational Therapy Treatment Patient Details Name: [name] MRN: [id number] DOB: October 31, 1932 Today's Date: 01/01/2014    History of present illness 80 [facility].o. [facility] with HA, left hip pain, and slurred speech.  Pt dx with acute encephalopathy.  CT negative for acute events.  MRI showed, "Apparent left parietal encephalomalacia which may reflect remote traumatic or ischemic event".  Cardiology following for A-fib and slow ventricular response and pauses.  Pt with signficant PMHx of HTN, diastolic HF, anemia, A-fib, L frontal CVA, CAD, PAD, and right brachial embolectomy.     OT comments  Pt declines use of RW and remains fall risk. Pt requires HHA min (A) for transfers and family expressing ability to provide. Pt and family educated on the need to sponge bath until therapy is able to practice shower transfers onto shower seat.    Follow Up Recommendations  Home health OT;Supervision/Assistance - 24 hour    Equipment Recommendations  Tub/shower seat son states tub bench will not fit in bathroom   Recommendations for Other Services      Precautions / Restrictions Precautions Precautions: Fall       Mobility Bed Mobility Overal bed mobility: Modified Independent                Transfers Overall transfer level: Needs assistance Equipment used: Rolling walker (2 wheeled) Transfers: Sit to/from Stand Sit to Stand: Min assist              Balance Overall balance assessment: Needs assistance         Standing balance support: During functional activity Standing balance-Leahy Scale: Fair Standing balance comment: min (A) sink level                   ADL Overall ADL's : Needs assistance/impaired     Grooming: Minimal assistance;Cueing for sequencing;Cueing for safety;Sitting                   Toilet Transfer: Minimal assistance;Cueing for safety;Cueing for sequencing;Regular Toilet;Grab bars (hand held (A0)   Toileting-  Clothing Manipulation and Hygiene: Minimal assistance;Cueing for safety;Cueing for sequencing;Sit to/from stand       Functional mobility during ADLs: Minimal assistance (HHA refusing RW) General ADL Comments: pt agreeable to therapy with family present to coach. Pt very minimal responses to therapist initially. pt progressed to bathroom and voiding. pt high risk for UTI due to hygiene detail. Pt declines RW use and fall risk. pt mod cueing for hand hygiene. pt returning to recliner and sitting with cueing from therapist to ambulate further. Pt perseverating with answers and very short sustain attention levels. Family reports pt is very upset about not being at home and keeps asking to return home asap.      Vision                     Perception     Praxis      Cognition   Behavior During Therapy: Flat affect Overall Cognitive Status: Impaired/Different from baseline Area of Impairment: Orientation;Attention;Memory;Following commands;Safety/judgement;Awareness;Problem solving Orientation Level: Disoriented to;Place;Time;Situation Current Attention Level: Sustained Memory: Decreased recall of precautions;Decreased short-term memory  Following Commands: Follows one step commands inconsistently Safety/Judgement: Decreased awareness of deficits;Decreased awareness of safety Awareness: Intellectual Problem Solving: Slow processing;Difficulty sequencing General Comments: pt able to state birth date ([birth year]) and with two choices able to choose correct month. 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 pt required rapport before pt responding to questions and with family present participating. pt very perseverative on name and birth date questioning. MD arriving and pt continued to answer with same responses to different questioning    Extremity/Trunk Assessment               Exercises     Shoulder Instructions       General Comments       Pertinent Vitals/ Pain       None expressed  Home Living                                          Prior Functioning/Environment              Frequency Min 2X/week     Progress Toward Goals  OT Goals(current goals can now be found in the care plan section)  Progress towards OT goals: Progressing toward goals  Acute Rehab OT Goals Patient Stated Goal: unable to state OT Goal Formulation: With family Time For Goal Achievement: 01/13/14 Potential to Achieve Goals: Good ADL Goals Pt Will Perform Lower Body Bathing: with caregiver independent in assisting;with set-up Pt Will Perform Lower Body Dressing: with set-up;with caregiver independent in assisting Pt Will Transfer to Toilet: with min guard assist;ambulating Pt Will Perform Toileting - Clothing Manipulation and hygiene: sit to/from stand;with caregiver independent in assisting;with min guard assist  Plan Discharge plan remains appropriate    Co-evaluation                 End of Session  in chair family MD and RN present    Activity Tolerance Patient tolerated treatment well   Patient Left in chair;with call bell/phone within reach;with family/visitor present;with nursing/sitter in room   Nurse Communication Mobility status;Precautions        Time: 4098-1191 OT Time Calculation (min): 15 min  Charges: OT General Charges $OT Visit: 1 Procedure OT Treatments $Self Care/Home Management : 8-22 mins  Harolyn Rutherford 01/01/2014, 3:30 PM Pager: [phone]

## 2014-01-01 NOTE — Progress Notes (Signed)
Pt D/C home with family. Home Health RN. PT, and DME aaranged wit [facility]. Rolling walker and Shower seat delivered to room.  Son Ethelene Brownsnthony present at the time of D/C. Instructions and education materials provided; questions answered.

## 2014-01-01 NOTE — Discharge Summary (Addendum)
Physician Discharge Summary  [name] Stephens DOB: [birth year] DOA: 12/28/2013  PCP: PROVIDER NOT IN SYSTEM  Admit date: 12/28/2013 Discharge date: 01/01/2014  Time spent: 35 minutes  Recommendations for Outpatient Follow-up:  1. Please follow up on blood pressures, beta blockers discontinued on discharge due to bradycardia 2. Patient with Afib with slow ventricular response. Beta Blockers held, will need follow up.  3. Repeat TSH in one month, patient having a TSH of 0.262 on 12/30/2013  Discharge Diagnoses:  Principal Problem:   Dementia with behavioral disturbance Active Problems:   Bradycardia   Hypertension   Chronic diastolic heart failure   Possible CAD - abnl myoview 2011, managed conservatively   PAD (peripheral artery disease)   Acute encephalopathy   Atrial fibrillation with slow ventricular response   Thrombocytopenia, unspecified   Leukopenia   Abnormal TSH   Hypokalemia   Discharge Condition: Stable  Diet recommendation: Heart Healthy  Filed Weights   12/28/13 1846  Weight: 82.271 kg (181 lb 6 oz)    History of present illness:  [name] is a [age] y.o. female with history of atrial fibrillation on Coumadin, history of cardioembolic events, diastolic CHF last EF measured was 60-65%, hypertension was brought to the ER after patient was found to be complaining of headache left hip pain and patient's son noticed slurred speech. The time of onset is not clear. By the time patient reached ER patient was looking confused and ER physician felt the patient had left facial droop. Patient is moving all extremities though feels weak. CT head did not show anything acute. Patient telemetry monitor shows heart rates going down to the 40s. Patient at this time has been admitted for further workup including possible CVA. Patient did not complain of any chest pain or shortness of breath did not have any nausea vomiting. Has been having some left hip pain and also had  complained of right sided headache   Hospital Course:  Patient is a pleasant 80 year old female with a past medical history of atrial fibrillation, history of cardioembolic events, diastolic congestive heart failure, admitted to the medicine service on 12/29/2013. Patient presented with mental status changes. Family members had reported a functional decline over the past several months. It appears that she has a history of cognitive impairment. Initial CT scan of brain was negative, this was followed up with an MRI of brain that did not show acute infarct, did reveal remote infarct of left parietal lobe with encephalomalacia. Patient found to be in A. fib with slow ventricular response, having pauses on telemetry. Cardiology was consulted as beta blocker was discontinued. A transthoracic echocardiogram performed on 12/29/2013 showing ejection fraction of 55-60% without wall motion abnormalities. I suspect mental status changes represent progression of underlying dementia although it is conceivable that underlying bradycardia may have precipitated a functional decline. Patient having extensive workup that included chest x-ray and urinalysis that did not show evidence of underlying infectious process. By 01/01/2014 she appear to be functioning at her baseline, and wished to go home. Given the presence of pauses beta blocker therapy was discontinued on discharge. She was given a prescription for Aricept for dementia. She was also given a prescription for Zyprexa to be taken as needed for severe agitation at home. Prior to discharge home health services were requested for home PT and home RN. Family members wanted to take patient home and reassured me that they would provide 24/7 care and supervision.   Procedures:  Transthoracic echocardiogram performed on  12/29/2013 showing EF of 55-60% without wall motion abnormalities  Consultations:  Cardiology  Discharge Exam: Filed Vitals:   01/01/14 0939  BP:  118/104  Pulse:   Temp:   Resp:     General: Patient appears more calm, cooperative, no acute distress Cardiovascular: Regular rate and rhythm normal S1-S2 Respiratory: Clear to auscultation bilaterally Abdomen: Soft nontender nondistended  Discharge Instructions You were cared for by a hospitalist during your hospital stay. If you have any questions about your discharge medications or the care you received while you were in the hospital after you are discharged, you can call the unit and asked to speak with the hospitalist on call if the hospitalist that took care of you is not available. Once you are discharged, your primary care physician will handle any further medical issues. Please note that NO REFILLS for any discharge medications will be authorized once you are discharged, as it is imperative that you return to your primary care physician (or establish a relationship with a primary care physician if you do not have one) for your aftercare needs so that they can reassess your need for medications and monitor your lab values.      Discharge Orders   Future Orders Complete By Expires   Call MD for:  difficulty breathing, headache or visual disturbances  As directed    Call MD for:  extreme fatigue  As directed    Call MD for:  persistant dizziness or light-headedness  As directed    Call MD for:  persistant nausea and vomiting  As directed    Call MD for:  severe uncontrolled pain  As directed    Call MD for:  temperature >100.4  As directed    Diet - low sodium heart healthy  As directed    Increase activity slowly  As directed        Medication List    STOP taking these medications       carvedilol 12.5 MG tablet  Commonly known as:  COREG     cloNIDine 0.3 MG tablet  Commonly known as:  CATAPRES     furosemide 40 MG tablet  Commonly known as:  LASIX      TAKE these medications       amLODipine 10 MG tablet  Commonly known as:  NORVASC  Take 10 mg by mouth daily.      atorvastatin 80 MG tablet  Commonly known as:  LIPITOR  Take 80 mg by mouth daily at 6 PM.     donepezil 5 MG tablet  Commonly known as:  ARICEPT  Take 1 tablet (5 mg total) by mouth at bedtime.     lisinopril 40 MG tablet  Commonly known as:  PRINIVIL,ZESTRIL  Take 40 mg by mouth daily.     OLANZapine zydis 5 MG disintegrating tablet  Commonly known as:  ZYPREXA ZYDIS  Take 1 tablet (5 mg total) by mouth 2 (two) times daily as needed (For agitation).     warfarin 4 MG tablet  Commonly known as:  COUMADIN  Take 2-4 mg by mouth every evening. Takes 2mg  on sun, tues, thurs, and sat. 4mg  on m,w,f       No Known Allergies Follow-up Information   Follow up with [facility] In 1 week.   Contact information:   [address] [place] [address] [phone]       The results of significant diagnostics from this hospitalization (including imaging,  microbiology, ancillary and laboratory) are listed below for reference.    Significant Diagnostic Studies: Dg Chest 2 View  12/28/2013   CLINICAL DATA:  Altered mental status.  EXAM: CHEST  2 VIEW  COMPARISON:  PA and lateral chest 01/30/2013.  FINDINGS: Asymmetric elevation of the left hemidiaphragm is unchanged. Lungs are clear. There is cardiomegaly.  IMPRESSION: Cardiomegaly without acute disease.  Stable compared to prior exam.   Electronically Signed   By: Drusilla Kanner M.D.   On: 12/28/2013 21:20   Ct Head Wo Contrast  12/28/2013   CLINICAL DATA:  Headache.  Altered mental status.  EXAM: CT HEAD WITHOUT CONTRAST  TECHNIQUE: Contiguous axial images were obtained from the base of the skull through the vertex without intravenous contrast.  COMPARISON:  05/07/2006  FINDINGS: No mass lesion. No midline shift. No acute hemorrhage or hematoma. No extra-axial fluid collections. No evidence of acute infarction. There are old white matter infarcts in the left frontal and parietal white matter. No  ventricular dilatation. No acute osseous abnormality. Diffuse thickening of the calvarium is stable.  IMPRESSION: No acute intracranial abnormality.  Old white matter infarcts.   Electronically Signed   By: Geanie Cooley M.D.   On: 12/28/2013 21:46   Mr Brain Wo Contrast  12/31/2013   CLINICAL DATA:  Dementia. Acute encephalopathy. Etiology indeterminate.  EXAM: MRI HEAD WITHOUT CONTRAST  TECHNIQUE: Multiplanar, multiecho pulse sequences of the brain and surrounding structures were obtained without intravenous contrast.  COMPARISON:  12/29/2013 MR. 12/28/2013 CT.  FINDINGS: Exam is motion degraded.  No acute infarct.  Remote infarct left parietal lobe with encephalomalacia.  Small vessel disease type changes.  No intracranial hemorrhage.  No intracranial mass lesion noted on this unenhanced exam.  No hydrocephalus.  Opacification mastoid air cells bilaterally. Opacification left middle ear cavity. No obstructing lesion noted at the level of the posterior superior nasopharynx causing eustachian tube dysfunction.  Minimal to mild paranasal sinus mucosal thickening.  Hyperostosis frontalis interna.  Cervical spondylotic changes with spinal stenosis and mild cord flattening C3-4 and spinal stenosis C4-5.  Transverse ligament hypertrophy. Cervical medullary junction unremarkable.  Pituitary region, pineal region and orbital structures unremarkable.  IMPRESSION: Exam is motion degraded.  No acute infarct.  Remote infarct left parietal lobe with encephalomalacia.  Small vessel disease type changes.  Opacification mastoid air cells bilaterally. Opacification left middle ear cavity.  Minimal to mild paranasal sinus mucosal thickening.  Hyperostosis frontalis interna.  Cervical spondylotic changes with spinal stenosis and mild cord flattening C3-4 and spinal stenosis C4-5.   Electronically Signed   By: Bridgett Larsson M.D.   On: 12/31/2013 12:25   Mr Brain Wo Contrast  12/30/2013   CLINICAL DATA:  Stroke.  EXAM: MRI HEAD  WITHOUT CONTRAST  TECHNIQUE: Multiplanar, diffusion weighted sequences of the brain and surrounding structures were obtained without intravenous contrast.  COMPARISON:  CT HEAD W/O CM dated 12/28/2013  FINDINGS: Per technologist, patient was unable to tolerate further imaging and the examination was prematurely terminated.  No reduced diffusion to suggest acute ischemia. No midline shift or mass effect. Left apparent parietal encephalomalacia. No midline shift or mass effect. The ventricles and sulci appear overall normal for patient's age.  IMPRESSION: Limited MRI of the brain, consisting of diffusion-weighted sequences. Should patient return for exam completion, an addendum will be made to this report.  No acute ischemia.  Apparent left parietal encephalomalacia which may reflect remote traumatic or ischemic event.   Electronically Signed   By: Pernell Dupre  Bloomer   On: 12/30/2013 05:26   Dg Pelvis Portable  12/29/2013   CLINICAL DATA:  Hip pain.  EXAM: PORTABLE PELVIS 1-2 VIEWS  COMPARISON:  Abdominal radiographs 07/28/2008 and pelvic CT 08/22/2005  FINDINGS: There is no evidence of acute fracture. The femoral heads are approximated with the acetabula on these AP projections. There is no evidence of pelvic diastases. Mild degenerative changes are present at the pubic symphysis. Mild bilateral hip joint space narrowing is present, unchanged. Soft tissue calcification projecting lateral to the right hip joint is unchanged. Degenerative disc disease is partially visualized in the lower lumbar spine. A calcification projects in the left lower quadrant, likely unchanged from prior CT.  IMPRESSION: No evidence of acute osseous abnormality. Mild bilateral hip joint osteoarthrosis.   Electronically Signed   By: Sebastian Ache   On: 12/29/2013 08:07    Microbiology: No results found for this or any previous visit (from the past 240 hour(s)).   Labs: Basic Metabolic Panel:  Recent Labs Lab 12/28/13 2035  12/29/13 0444 12/30/13 0613 12/31/13 0559  NA 141 144 142 143  K 3.9 3.3* 3.9 3.8  CL 102 105 102 104  CO2 22 27 24 24   GLUCOSE 119* 114* 87 78  BUN 23 19 16 12   CREATININE 1.21* 1.15* 1.06 0.90  CALCIUM 9.4 9.2 9.8 9.5  MG  --  2.2  --   --    Liver Function Tests:  Recent Labs Lab 12/28/13 2035 12/29/13 0444  AST 23 17  ALT 14 11  ALKPHOS 91 82  BILITOT 0.7 0.6  PROT 7.4 6.8  ALBUMIN 3.3* 3.0*   No results found for this basename: LIPASE, AMYLASE,  in the last 168 hours  Recent Labs Lab 12/29/13 0444  AMMONIA 35   CBC:  Recent Labs Lab 12/28/13 2035 12/29/13 0444 12/31/13 0559  WBC 3.5* 2.9* 3.9*  NEUTROABS 1.3* 1.0*  --   HGB 12.7 12.0 14.6  HCT 38.6 37.0 44.3  MCV 92.6 92.3 91.0  PLT 117* 117* 140*   Cardiac Enzymes:  Recent Labs Lab 12/28/13 2035 12/29/13 0444  TROPONINI <0.30 <0.30   BNP: BNP (last 3 results)  Recent Labs  01/30/13 0258 08/24/13 1254  PROBNP 588.4* 140.0*   CBG: No results found for this basename: GLUCAP,  in the last 168 hours     Signed:  Jeralyn Bennett  [facility] 01/01/2014, 10:41 AM

## 2014-01-10 ENCOUNTER — Ambulatory Visit: Payer: Medicare Other | Attending: Internal Medicine | Admitting: Internal Medicine

## 2014-01-10 ENCOUNTER — Encounter: Payer: Self-pay | Admitting: Internal Medicine

## 2014-01-10 VITALS — BP 138/88 | HR 80 | Temp 98.1°F | Resp 16 | Ht 63.0 in | Wt 181.0 lb

## 2014-01-10 DIAGNOSIS — R946 Abnormal results of thyroid function studies: Secondary | ICD-10-CM

## 2014-01-10 DIAGNOSIS — Z8673 Personal history of transient ischemic attack (TIA), and cerebral infarction without residual deficits: Secondary | ICD-10-CM | POA: Insufficient documentation

## 2014-01-10 DIAGNOSIS — I10 Essential (primary) hypertension: Secondary | ICD-10-CM | POA: Insufficient documentation

## 2014-01-10 DIAGNOSIS — Z7689 Persons encountering health services in other specified circumstances: Secondary | ICD-10-CM

## 2014-01-10 DIAGNOSIS — E785 Hyperlipidemia, unspecified: Secondary | ICD-10-CM | POA: Insufficient documentation

## 2014-01-10 DIAGNOSIS — F03918 Unspecified dementia, unspecified severity, with other behavioral disturbance: Secondary | ICD-10-CM

## 2014-01-10 DIAGNOSIS — R7989 Other specified abnormal findings of blood chemistry: Secondary | ICD-10-CM

## 2014-01-10 DIAGNOSIS — Z008 Encounter for other general examination: Secondary | ICD-10-CM | POA: Insufficient documentation

## 2014-01-10 DIAGNOSIS — I2510 Atherosclerotic heart disease of native coronary artery without angina pectoris: Secondary | ICD-10-CM | POA: Insufficient documentation

## 2014-01-10 DIAGNOSIS — I5030 Unspecified diastolic (congestive) heart failure: Secondary | ICD-10-CM | POA: Insufficient documentation

## 2014-01-10 DIAGNOSIS — D509 Iron deficiency anemia, unspecified: Secondary | ICD-10-CM | POA: Insufficient documentation

## 2014-01-10 DIAGNOSIS — F0390 Unspecified dementia without behavioral disturbance: Secondary | ICD-10-CM | POA: Insufficient documentation

## 2014-01-10 DIAGNOSIS — F0391 Unspecified dementia with behavioral disturbance: Secondary | ICD-10-CM

## 2014-01-10 DIAGNOSIS — I4891 Unspecified atrial fibrillation: Secondary | ICD-10-CM | POA: Insufficient documentation

## 2014-01-10 DIAGNOSIS — Z7189 Other specified counseling: Secondary | ICD-10-CM

## 2014-01-10 LAB — POCT URINALYSIS DIPSTICK
Glucose, UA: NEGATIVE
Leukocytes, UA: NEGATIVE
Nitrite, UA: NEGATIVE
PH UA: 6
Protein, UA: 100
Urobilinogen, UA: 0.2

## 2014-01-10 LAB — POCT INR: INR: 1.7

## 2014-01-10 MED ORDER — WARFARIN SODIUM 4 MG PO TABS: 2.0000 mg | ORAL_TABLET | Freq: Every evening | ORAL | Status: DC

## 2014-01-10 NOTE — Progress Notes (Signed)
Bonnie MaineGrandson brings pt in to establish care for recent diagnosis Atrial Fib with sinus Huston FoleyBrady. [facility] Pt diagnosed with Dementia and prescribed Aricept 5 mg tab Pt alert to self only  Pt lives alone with son helping with medication management Denies CP or SOB at this time Will schedule appt with Ty[facility]

## 2014-01-10 NOTE — Progress Notes (Signed)
Patient ID: [name], female   DOB: 1932/11/01, 80 y.o.   MRN: [id number]    [name], is a 80 y.o. female  WUJ:[id number]  NFA:[id number]:[id number]  DOB - 1932/11/01  CC:  Chief Complaint  Patient presents with  . Establish Care  . Atrial Fibrillation  . Dementia       HPI: [name] is a 80 y.o. female here today as a hospital follow up.  She presents today with her grandson and his wife.  She has a past medical history of dementia, heart failure, A. Fib, CVA, CAD, and PAD.  Patient has altered mental status and is only oriented to self.  Family reports this has been her baseline for the past month.  She was recently discharged from the hospital for atrial fibrillation with a slow ventricular rhythm and altered mental status.  Family states the patient has become aggressive and wonders off when she gets angry.  Family states patient took off walking last night and they had to go find her after receiving a call from her son who she lives with.  The grandson states that the patient's son no longer wants to care for her because it is too much for him to handle due to his limited mobility and health issues.  The family reports that the patient has not had her INR level checked in months before going to the hospital due to communication concerns with previous PCP.  Patients grandson reports that he has tried to get his grandmother to live with him but she will not stay at his house. He states he can bring her to appointments to get her INR checked until he finds a job which will then limit the days he can transport her to doctor visits.  He denies any falls but does report the patient needing increasing amount of continuous care d/t decline in functional status.  At this time she does have [facility] who comes out twice weekly to check her BP.  Family states that they set her medication up in a pill container but she takes her medication daily by herself.  Patient confused while interviewing  and could not answer multiple questions about review of systems. Patient denies headache, chest pain, abdominal pain, cough, SOB.  No Known Allergies Past Medical History  Diagnosis Date  . HTN (hypertension)     a. Resistant HTN. Patient states that this has been poorly controlled for years.   . Diastolic HF (heart failure)     Echo (7/11): EF 60-65%, moderate LVH, severe diastolic dysfunction.   . Iron deficiency anemia   . Chronic atrial fibrillation     a. Chronic. Has had cardioembolic right renal infarct in 12/06 and cardioembolic event to the right arm requiring right brachial embolectomy. Both events occurred while subtherapeutic on coumadin. b. Slow VR 12/2013.  . Obese   . Cerebrovascular accident, embolic     left frontal  . Dyslipidemia   . CAD (coronary artery disease)     a. Possible dx -  Lexiscan myoview (9/11): EF 76%, normal wall motion, possible small anterior MI with mild peri-infarct ischemia but cannot rule out breast attenuation, managed conservatively.  Marland Kitchen. PAD (peripheral artery disease)     a. peripheral arterial dopplers (5/12) with right mid popliteal artery occlusion with reconstitution of arteries below the knees by collaterals. Only mild reduction in ABI on the right (not tissue-threatening).   Current Outpatient Prescriptions on File Prior to Visit  Medication  Sig Dispense Refill  . amLODipine (NORVASC) 10 MG tablet Take 10 mg by mouth daily.      Marland Kitchen atorvastatin (LIPITOR) 80 MG tablet Take 80 mg by mouth daily at 6 PM.       . donepezil (ARICEPT) 5 MG tablet Take 1 tablet (5 mg total) by mouth at bedtime.  30 tablet  1  . OLANZapine zydis (ZYPREXA ZYDIS) 5 MG disintegrating tablet Take 1 tablet (5 mg total) by mouth 2 (two) times daily as needed (For agitation).  30 tablet  1  . lisinopril (PRINIVIL,ZESTRIL) 40 MG tablet Take 40 mg by mouth daily.       No current facility-administered medications on file prior to visit.   Family History  Problem Relation  Age of Onset  . Hypertension      multipe relatives with Resistant HTN.   History   Social History  . Marital Status: Widowed    Spouse Name: N/A    Number of Children: 3  . Years of Education: N/A   Occupational History  . Not on file.   Social History Main Topics  . Smoking status: Never Smoker   . Smokeless tobacco: Not on file  . Alcohol Use: No  . Drug Use: Not on file  . Sexual Activity: Not on file   Other Topics Concern  . Not on file   Social History Narrative  . No narrative on file    Review of Systems: Constitutional: Negative for fever, diaphoresis, activity change, and fatigue. HENT: Negative for ear pain, nosebleeds, congestion, facial swelling, rhinorrhea, neck pain, neck stiffness and ear discharge.  Eyes: Negative for pain, discharge, redness, itching and visual disturbance. Patient states problem with eyes, cannot get specifics d/t to mental status Respiratory: Negative for cough, choking, chest tightness, shortness of breath, wheezing and stridor.  Cardiovascular: Negative for chest pain, palpitations. Gastrointestinal: Could not assess. Genitourinary: Could not assess Musculoskeletal: Negative for back pain, joint swelling, arthralgia and gait problem. Neurological: Negative for tremors, seizures, facial asymmetry, speech difficulty, light-headedness, numbness and headaches--Per family and patient Hematological:  Does not bruise/bleed easily.---Per family   Objective:   Filed Vitals:   01/10/14 1018  BP: 138/88  Pulse: 80  Temp: 98.1 F (36.7 C)  Resp: 16    Physical Exam: Constitutional: Patient appears well nourished, unclean with old urine on clothes HENT: Normocephalic, atraumatic, External right and left ear normal. Oropharynx is clear and moist.  Eyes: Conjunctivae normal. PERRLA, no scleral icterus. Neck: Normal ROM. Neck supple. No JVD. No tracheal deviation. No thyromegaly. CVS: RRR, S1/S2 +, no murmurs, no gallops, no carotid  bruit.  Pulmonary: Effort and breath sounds normal, no stridor, rhonchi, wheezes, rales.  Abdominal: Soft. BS +, no distension, tenderness, rebound or guarding.  Musculoskeletal: Normal range of motion. Non pitting edema of bilateral ankles.  Lymphadenopathy: No lymphadenopathy noted, cervical Neuro: Alert. Oriented only to self Skin: Skin is warm and dry. No rash noted. Not diaphoretic. No erythema. No pallor. Psychiatric: Patient confused but pleasant   Lab Results  Component Value Date   WBC 4.1 01/01/2014   HGB 17.2* 01/01/2014   HCT 50.4* 01/01/2014   MCV 91.3 01/01/2014   PLT 152 01/01/2014   Lab Results  Component Value Date   CREATININE 1.03 01/01/2014   BUN 13 01/01/2014   NA 141 01/01/2014   K 3.9 01/01/2014   CL 101 01/01/2014   CO2 24 01/01/2014    Lab Results  Component Value Date  HGBA1C 5.4 12/29/2013   Lipid Panel     Component Value Date/Time   CHOL 95 12/29/2013 0444   TRIG 36 12/29/2013 0444   HDL 37* 12/29/2013 0444   CHOLHDL 2.6 12/29/2013 0444   VLDL 7 12/29/2013 0444   LDLCALC 51 12/29/2013 0444       Assessment and plan:   1. Atrial fibrillation - INR - warfarin (COUMADIN) 4 MG tablet; Take 0.5-1 tablets (2-4 mg total) by mouth every evening. Takes 2mg  on sun, tues, thurs, and sat. 4mg  on m,w,f  Dispense: 60 tablet; Refill: 1 Take 6 mg of Coumadin today and then continue with regular schedule tomorrow. Will repeat INR in 1 week  2. Encounter to establish care  - Urinalysis Dipstick  3. Dementia with behavioral disturbance  - Ambulatory referral to Home Health - [facility]------[facility] and patient already has PT, RN, and social work seeing her.  Concerned grandson is not telling the full extent of safety concerns.   4. Abnormal TSH - TSH; Future---repeat at 1 month visit with PCP  5. Other and unspecified hyperlipidemia  - Lipid Panel; Future can do at 1 month f/u with PCP   Return in about 4 weeks  (around 02/07/2014) for f/u on Dementia F/u with [facility] in 1 week for INR/coumadin   Family given extensive counseling on the need for continuous care and medication management. Counseled family on importance of f/u with PCP for INR levels and dangers of coumadin if not properly managed. Family education provided about Dementia and expected behaviors.  Family told to make sure patient is hydrated and will recheck urine dipstick at next visit.    Holland CommonsValerie Dailey Buccheri, NP-C [facility] [phone] 01/10/2014, 12:22 PM

## 2014-01-10 NOTE — Patient Instructions (Signed)
Dementia Dementia is a general term for problems with brain function. A person with dementia has memory loss and a hard time with at least one other brain function such as thinking, speaking, or problem solving. Dementia can affect social functioning, how you do your job, your mood, or your personality. The changes may be hidden for a long time. The earliest forms of this disease are usually not detected by family or friends. Dementia can be: Irreversible. Potentially reversible. Partially reversible. Progressive. This means it can get worse over time. CAUSES  Irreversible dementia causes may include: Degeneration of brain cells (Alzheimer's disease or lewy body dementia). Multiple small strokes (vascular dementia). Infection (chronic meningitis or Creutzfelt-Jakob disease). Frontotemporal dementia. This affects younger people, age 53 to 55, compared to those who have Alzheimer's disease. Dementia associated with other disorders like Parkinson's disease, Huntington's disease, or HIV-associated dementia. Potentially or partially reversible dementia causes may include: Medicines. Metabolic causes such as excessive alcohol intake, vitamin B12 deficiency, or thyroid disease. Masses or pressure in the brain such as a tumor, blood clot, or hydrocephalus. SYMPTOMS  Symptoms are often hard to detect. Family members or coworkers may not notice them early in the disease process. Different people with dementia may have different symptoms. Symptoms can include: A hard time with memory, especially recent memory. Long-term memory may not be impaired. Asking the same question multiple times or forgetting something someone just said. A hard time speaking your thoughts or finding certain words. A hard time solving problems or performing familiar tasks (such as how to use a telephone). Sudden changes in mood. Changes in personality, especially increasing moodiness or mistrust. Depression. A hard time  understanding complex ideas that were never a problem in the past. DIAGNOSIS  There are no specific tests for dementia.  Your caregiver may recommend a thorough evaluation. This is because some forms of dementia can be reversible. The evaluation will likely include a physical exam and getting a detailed history from you and a family member. The history often gives the best clues and suggestions for a diagnosis. Memory testing may be done. A detailed brain function evaluation called neuropsychologic testing may be helpful. Lab tests and brain imaging (such as a CT scan or MRI scan) are sometimes important. Sometimes observation and re-evaluation over time is very helpful. TREATMENT  Treatment depends on the cause.  If the problem is a vitamin deficiency, it may be helped or cured with supplements. For dementias such as Alzheimer's disease, medicines are available to stabilize or slow the course of the disease. There are no cures for this type of dementia. Your caregiver can help direct you to groups, organizations, and other caregivers to help with decisions in the care of you or your loved one. HOME CARE INSTRUCTIONS The care of individuals with dementia is varied and dependent upon the progression of the dementia. The following suggestions are intended for the person living with, or caring for, the person with dementia. Create a safe environment. Remove the locks on bathroom doors to prevent the person from accidentally locking himself or herself in. Use childproof latches on kitchen cabinets and any place where cleaning supplies, chemicals, or alcohol are kept. Use childproof covers in unused electrical outlets. Install childproof devices to keep doors and windows secured. Remove stove knobs or install safety knobs and an automatic shut-off on the stove. Lower the temperature on water heaters. Label medicines and keep them locked up. Secure knives, lighters, matches, power tools, and guns,  and keep these  items out of reach. Keep the house free from clutter. Remove rugs or anything that might contribute to a fall. Remove objects that might break and hurt the person. Make sure lighting is good, both inside and outside. Install grab rails as needed. Use a monitoring device to alert you to falls or other needs for help. Reduce confusion. Keep familiar objects and people around. Use night lights or dim lights at night. Label items or areas. Use reminders, notes, or directions for daily activities or tasks. Keep a simple, consistent routine for waking, meals, bathing, dressing, and bedtime. Create a calm, quiet environment. Place large clocks and calendars prominently. Display emergency numbers and home address near all telephones. Use cues to establish different times of the day. An example is to open curtains to let the natural light in during the day.  Use effective communication. Choose simple words and short sentences. Use a gentle, calm tone of voice. Be careful not to interrupt. If the person is struggling to find a word or communicate a thought, try to provide the word or thought. Ask one question at a time. Allow the person ample time to answer questions. Repeat the question again if the person does not respond. Reduce nighttime restlessness. Provide a comfortable bed. Have a consistent nighttime routine. Ensure a regular walking or physical activity schedule. Involve the person in daily activities as much as possible. Limit napping during the day. Limit caffeine. Attend social events that stimulate rather than overwhelm the senses. Encourage good nutrition and hydration. Reduce distractions during meal times and snacks. Avoid foods that are too hot or too cold. Monitor chewing and swallowing ability. Continue with routine vision, hearing, dental, and medical screenings. Only give over-the-counter or prescription medicines as directed by the caregiver. Monitor  driving abilities. Do not allow the person to drive when safe driving is no longer possible. Register with an identification program which could provide location assistance in the event of a missing person situation. SEEK MEDICAL CARE IF:  New behavioral problems start such as moodiness, aggressiveness, or seeing things that are not there (hallucinations). Any new problem with brain function happens. This includes problems with balance, speech, or falling a lot. Problems with swallowing develop. Any symptoms of other illness happen. Small changes or worsening in any aspect of brain function can be a sign that the illness is getting worse. It can also be a sign of another medical illness such as infection. Seeing a caregiver right away is important. SEEK IMMEDIATE MEDICAL CARE IF:  A fever develops. New or worsened confusion develops. New or worsened sleepiness develops. Staying awake becomes hard to do. Document Released: 03/05/2001 Document Revised: 12/02/2011 Document Reviewed: 02/04/2011 [facility] Patient Information 2014 [place], [place]. Warfarin Coagulopathy Warfarin (Coumadin) coagulopathy refers to bleeding that may occur as a complication of the medicine warfarin. Warfarin is an oral blood thinner (anticoagulant). Warfarin is used for medical conditions where thinning of the blood is needed to prevent blood clots.  CAUSES Bleeding is the most common and most serious complication of warfarin. The amount of bleeding is related to the warfarin dose and length of treatment. In addition, bleeding complications can also occur due to:  Intentional or accidental warfarin overdose.  Underlying medical conditions.  Dietary changes.  Medicine, herbal, supplement, or alcohol interactions. SYMPTOMS Severe bleeding while on warfarin may occur from any tissue or organ. Symptoms of the blood being too thin may include:  Bleeding from the nose or gums.  Blood in bowel movements which may  appear as bright red, dark, or black tarry stools.  Blood in the urine which may appear as pink, red, or brown urine.  Unusual bruising or bruising easily.  A cut that does not stop bleeding within 10 minutes.  Vomiting blood or continuous nausea for more than 1 day.  Coughing up blood.  Broken blood vessels in your eye (subconjunctival hemorrhage).  Abdominal or back pain with or without flank bruising.  Sudden, severe headache.  Sudden weakness or numbness of the face, arm, or leg, especially on one side of the body.  Sudden confusion.  Trouble speaking (aphasia) or understanding.  Sudden trouble seeing in one or both eyes.  Sudden trouble walking.  Dizziness.  Loss of balance or coordination.  Vaginal bleeding.  Swelling or pain at an injection site.  Superficial fat tissue death (necrosis) which may cause skin scarring. This is more common in women and may first present as pain in the waist, thighs, and buttocks.  Fever. HOME CARE INSTRUCTIONS  Always contact your caregiver of any concerns or signs of possible warfarin coagulopathy as soon as possible.  Take warfarin exactly as directed by your caregiver. It is recommended that you take your warfarin dose at the same time of the day. It is preferred that you take warfarin in the late afternoon. If you have been told to stop taking warfarin, do not resume taking warfarin until directed to do so by your caregiver. Follow your caregiver's instructions if you accidentally take an extra dose or miss a dose of warfarin. It is very important to take warfarin as directed since bleeding or blood clots could result in chronic or permanent injury, pain, or disability.  Keep all follow-up appointments with your caregiver as directed. It is very important to keep your appointments. Not keeping appointments could result in a chronic or permanent injury, pain, or disability because warfarin is a medicine that requires close  monitoring.  While taking warfarin, you will need to have regular blood tests to measure your blood clotting time. These blood tests usually include both the prothrombin time (PT) and International Normalized Ratio (INR) tests. The PT and INR results allow your caregiver to adjust your dose of warfarin. The dose can change for many reasons. It is critically important that you have your PT and INR levels drawn exactly as directed. PT and INR lab draws are usually done in the morning. Your warfarin dose may stay the same or change depending on what the PT and INR results are. Be sure to follow up with your caregiver regarding your PT and INR test results and what your warfarin dosage should be.  Many medicines can interfere with warfarin and affect the PT and INR results. You must tell your caregiver about any and all medicines you take, this includes all vitamins and supplements. Ask your caregiver before taking these. Prescription and over-the-counter medicine consistency is critical to warfarin management. It is important that potential interactions are checked before you start a new medicine. Be especially cautious with aspirin and anti-inflammatory medicines. Ask your caregiver before taking these. Medicines such as antibiotics and acid-reducing medicine can interact with warfarin and can cause an increased warfarin effect. Warfarin can also interfere with the effectiveness of medicines you are taking. Do not take or discontinue any prescribed or over-the-counter medicine except on the advice of your caregiver or pharmacist.  Some vitamins, supplements, and herbal products interfere with the effectiveness of warfarin. Vitamin E may increase the anticoagulant effects of warfarin. Vitamin  K may can cause warfarin to be less effective. Do not take or discontinue any vitamin, supplement, or herbal product except on the advice of your caregiver or pharmacist.  Some foods, especially foods high in vitamin K can  interfere with the effectiveness of warfarin and affect the PT and INR results. A diet too high in vitamin K can cause warfarin to be less effective. A diet too low in foods containing vitamin K may lead to an excessive warfarin effect. Foods high in vitamin K include spinach, kale, broccoli, cabbage, collard and turnip greens, brussels sprouts, peas, cauliflower, seaweed, and parsley as well as beef and pork liver, green tea, and soybean oil. Eat what you normally eat and keep the vitamin K content of your diet consistent. Avoid major changes in your diet, or notify your caregiver before changing your diet. Arrange a visit with a dietitian to answer your questions.  If you have a loss of appetite or get the stomach flu (viral gastroenteritis), talk to your caregiver as soon as possible. A decrease in your normal vitamin K intake can make you more sensitive to your usual dose of warfarin.  Some medical conditions may increase your risk for bleeding while you are taking warfarin. A fever, diarrhea lasting more than a day, worsening heart failure, or worsening liver function are some medical conditions that could affect warfarin. Contact your caregiver if you have any of these medical conditions.  Be careful not to cut yourself when using sharp objects or while shaving.  Alcohol can change the body's ability to handle warfarin. It is best to avoid alcoholic drinks or consume only very small amounts while taking warfarin. Notify your caregiver if you change your alcohol intake. A sudden increase in alcohol use can increase your risk of bleeding. Chronic alcohol use can cause warfarin to be less effective.  Limit physical activities or sports that could result in a fall or cause injury.  Do not use warfarin if you are pregnant.  Inform all your caregivers and your dentist that you take warfarin.  Inform all caregivers if you are taking warfarin and aspirin or platelet inhibitor medicines such as  clopidogrel, ticagrelor, or prasugrel. Use of these medicines in conjunction with warfarin can increase your risk of bleeding or death. Taking these medicines together should only be done under the direct care of your caregiver. SEEK IMMEDIATE MEDICAL CARE IF:  You cough up blood.  You have dark or black stools or there is bright red blood coming from your rectum.  You vomit blood or have nausea for more than 1 day.  You have blood in the urine or pink colored urine.  You have unusual bruising or have increased bruising.  You have bleeding from the nose or gums that does not stop quickly.  You have a cut that does not stop bleeding within a 2 3 minutes.  You have sudden weakness or numbness of the face, arm, or leg, especially on one side of the body.  You have sudden confusion.  You have trouble speaking (aphasia) or understanding.  You have sudden trouble seeing in one or both eyes.  You have sudden trouble walking.  You have dizziness.  You have a loss of balance or coordination.  You have a sudden, severe headache.  You have a serious fall or head injury, even if you are not bleeding.  You have swelling or pain at an injection site.  You have unexplained tenderness or pain in the abdomen, back,  waist, thighs or buttocks.  You have a fever. Any of these symptoms may represent a serious problem that is an emergency. Do not wait to see if the symptoms will go away. Get medical help right away. Call your local emergency services (911 in [place] Do not drive yourself to the hospital. Document Released: 08/18/2006 Document Revised: 03/10/2012 Document Reviewed: 02/18/2012 [facility] Patient Information 2014 [place], [place].

## 2014-01-20 ENCOUNTER — Ambulatory Visit: Payer: Medicare Other | Attending: Internal Medicine | Admitting: Pharmacist

## 2014-01-20 DIAGNOSIS — Z5181 Encounter for therapeutic drug level monitoring: Secondary | ICD-10-CM | POA: Insufficient documentation

## 2014-01-20 DIAGNOSIS — Z7901 Long term (current) use of anticoagulants: Secondary | ICD-10-CM | POA: Insufficient documentation

## 2014-01-20 DIAGNOSIS — I4891 Unspecified atrial fibrillation: Secondary | ICD-10-CM

## 2014-01-20 LAB — POCT INR: INR: 2

## 2014-02-09 ENCOUNTER — Ambulatory Visit: Payer: Medicare Other | Attending: Internal Medicine | Admitting: Pharmacist

## 2014-02-09 DIAGNOSIS — Z7901 Long term (current) use of anticoagulants: Secondary | ICD-10-CM | POA: Diagnosis not present

## 2014-02-09 DIAGNOSIS — I4891 Unspecified atrial fibrillation: Secondary | ICD-10-CM | POA: Insufficient documentation

## 2014-02-09 DIAGNOSIS — Z5181 Encounter for therapeutic drug level monitoring: Secondary | ICD-10-CM

## 2014-02-09 LAB — POCT INR: INR: 1.4

## 2014-02-16 ENCOUNTER — Encounter: Payer: Medicare Other | Admitting: Pharmacist

## 2014-03-07 ENCOUNTER — Other Ambulatory Visit: Payer: Medicare Other

## 2014-03-31 ENCOUNTER — Other Ambulatory Visit: Payer: Medicare Other

## 2014-04-01 ENCOUNTER — Ambulatory Visit: Payer: Medicare Other | Attending: Internal Medicine

## 2014-04-01 DIAGNOSIS — I4891 Unspecified atrial fibrillation: Secondary | ICD-10-CM

## 2014-04-01 DIAGNOSIS — Z7901 Long term (current) use of anticoagulants: Secondary | ICD-10-CM

## 2014-04-01 DIAGNOSIS — E785 Hyperlipidemia, unspecified: Secondary | ICD-10-CM

## 2014-04-01 DIAGNOSIS — F0391 Unspecified dementia with behavioral disturbance: Secondary | ICD-10-CM

## 2014-04-01 DIAGNOSIS — F03918 Unspecified dementia, unspecified severity, with other behavioral disturbance: Secondary | ICD-10-CM

## 2014-04-01 DIAGNOSIS — Z5181 Encounter for therapeutic drug level monitoring: Secondary | ICD-10-CM

## 2014-04-01 LAB — POCT INR
INR: 1.5
INR: 1.5

## 2014-04-01 MED ORDER — ATORVASTATIN CALCIUM 80 MG PO TABS: 80.0000 mg | ORAL_TABLET | Freq: Every day | ORAL | Status: DC

## 2014-04-01 MED ORDER — DONEPEZIL HCL 5 MG PO TABS: 5.0000 mg | ORAL_TABLET | Freq: Every day | ORAL | Status: DC

## 2014-04-01 NOTE — Patient Instructions (Signed)
Your INR today was 1.5. Starting today 04/01/2014 you will take 4mg . On Saturday and Sunday you will take 4 mg also. On Monday you will take 2mg . Tuesday you will take 4 mg. Wednesday you will take 2 mg. On Thursday take 4 mg. Return on 04/07/2014 for another INR.

## 2014-04-07 ENCOUNTER — Other Ambulatory Visit: Payer: Medicare Other

## 2014-04-07 ENCOUNTER — Ambulatory Visit: Payer: Medicare Other | Attending: Internal Medicine

## 2014-04-07 DIAGNOSIS — I4891 Unspecified atrial fibrillation: Secondary | ICD-10-CM

## 2014-04-07 DIAGNOSIS — Z7901 Long term (current) use of anticoagulants: Secondary | ICD-10-CM

## 2014-04-07 DIAGNOSIS — Z5181 Encounter for therapeutic drug level monitoring: Secondary | ICD-10-CM

## 2014-04-07 LAB — POCT INR
INR: 2
INR: 2

## 2014-05-02 ENCOUNTER — Emergency Department (HOSPITAL_COMMUNITY)
Admission: EM | Admit: 2014-05-02 | Discharge: 2014-05-02 | Disposition: A | Discharge status: Home / Self Care | Payer: Medicare Other | Attending: Emergency Medicine | Admitting: Emergency Medicine

## 2014-05-02 ENCOUNTER — Encounter (HOSPITAL_COMMUNITY): Payer: Self-pay | Admitting: Emergency Medicine

## 2014-05-02 DIAGNOSIS — I10 Essential (primary) hypertension: Secondary | ICD-10-CM | POA: Diagnosis not present

## 2014-05-02 DIAGNOSIS — E669 Obesity, unspecified: Secondary | ICD-10-CM | POA: Diagnosis not present

## 2014-05-02 DIAGNOSIS — Z7901 Long term (current) use of anticoagulants: Secondary | ICD-10-CM | POA: Insufficient documentation

## 2014-05-02 DIAGNOSIS — Z862 Personal history of diseases of the blood and blood-forming organs and certain disorders involving the immune mechanism: Secondary | ICD-10-CM | POA: Insufficient documentation

## 2014-05-02 DIAGNOSIS — I4891 Unspecified atrial fibrillation: Secondary | ICD-10-CM | POA: Diagnosis not present

## 2014-05-02 DIAGNOSIS — Z8673 Personal history of transient ischemic attack (TIA), and cerebral infarction without residual deficits: Secondary | ICD-10-CM | POA: Diagnosis not present

## 2014-05-02 DIAGNOSIS — I2510 Atherosclerotic heart disease of native coronary artery without angina pectoris: Secondary | ICD-10-CM | POA: Insufficient documentation

## 2014-05-02 DIAGNOSIS — I5030 Unspecified diastolic (congestive) heart failure: Secondary | ICD-10-CM | POA: Insufficient documentation

## 2014-05-02 DIAGNOSIS — E785 Hyperlipidemia, unspecified: Secondary | ICD-10-CM | POA: Diagnosis not present

## 2014-05-02 DIAGNOSIS — R350 Frequency of micturition: Secondary | ICD-10-CM | POA: Diagnosis present

## 2014-05-02 DIAGNOSIS — F0390 Unspecified dementia without behavioral disturbance: Secondary | ICD-10-CM | POA: Insufficient documentation

## 2014-05-02 DIAGNOSIS — Z79899 Other long term (current) drug therapy: Secondary | ICD-10-CM | POA: Diagnosis not present

## 2014-05-02 LAB — CBC WITH DIFFERENTIAL/PLATELET
Basophils Absolute: 0 10*3/uL (ref 0.0–0.1)
Basophils Relative: 1 % (ref 0–1)
EOS ABS: 0.1 10*3/uL (ref 0.0–0.7)
Eosinophils Relative: 2 % (ref 0–5)
HCT: 37.7 % (ref 36.0–46.0)
Hemoglobin: 12 g/dL (ref 12.0–15.0)
LYMPHS ABS: 1.2 10*3/uL (ref 0.7–4.0)
LYMPHS PCT: 38 % (ref 12–46)
MCH: 29.5 pg (ref 26.0–34.0)
MCHC: 31.8 g/dL (ref 30.0–36.0)
MCV: 92.6 fL (ref 78.0–100.0)
MONOS PCT: 7 % (ref 3–12)
Monocytes Absolute: 0.2 10*3/uL (ref 0.1–1.0)
NEUTROS PCT: 52 % (ref 43–77)
Neutro Abs: 1.6 10*3/uL — ABNORMAL LOW (ref 1.7–7.7)
Platelets: 162 10*3/uL (ref 150–400)
RBC: 4.07 MIL/uL (ref 3.87–5.11)
RDW: 13.7 % (ref 11.5–15.5)
WBC: 3.1 10*3/uL — AB (ref 4.0–10.5)

## 2014-05-02 LAB — BASIC METABOLIC PANEL
Anion gap: 13 (ref 5–15)
BUN: 17 mg/dL (ref 6–23)
CHLORIDE: 107 meq/L (ref 96–112)
CO2: 25 meq/L (ref 19–32)
Calcium: 9.2 mg/dL (ref 8.4–10.5)
Creatinine, Ser: 1.04 mg/dL (ref 0.50–1.10)
GFR calc Af Amer: 58 mL/min — ABNORMAL LOW (ref >90)
GFR calc non Af Amer: 50 mL/min — ABNORMAL LOW (ref >90)
GLUCOSE: 84 mg/dL (ref 70–99)
POTASSIUM: 4 meq/L (ref 3.7–5.3)
SODIUM: 145 meq/L (ref 137–147)

## 2014-05-02 LAB — URINALYSIS, ROUTINE W REFLEX MICROSCOPIC
Bilirubin Urine: NEGATIVE
GLUCOSE, UA: NEGATIVE mg/dL
HGB URINE DIPSTICK: NEGATIVE
Ketones, ur: NEGATIVE mg/dL
Leukocytes, UA: NEGATIVE
Nitrite: NEGATIVE
PH: 7.5 (ref 5.0–8.0)
Protein, ur: 30 mg/dL — AB
SPECIFIC GRAVITY, URINE: 1.016 (ref 1.005–1.030)
Urobilinogen, UA: 1 mg/dL (ref 0.0–1.0)

## 2014-05-02 LAB — URINE MICROSCOPIC-ADD ON

## 2014-05-02 NOTE — Discharge Instructions (Signed)
Please read and follow all provided instructions.  Your diagnoses today include:  1. Urinary frequency     Tests performed today include:  Urine test - does not show any infections or blood  Blood counts and electrolytes - no concerning changing  Vital signs. See below for your results today.   Medications prescribed:   None  Take any prescribed medications only as directed.  Home care instructions:  Follow any educational materials contained in this packet.  Follow-up instructions: Please follow-up with your primary care provider in the next 3 days for further evaluation of your symptoms.   Return instructions:   Please return to the Emergency Department if you experience worsening symptoms.   Return with fever, abdominal pain  Please return if you have any other emergent concerns.  Additional Information:  Your vital signs today were: BP 150/87   Pulse 73   Temp(Src) 98.2 F (36.8 C) (Oral)   Resp 21   SpO2 100% If your blood pressure (BP) was elevated above 135/85 this visit, please have this repeated by your doctor within one month. --------------

## 2014-05-02 NOTE — ED Provider Notes (Signed)
CSN: [id number]     Arrival date & time 05/02/14  1127 History   First MD Initiated Contact with Patient 05/02/14 1154     Chief Complaint  Patient presents with  . Urinary Frequency     (Consider location/radiation/quality/duration/timing/severity/associated sxs/prior Treatment) HPI Comments: Patient with h/o dementia, diastolic heart failure -- presents with complaint of frequent urination. History obtained from family at bedside and son over the telephone. Patient was diagnosed with dementia 4 months ago. Son states that she has been complaining about 'every little thing'. Patient was placed on a fluid pill. Unfortunately no one can tell me the name of dose of the medication in question. She takes this in the morning and at night. Patient complains of having to get up frequently to urinate, especially at night. No dysuria, hematuria. No fever, nausea or vomiting. Family believes this is because of her medication. The onset of this condition was acute. The course is constant. Aggravating factors: none. Alleviating factors: none.    Patient is a 80 y.o. female presenting with frequency. The history is provided by a relative.  Urinary Frequency Pertinent negatives include no abdominal pain, chest pain, coughing, fever, headaches, myalgias, nausea, rash, sore throat or vomiting.    Past Medical History  Diagnosis Date  . HTN (hypertension)     a. Resistant HTN. Patient states that this has been poorly controlled for years.   . Diastolic HF (heart failure)     Echo (7/11): EF 60-65%, moderate LVH, severe diastolic dysfunction.   . Iron deficiency anemia   . Chronic atrial fibrillation     a. Chronic. Has had cardioembolic right renal infarct in 12/06 and cardioembolic event to the right arm requiring right brachial embolectomy. Both events occurred while subtherapeutic on coumadin. b. Slow VR 12/2013.  . Obese   . Cerebrovascular accident, embolic     left frontal  . Dyslipidemia   . CAD  (coronary artery disease)     a. Possible dx -  Lexiscan myoview (9/11): EF 76%, normal wall motion, possible small anterior MI with mild peri-infarct ischemia but cannot rule out breast attenuation, managed conservatively.  Marland Kitchen. PAD (peripheral artery disease)     a. peripheral arterial dopplers (5/12) with right mid popliteal artery occlusion with reconstitution of arteries below the knees by collaterals. Only mild reduction in ABI on the right (not tissue-threatening).   Past Surgical History  Procedure Laterality Date  . Embolectomy      right brachial embolectomy   Family History  Problem Relation Age of Onset  . Hypertension      multipe relatives with Resistant HTN.   History  Substance Use Topics  . Smoking status: Never Smoker   . Smokeless tobacco: Not on file  . Alcohol Use: No   OB History   Grav Para Term Preterm Abortions TAB SAB Ect Mult Living                 Review of Systems  Constitutional: Negative for fever.  HENT: Negative for rhinorrhea and sore throat.   Eyes: Negative for redness.  Respiratory: Negative for cough.   Cardiovascular: Negative for chest pain.  Gastrointestinal: Negative for nausea, vomiting, abdominal pain and diarrhea.  Genitourinary: Positive for frequency. Negative for dysuria.  Musculoskeletal: Negative for myalgias.  Skin: Negative for rash.  Neurological: Negative for headaches.    Allergies  Review of patient's allergies indicates no known allergies.  Home Medications   Prior to Admission medications   Medication Sig  Start Date End Date Taking? Authorizing Provider  amLODipine (NORVASC) 10 MG tablet Take 10 mg by mouth daily.   Yes Historical Provider, MD  atorvastatin (LIPITOR) 80 MG tablet Take 80 mg by mouth daily at 6 PM.   Yes Historical Provider, MD  donepezil (ARICEPT) 5 MG tablet Take 5 mg by mouth at bedtime.   Yes Historical Provider, MD  lisinopril (PRINIVIL,ZESTRIL) 40 MG tablet Take 40 mg by mouth daily.   Yes  Historical Provider, MD  warfarin (COUMADIN) 4 MG tablet Take 0.5-1 tablets (2-4 mg total) by mouth every evening. Takes 2mg  on sun, tues, thurs, and sat. 4mg  on m,w,f 01/10/14  Yes [name], NP   BP 163/117  Pulse 72  Temp(Src) 98.2 F (36.8 C) (Oral)  Resp 18  SpO2 98%  Physical Exam  Nursing note and vitals reviewed. Constitutional: She appears well-developed and well-nourished.  HENT:  Head: Normocephalic and atraumatic.  Mouth/Throat: Oropharynx is clear and moist.  Eyes: Conjunctivae are normal. Right eye exhibits no discharge. Left eye exhibits no discharge.  Neck: Normal range of motion. Neck supple. No JVD present.  Cardiovascular: Normal rate, regular rhythm and normal heart sounds.   Pulmonary/Chest: Effort normal and breath sounds normal. No respiratory distress. She has no wheezes. She has no rales.  Abdominal: Soft. There is no tenderness.  Musculoskeletal:  No significant LE edema.  Neurological: She is alert.  Patient confused, gives limited history. This is her baseline per family at bedside.   Skin: Skin is warm and dry.  Psychiatric: She has a normal mood and affect.    ED Course  Procedures (including critical care time) Labs Review Labs Reviewed  CBC WITH DIFFERENTIAL - Abnormal; Notable for the following:    WBC 3.1 (*)    Neutro Abs 1.6 (*)    All other components within normal limits  BASIC METABOLIC PANEL - Abnormal; Notable for the following:    GFR calc non Af Amer 50 (*)    GFR calc Af Amer 58 (*)    All other components within normal limits  URINALYSIS, ROUTINE W REFLEX MICROSCOPIC - Abnormal; Notable for the following:    Protein, ur 30 (*)    All other components within normal limits  URINE MICROSCOPIC-ADD ON - Abnormal; Notable for the following:    Squamous Epithelial / LPF FEW (*)    Casts HYALINE CASTS (*)    All other components within normal limits    Imaging Review No results found.   EKG Interpretation None       12:13 PM Patient seen and examined. Work-up initiated. Will check labs and UA. Ensure no infection.    Vital signs reviewed and are as follows: BP 163/117  Pulse 72  Temp(Src) 98.2 F (36.8 C) (Oral)  Resp 18  SpO2 98%  2:06 PM Labs reviewed. Patient discussed with Dr. Clarene Duke.   Patient and family informed at bedside. Family able to verify with niece that she is not on any fluid pills. I will not make any medication changes today. Family states that patient should be able to see PCP this week. I encouraged them to follow-up.   Patient urged to return with worsening symptoms or other concerns. Patient verbalized understanding and agrees with plan.     MDM   Final diagnoses:  Urinary frequency   Patient with frequent urination without blood or pain. Initial confusion as to whether or not patient is on a diuretic. Apparently she is not. Regardless, baseline  kidney function, electrolytes. UA without signs of infection. Pt discussed with attending. There are no indications for admission today. Patient has reliable PCP followup.  No dangerous or life-threatening conditions suspected or identified by history, physical exam, and by work-up. No indications for hospitalization identified.     Renne Crigler, PA-C 05/02/14 1409

## 2014-05-02 NOTE — ED Notes (Signed)
PT comfortable with discharge and follow up instructions. No prescriptions. 

## 2014-05-02 NOTE — ED Notes (Signed)
ED PA at bedside

## 2014-05-02 NOTE — ED Notes (Signed)
Pt reports "her water is jumping" at night, later clarifying she is getting up a lot at night to urinate. Denies any pain. No n.v.d. Pt is alert.

## 2014-05-05 NOTE — ED Provider Notes (Signed)
Medical screening examination/treatment/procedure(s) were performed by non-physician practitioner and as supervising physician I was immediately available for consultation/collaboration.   EKG Interpretation None        Samuel JesterKathleen Salli Bodin, DO 05/05/14 1501

## 2014-05-09 ENCOUNTER — Ambulatory Visit: Payer: Medicare Other | Attending: Internal Medicine

## 2014-05-09 DIAGNOSIS — Z7901 Long term (current) use of anticoagulants: Secondary | ICD-10-CM

## 2014-05-09 DIAGNOSIS — Z5181 Encounter for therapeutic drug level monitoring: Secondary | ICD-10-CM

## 2014-05-09 DIAGNOSIS — I4891 Unspecified atrial fibrillation: Secondary | ICD-10-CM

## 2014-05-09 LAB — POCT INR: INR: 1.5

## 2014-05-09 MED ORDER — AMLODIPINE BESYLATE 10 MG PO TABS: 10.0000 mg | ORAL_TABLET | Freq: Every day | ORAL | Status: DC

## 2014-05-09 MED ORDER — LISINOPRIL 40 MG PO TABS: 40.0000 mg | ORAL_TABLET | Freq: Every day | ORAL | Status: DC

## 2014-05-12 ENCOUNTER — Ambulatory Visit: Payer: Medicare Other | Admitting: Internal Medicine

## 2014-05-16 ENCOUNTER — Ambulatory Visit: Payer: Medicare Other | Attending: Internal Medicine

## 2014-05-16 DIAGNOSIS — Z7901 Long term (current) use of anticoagulants: Secondary | ICD-10-CM

## 2014-05-16 DIAGNOSIS — I4891 Unspecified atrial fibrillation: Secondary | ICD-10-CM

## 2014-05-16 DIAGNOSIS — Z5181 Encounter for therapeutic drug level monitoring: Secondary | ICD-10-CM

## 2014-05-16 LAB — POCT INR: INR: 1.6

## 2014-05-23 ENCOUNTER — Ambulatory Visit: Payer: Medicare Other | Admitting: Internal Medicine

## 2014-05-31 ENCOUNTER — Other Ambulatory Visit: Payer: Self-pay | Admitting: Internal Medicine

## 2014-06-13 ENCOUNTER — Other Ambulatory Visit: Payer: Self-pay | Admitting: Internal Medicine

## 2014-06-16 ENCOUNTER — Other Ambulatory Visit: Payer: Self-pay

## 2014-06-16 ENCOUNTER — Ambulatory Visit: Payer: Medicare Other | Attending: Internal Medicine | Admitting: Internal Medicine

## 2014-06-16 ENCOUNTER — Ambulatory Visit (HOSPITAL_COMMUNITY)
Admission: RE | Admit: 2014-06-16 | Discharge: 2014-06-16 | Disposition: A | Discharge status: Home / Self Care | Payer: Medicare Other | Source: Ambulatory Visit | Attending: Cardiology | Admitting: Cardiology

## 2014-06-16 ENCOUNTER — Encounter: Payer: Self-pay | Admitting: Internal Medicine

## 2014-06-16 VITALS — BP 146/88 | HR 76 | Temp 97.9°F | Resp 16 | Ht 63.0 in | Wt 165.6 lb

## 2014-06-16 DIAGNOSIS — E669 Obesity, unspecified: Secondary | ICD-10-CM | POA: Insufficient documentation

## 2014-06-16 DIAGNOSIS — Z23 Encounter for immunization: Secondary | ICD-10-CM | POA: Diagnosis not present

## 2014-06-16 DIAGNOSIS — Z8673 Personal history of transient ischemic attack (TIA), and cerebral infarction without residual deficits: Secondary | ICD-10-CM | POA: Insufficient documentation

## 2014-06-16 DIAGNOSIS — I10 Essential (primary) hypertension: Secondary | ICD-10-CM | POA: Diagnosis not present

## 2014-06-16 DIAGNOSIS — Z8249 Family history of ischemic heart disease and other diseases of the circulatory system: Secondary | ICD-10-CM | POA: Diagnosis not present

## 2014-06-16 DIAGNOSIS — I4891 Unspecified atrial fibrillation: Secondary | ICD-10-CM | POA: Insufficient documentation

## 2014-06-16 DIAGNOSIS — I509 Heart failure, unspecified: Secondary | ICD-10-CM | POA: Diagnosis not present

## 2014-06-16 DIAGNOSIS — Z9119 Patient's noncompliance with other medical treatment and regimen: Secondary | ICD-10-CM | POA: Insufficient documentation

## 2014-06-16 DIAGNOSIS — I5032 Chronic diastolic (congestive) heart failure: Secondary | ICD-10-CM | POA: Insufficient documentation

## 2014-06-16 DIAGNOSIS — Z7982 Long term (current) use of aspirin: Secondary | ICD-10-CM | POA: Diagnosis not present

## 2014-06-16 DIAGNOSIS — I739 Peripheral vascular disease, unspecified: Secondary | ICD-10-CM | POA: Diagnosis not present

## 2014-06-16 DIAGNOSIS — D509 Iron deficiency anemia, unspecified: Secondary | ICD-10-CM | POA: Diagnosis not present

## 2014-06-16 DIAGNOSIS — R079 Chest pain, unspecified: Secondary | ICD-10-CM | POA: Insufficient documentation

## 2014-06-16 DIAGNOSIS — E785 Hyperlipidemia, unspecified: Secondary | ICD-10-CM | POA: Diagnosis not present

## 2014-06-16 DIAGNOSIS — Z79899 Other long term (current) drug therapy: Secondary | ICD-10-CM | POA: Diagnosis not present

## 2014-06-16 DIAGNOSIS — Z7901 Long term (current) use of anticoagulants: Secondary | ICD-10-CM | POA: Diagnosis not present

## 2014-06-16 DIAGNOSIS — I2510 Atherosclerotic heart disease of native coronary artery without angina pectoris: Secondary | ICD-10-CM | POA: Diagnosis not present

## 2014-06-16 DIAGNOSIS — Z91199 Patient's noncompliance with other medical treatment and regimen due to unspecified reason: Secondary | ICD-10-CM | POA: Insufficient documentation

## 2014-06-16 DIAGNOSIS — I2583 Coronary atherosclerosis due to lipid rich plaque: Secondary | ICD-10-CM

## 2014-06-16 LAB — POCT INR: INR: 1.5

## 2014-06-16 MED ORDER — AMLODIPINE BESYLATE 10 MG PO TABS: 10.0000 mg | ORAL_TABLET | Freq: Every day | ORAL | Status: DC

## 2014-06-16 MED ORDER — RIVAROXABAN 20 MG PO TABS: 20.0000 mg | ORAL_TABLET | Freq: Every day | ORAL | Status: DC

## 2014-06-16 MED ORDER — LISINOPRIL 40 MG PO TABS: 40.0000 mg | ORAL_TABLET | Freq: Every day | ORAL | Status: DC

## 2014-06-16 MED ORDER — ATORVASTATIN CALCIUM 80 MG PO TABS: 80.0000 mg | ORAL_TABLET | Freq: Every day | ORAL | Status: DC

## 2014-06-16 NOTE — Patient Instructions (Signed)
Patient needs to stop taking coumadin because it is too high risk for her to miss several office visits Switched patient to Xarelto for the new blood thinner I will need someone to call our office and let us know exactly which medications the patient has been taking   [name] will need a cardiology appointment asap. I need them to coordinate care with me as the PCP and know the changes that have taken place with her blood thinning medication.  They can make further changes to her medications as needed.  If [name] happens to fall or hit her head she needs immediate evaluation!!! Thanks and I look forward to hearing from you as her caregiver.

## 2014-06-16 NOTE — Progress Notes (Signed)
Patient presents with son for f/u on HTN States ran out of all meds 2-3 weeks ago C/O cough with chest soreness for 1 week Also c/o lower abdominal pain twice this week lasting 20 minutes Has had relief from tums

## 2014-06-16 NOTE — Progress Notes (Signed)
Patient ID: [name], female   DOB: [birth year], 80 y.o.   MRN: [id number]  CC:  HTN, A.Fib  HPI:  Patient presents to clinic today for a follow up of hypertension and A.fib.  Patient is a very poor historian and I have concerns about medication competency and compliance.  She has been on Coumadin for years from my knowledge. She has missed several office visits and several INR visits.  She had not had a INR level in one month at our office and she is often sub-therapeutic.  Patient reports that she has to rely on others to give her a ride and to help her to remember appointments.  She presents with her son which states that she sometimes goes to [employer] for Insurance account manager.  Her son is also a very poor historian as well. He is unable to tell me the last time she was seen at a different medical office.  She is able to tell me that she has been out of all her medications for the past three weeks.    No Known Allergies Past Medical History  Diagnosis Date  . HTN (hypertension)     a. Resistant HTN. Patient states that this has been poorly controlled for years.   . Diastolic HF (heart failure)     Echo (7/11): EF 60-65%, moderate LVH, severe diastolic dysfunction.   . Iron deficiency anemia   . Chronic atrial fibrillation     a. Chronic. Has had cardioembolic right renal infarct in 12/06 and cardioembolic event to the right arm requiring right brachial embolectomy. Both events occurred while subtherapeutic on coumadin. b. Slow VR 12/2013.  . Obese   . Cerebrovascular accident, embolic     left frontal  . Dyslipidemia   . CAD (coronary artery disease)     a. Possible dx -  Lexiscan myoview (9/11): EF 76%, normal wall motion, possible small anterior MI with mild peri-infarct ischemia but cannot rule out breast attenuation, managed conservatively.  Marland Kitchen PAD (peripheral artery disease)     a. peripheral arterial dopplers (5/12) with right mid popliteal artery occlusion with reconstitution of arteries  below the knees by collaterals. Only mild reduction in ABI on the right (not tissue-threatening).   Current Outpatient Prescriptions on File Prior to Visit  Medication Sig Dispense Refill  . amLODipine (NORVASC) 10 MG tablet Take 1 tablet (10 mg total) by mouth daily.  30 tablet  2  . atorvastatin (LIPITOR) 80 MG tablet Take 80 mg by mouth daily at 6 PM.      . donepezil (ARICEPT) 5 MG tablet Take 5 mg by mouth at bedtime.      Marland Kitchen lisinopril (PRINIVIL,ZESTRIL) 40 MG tablet Take 1 tablet (40 mg total) by mouth daily.  30 tablet  2   No current facility-administered medications on file prior to visit.   Family History  Problem Relation Age of Onset  . Hypertension      multipe relatives with Resistant HTN.  Marland Kitchen Hypertension Mother    History   Social History  . Marital Status: Widowed    Spouse Name: N/A    Number of Children: 3  . Years of Education: N/A   Occupational History  . Not on file.   Social History Main Topics  . Smoking status: Never Smoker   . Smokeless tobacco: Not on file  . Alcohol Use: No  . Drug Use: Not on file  . Sexual Activity: Not on file   Other Topics Concern  .  Not on file   Social History Narrative  . No narrative on file   Review of Systems  Respiratory: Positive for cough. Negative for shortness of breath and wheezing.   Cardiovascular: Negative for chest pain, palpitations, orthopnea and leg swelling.  Gastrointestinal: Negative for blood in stool.  Genitourinary: Negative.   Neurological: Negative for dizziness and headaches.  Endo/Heme/Allergies: Does not bruise/bleed easily.    Patient is unable to answer questions clearly, she is very difficult to understand   Objective:  There were no vitals filed for this visit.  Physical Exam  Constitutional: No distress.  HENT:  Right Ear: External ear normal.  Left Ear: External ear normal.  Mouth/Throat: Oropharynx is clear and moist.  Eyes: Conjunctivae and EOM are normal. Pupils are  equal, round, and reactive to light.  Neck: Normal range of motion. Neck supple.  Cardiovascular: Intact distal pulses.   Very irregular rhythm-A.fib  Pulmonary/Chest: Effort normal and breath sounds normal. She has no wheezes.  Musculoskeletal: Normal range of motion. She exhibits no edema and no tenderness.  Neurological: She is alert. She has normal reflexes.  Skin: Skin is warm and dry. She is not diaphoretic.     Lab Results  Component Value Date   WBC 3.1* 05/02/2014   HGB 12.0 05/02/2014   HCT 37.7 05/02/2014   MCV 92.6 05/02/2014   PLT 162 05/02/2014   Lab Results  Component Value Date   CREATININE 1.04 05/02/2014   BUN 17 05/02/2014   NA 145 05/02/2014   K 4.0 05/02/2014   CL 107 05/02/2014   CO2 25 05/02/2014    Lab Results  Component Value Date   HGBA1C 5.4 12/29/2013   Lipid Panel     Component Value Date/Time   CHOL 95 12/29/2013 0444   TRIG 36 12/29/2013 0444   HDL 37* 12/29/2013 0444   CHOLHDL 2.6 12/29/2013 0444   VLDL 7 12/29/2013 0444   LDLCALC 51 12/29/2013 0444       Assessment and plan:   [name] was seen today for follow-up and hypertension.  Diagnoses and associated orders for this visit:  Atrial fibrillation, unspecified - rivaroxaban (XARELTO) 20 MG TABS tablet; Take 1 tablet (20 mg total) by mouth daily with supper.  Patient is very high risk and was non-compliant with Coumadin.  Unsure of patients competency level and support at home, will refer to home health to help manage patient medications.  - Ambulatory referral to Cardiology-- Patient was scheduled for a cardiology appointment while in office for Monday at 11:45  Chest pain, unspecified Likely due to persistent cough - EKG 12-Lead--non-acute changes on EKG in office  Essential hypertension Refilled-will need family to call and give all medications that patient is currently taking. I am unsure if patient is on Coreg or not. Cardiology may make regimen changes as needed  - lisinopril  (PRINIVIL,ZESTRIL) 40 MG tablet; Take 1 tablet (40 mg total) by mouth daily. - amLODipine (NORVASC) 10 MG tablet; Take 1 tablet (10 mg total) by mouth daily.   Coronary artery disease due to lipid rich plaque - Refilled atorvastatin (LIPITOR) 80 MG tablet; Take 1 tablet (80 mg total) by mouth daily at 6 PM.---Unsure if patient has been taking statin.  Explained the importance of statin therapy in CAD. Explained that she may call office if she experiences muscle pains or alert the cardiologist on next visit  Long term (current) use of anticoagulants - INR--1.5.  Transitioned patient to Xarelto. Lower risk of bleeds and  subtherauetic levels.  Patient is at very high risk for repeat thromboembolic events  Need for prophylactic vaccination against Streptococcus pneumoniae (pneumococcus) - Received Pneumococcal conjugate vaccine 13-valent  Need for prophylactic vaccination and inoculation against influenza - Received Flu Vaccine QUAD 36+ mos PF IM (Fluarix Quad PF)    Return in about 6 weeks (around 07/28/2014) for Hypertension.       Holland Commons, NP-C [facility] [phone] 06/26/2014, 7:40 PM

## 2014-06-17 ENCOUNTER — Telehealth: Payer: Self-pay | Admitting: Internal Medicine

## 2014-06-17 NOTE — Telephone Encounter (Signed)
Patient's caregiver [name] calling to discuss/ confirm medications. [name] states patient received: atorvastatin (LIPITOR) 80 MG tablet [[id number]]  ,   lisinopril (PRINIVIL,ZESTRIL) 40 MG tablet [[id number]],  And  amLODipine (NORVASC) 10 MG tablet [[id number]]. [name] states that they did not receive the: rivaroxaban (XARELTO) 20 MG TABS tablet [[id number]] [name] also would like to request a refill on a medication that the patient is running low on. She will discuss with you when you call. Please follow up.

## 2014-06-20 ENCOUNTER — Encounter: Payer: Self-pay | Admitting: Physician Assistant

## 2014-06-20 ENCOUNTER — Ambulatory Visit (INDEPENDENT_AMBULATORY_CARE_PROVIDER_SITE_OTHER): Payer: Medicare Other | Admitting: Physician Assistant

## 2014-06-20 VITALS — BP 140/82 | HR 85 | Ht 65.0 in | Wt 168.0 lb

## 2014-06-20 DIAGNOSIS — I2583 Coronary atherosclerosis due to lipid rich plaque: Secondary | ICD-10-CM

## 2014-06-20 DIAGNOSIS — I10 Essential (primary) hypertension: Secondary | ICD-10-CM

## 2014-06-20 DIAGNOSIS — I2510 Atherosclerotic heart disease of native coronary artery without angina pectoris: Secondary | ICD-10-CM

## 2014-06-20 DIAGNOSIS — I739 Peripheral vascular disease, unspecified: Secondary | ICD-10-CM

## 2014-06-20 DIAGNOSIS — I5032 Chronic diastolic (congestive) heart failure: Secondary | ICD-10-CM

## 2014-06-20 DIAGNOSIS — I4891 Unspecified atrial fibrillation: Secondary | ICD-10-CM

## 2014-06-20 DIAGNOSIS — Z7901 Long term (current) use of anticoagulants: Secondary | ICD-10-CM

## 2014-06-20 MED ORDER — CARVEDILOL 12.5 MG PO TABS: 6.2500 mg | ORAL_TABLET | Freq: Two times a day (BID) | ORAL | Status: DC

## 2014-06-20 NOTE — Assessment & Plan Note (Signed)
Blood pressure medications resume by primary care last week. Patient had been out of all medications for 3 weeks.

## 2014-06-20 NOTE — Assessment & Plan Note (Signed)
Patient's heart rate is stable off rate lowering medications. We'll not make any changes. Patient was given prescription for Xarelto but they did not give her the medications at the free clinic. They're going by there today to try to obtain Xarelto. She had been on Coumadin but was noncompliant with this.

## 2014-06-20 NOTE — Progress Notes (Signed)
[name] is a 80 y.o. female patient of Dr. Shirlee Latch with a hx of atrial fibrillation, diastolic CHF, HTN, and cardioembolic events to the right kidney, brain, and right arm. Echo in 2011 showed moderate LVH, EF 60-65%, and severe diastolic dysfunction with moderate biatrial enlargement. Lexiscan myoview in 2011 showed a possible small anterior MI with mild peri-infarct ischemia versus breast artifact. She has PAD with an occluded popliteal artery and reconstitution via collaterals by arterial dopplers.  Last seen by Dr. Marca Ancona in 01/2012.  ABIs (process 2013): Right 0.82; left 1.1; known occluded right popliteal artery  Patient is an extremely difficult historian and was recently diagnosed with dementia. She is here today with her grandson's wife. She recently saw her primary care after she had been off medications for 3 weeks. We are not sure how long she has been off her Coumadin. Xarelto was started but she was not given this medication at the clinic off [address], so hasn't started it. Her Coreg was stopped in April 2015 when she was hospitalized and found to be bradycardic in the 40s.. She lives with her two sons that are also sick. She denies any chest pain, palpitations, dyspnea, dyspnea on exertion, dizziness or presyncope her only complaint is right lower leg pain when she gets up at night but once she straightens her leg out it is fine.  No Known Allergies   Current Outpatient Prescriptions  Medication Sig Dispense Refill  . amLODipine (NORVASC) 10 MG tablet Take 1 tablet (10 mg total) by mouth daily.  30 tablet  4  . atorvastatin (LIPITOR) 80 MG tablet Take 1 tablet (80 mg total) by mouth daily at 6 PM.  30 tablet  4  . donepezil (ARICEPT) 5 MG tablet Take 5 mg by mouth at bedtime.      Marland Kitchen lisinopril (PRINIVIL,ZESTRIL) 40 MG tablet Take 1 tablet (40 mg total) by mouth daily.  30 tablet  4  . rivaroxaban (XARELTO) 20 MG TABS tablet Take 1 tablet (20 mg total) by mouth  daily with supper.  30 tablet  6   No current facility-administered medications for this visit.    Past Medical History  Diagnosis Date  . HTN (hypertension)     a. Resistant HTN. Patient states that this has been poorly controlled for years.   . Diastolic HF (heart failure)     Echo (7/11): EF 60-65%, moderate LVH, severe diastolic dysfunction.   . Iron deficiency anemia   . Chronic atrial fibrillation     a. Chronic. Has had cardioembolic right renal infarct in 12/06 and cardioembolic event to the right arm requiring right brachial embolectomy. Both events occurred while subtherapeutic on coumadin. b. Slow VR 12/2013.  . Obese   . Cerebrovascular accident, embolic     left frontal  . Dyslipidemia   . CAD (coronary artery disease)     a. Possible dx -  Lexiscan myoview (9/11): EF 76%, normal wall motion, possible small anterior MI with mild peri-infarct ischemia but cannot rule out breast attenuation, managed conservatively.  Marland Kitchen PAD (peripheral artery disease)     a. peripheral arterial dopplers (5/12) with right mid popliteal artery occlusion with reconstitution of arteries below the knees by collaterals. Only mild reduction in ABI on the right (not tissue-threatening).    Past Surgical History  Procedure Laterality Date  . Embolectomy      right brachial embolectomy    Family History  Problem Relation Age of Onset  .  Hypertension      multipe relatives with Resistant HTN.  Marland Kitchen Hypertension Mother     History   Social History  . Marital Status: Widowed    Spouse Name: N/A    Number of Children: 3  . Years of Education: N/A   Occupational History  . Not on file.   Social History Main Topics  . Smoking status: Never Smoker   . Smokeless tobacco: Not on file  . Alcohol Use: No  . Drug Use: Not on file  . Sexual Activity: Not on file   Other Topics Concern  . Not on file   Social History Narrative  . No narrative on file    ROS: Poor historian, dementia  BP  140/82  Pulse 85  Ht  (1.651 m)  Wt 168 lb (76.204 kg)  BMI 27.96 kg/m2  PHYSICAL EXAM: Well-nournished, in no acute distress. Neck: No JVD, HJR, Bruit, or thyroid enlargement  Lungs: Decreased breath sounds throughout, No tachypnea, clear without wheezing, rales, or rhonchi  Cardiovascular: Irregular irregular, PMI not displaced, 2/6 systolic murmur at the left sternal border, no gallops, bruit, thrill, or heave.  Abdomen: BS normal. Soft without organomegaly, masses, lesions or tenderness.  Extremities: without cyanosis, clubbing or edema. Decreased distal pulses bilateral  SKin: Warm, no lesions or rashes   Musculoskeletal: No deformities  Neuro: no focal signs   Wt Readings from Last 3 Encounters:  06/20/14 168 lb (76.204 kg)  06/16/14 165 lb 9.6 oz (75.116 kg)  01/10/14 181 lb (82.101 kg)     EKG: Atrial fibrillation at 85 beats per minute nonspecific ST-T wave changes, no acute change

## 2014-06-20 NOTE — Telephone Encounter (Signed)
Left message for pt caregiver to call nurse line when message received

## 2014-06-20 NOTE — Patient Instructions (Signed)
Your physician has recommended you make the following change in your medication:    START COREG 12.5MG  TAKE HALF 6.25 TWICE A DAY   Your physician recommends that you schedule a follow-up appointment in: WITH DR [facility] IN 4 TO 6 MONTHS

## 2014-06-20 NOTE — Assessment & Plan Note (Signed)
Not reliable taking coumdin-missed many doses and INR's. Switched to Xarelto by primary care, but has not yet started. Going to get it today.

## 2014-06-20 NOTE — Assessment & Plan Note (Signed)
Patient complains of right leg pain when she gets up at night, but it resolves quickly when she straightens her leg out.

## 2014-06-20 NOTE — Assessment & Plan Note (Signed)
No evidence of heart failure on exam 

## 2014-06-30 ENCOUNTER — Telehealth: Payer: Self-pay | Admitting: Emergency Medicine

## 2014-06-30 NOTE — Telephone Encounter (Signed)
Left message with pt grand daughter that [employer] has approved medication Xarelto dated 06/22/2014- 06/23/2015 [facility]

## 2014-07-06 ENCOUNTER — Other Ambulatory Visit: Payer: Self-pay | Admitting: Internal Medicine

## 2014-07-06 MED ORDER — DONEPEZIL HCL 5 MG PO TABS: 5.0000 mg | ORAL_TABLET | Freq: Every day | ORAL | Status: DC

## 2014-08-15 ENCOUNTER — Other Ambulatory Visit: Payer: Self-pay | Admitting: Internal Medicine

## 2014-08-23 ENCOUNTER — Ambulatory Visit: Payer: Medicare Other | Admitting: Internal Medicine

## 2014-10-24 ENCOUNTER — Other Ambulatory Visit: Payer: Self-pay | Admitting: Internal Medicine

## 2014-10-31 ENCOUNTER — Ambulatory Visit: Payer: Medicare Other | Admitting: Cardiology

## 2014-11-03 ENCOUNTER — Encounter: Payer: Self-pay | Admitting: Cardiology

## 2014-11-03 ENCOUNTER — Encounter: Payer: Self-pay | Admitting: *Deleted

## 2014-11-03 ENCOUNTER — Ambulatory Visit (INDEPENDENT_AMBULATORY_CARE_PROVIDER_SITE_OTHER): Payer: Medicare Other | Admitting: Cardiology

## 2014-11-03 VITALS — BP 128/90 | HR 105 | Ht 65.0 in | Wt 170.0 lb

## 2014-11-03 DIAGNOSIS — I739 Peripheral vascular disease, unspecified: Secondary | ICD-10-CM

## 2014-11-03 DIAGNOSIS — I482 Chronic atrial fibrillation, unspecified: Secondary | ICD-10-CM

## 2014-11-03 DIAGNOSIS — I4891 Unspecified atrial fibrillation: Secondary | ICD-10-CM

## 2014-11-03 DIAGNOSIS — I2510 Atherosclerotic heart disease of native coronary artery without angina pectoris: Secondary | ICD-10-CM

## 2014-11-03 DIAGNOSIS — I5032 Chronic diastolic (congestive) heart failure: Secondary | ICD-10-CM

## 2014-11-03 NOTE — Patient Instructions (Addendum)
Your physician recommends that you have lab work today--BMET/CBCd/Lipid profile.  Your physician has requested that you have a lower or upper extremity arterial duplex. This test is an ultrasound of the arteries in the legs or arms. It looks at arterial blood flow in the legs and arms. Allow one hour for Lower and Upper Arterial scans. There are no restrictions or special instructions  Your physician wants you to follow-up in: 6 months with Dr Shirlee LatchMcLean. (August 2016). You will receive a reminder letter in the mail two months in advance. If you don't receive a letter, please call our office to schedule the follow-up appointment.

## 2014-11-04 LAB — CBC WITH DIFFERENTIAL/PLATELET
Basophils Absolute: 0 10*3/uL (ref 0.0–0.1)
Basophils Relative: 0.9 % (ref 0.0–3.0)
EOS PCT: 1.8 % (ref 0.0–5.0)
Eosinophils Absolute: 0.1 10*3/uL (ref 0.0–0.7)
HCT: 39.4 % (ref 36.0–46.0)
HEMOGLOBIN: 13.2 g/dL (ref 12.0–15.0)
LYMPHS ABS: 1.4 10*3/uL (ref 0.7–4.0)
Lymphocytes Relative: 41.2 % (ref 12.0–46.0)
MCHC: 33.5 g/dL (ref 30.0–36.0)
MCV: 91 fl (ref 78.0–100.0)
MONO ABS: 0.4 10*3/uL (ref 0.1–1.0)
Monocytes Relative: 10.8 % (ref 3.0–12.0)
Neutro Abs: 1.5 10*3/uL (ref 1.4–7.7)
Neutrophils Relative %: 45.3 % (ref 43.0–77.0)
Platelets: 170 10*3/uL (ref 150.0–400.0)
RBC: 4.33 Mil/uL (ref 3.87–5.11)
RDW: 14.3 % (ref 11.5–15.5)
WBC: 3.3 10*3/uL — AB (ref 4.0–10.5)

## 2014-11-04 LAB — BASIC METABOLIC PANEL
BUN: 20 mg/dL (ref 6–23)
CALCIUM: 9.5 mg/dL (ref 8.4–10.5)
CO2: 28 meq/L (ref 19–32)
CREATININE: 0.92 mg/dL (ref 0.40–1.20)
Chloride: 107 mEq/L (ref 96–112)
GFR: 75.53 mL/min (ref >60.00)
Glucose, Bld: 76 mg/dL (ref 70–99)
Potassium: 4.3 mEq/L (ref 3.5–5.1)
Sodium: 141 mEq/L (ref 135–145)

## 2014-11-04 LAB — LIPID PANEL
CHOLESTEROL: 126 mg/dL (ref 0–200)
HDL: 48.8 mg/dL (ref >39.00)
LDL CALC: 65 mg/dL (ref 0–99)
NONHDL: 77.2
TRIGLYCERIDES: 59 mg/dL (ref 0.0–149.0)
Total CHOL/HDL Ratio: 3
VLDL: 11.8 mg/dL (ref 0.0–40.0)

## 2014-11-05 NOTE — Progress Notes (Signed)
PCP: Dr. Luna GlasgowKeck  81 yo with history of atrial fibrillation, dementia, diastolic CHF, HTN, and cardioembolic events to the right kidney, brain, and right arm returns for cardiology followup.  Lexiscan myoview in 2011 showed a possible small anterior MI with mild peri-infarct ischemia versus breast artifact.  She has PAD with an occluded popliteal artery and reconstitution via collaterals by arterial dopplers. Echo (4/15) with EF 55-60%.    Most of history comes from her son.  She remains in atrial fibrillation, rate controlled.  She seems to be stable symptomatically with no dyspnea, chest pain, lightheadedness/syncope, or BRBPR/melena. She denies claudication-type pain, no pedal ulcerations.   ECG: atrial fibrillation at 91, lateral TWF  Labs (8/11): BNP 212 Labs (9/11): K 4.3, creatinine 1.0 Labs (10/11): K 4.1, creatinine 1.1, LDL 143, HDL 42 Labs (11/11): BNP 342, K 3.8, creatinine  Labs (12/11): K 4.2, creatinine 1.2, BNP 339, LDL 97, HDL 38, LFTs normal Labs (1/473/12): LDL 97, HDL 38 Labs (5/12): K 3.8, creatinine 1.3 Labs (7/12): LDL 68, HDL 39, BNP 265 Labs (8/12): K 3.2, creatinine 1.07 Labs (2/13): K 4, creatinine 1.3, BNP 230 Labs (8/15): K 4, creatinine 1.04, HCT 37.7  Allergies (verified):  No Known Drug Allergies  Past Medical History: 1. Resistant HTN. Patient states that this has been poorly controlled for years.  2. Diastolic CHF:  Echo (7/11): EF 60-65%, moderate LVH, severe diastolic dysfunction.  Echo (4/15) with EF 55-60%, mild MR, moderate TR, PA systolic pressure 42 mmHg.  3. Iron deficiency anemia. 4. Atrial fibrillation: Chronic. Has had cardioembolic right renal infarct in 12/06 and cardioembolic event to the right arm requiring right brachial embolectomy.  Both events occurred while subtherapeutic on coumadin.  5. Obese.  6. Left frontal embolic cerebrovascular accident. 7. Dyslipidemia. 8. Possible CAD: Lexiscan myoview (9/11): EF 76%, normal wall motion, possible  small anterior MI with mild peri-infarct ischemia but cannot rule out breast attenuation.  9. PAD: peripheral arterial dopplers (5/12) with right mid popliteal artery occlusion with reconstitution of arteries below the knees by collaterals.  Only mild reduction in ABI on the right (not tissue-threatening).  10. Dementia 11. Bradycardia: Coreg stopped.   Family History: Father died at 3839 but she is not sure why.  Multiple family members with resistant HTN.   Social History: She is widowed.  She has three living children.  Lives with son.  She does not use tobacco or drink ETOH.    Review of Systems        All systems reviewed and negative except as per HPI.   Current Outpatient Prescriptions  Medication Sig Dispense Refill  . amLODipine (NORVASC) 10 MG tablet Take 1 tablet (10 mg total) by mouth daily. 30 tablet 4  . atorvastatin (LIPITOR) 80 MG tablet TAKE 1 TABLET BY MOUTH DAILY AT 6 PM. 30 tablet 3  . donepezil (ARICEPT) 5 MG tablet Take 1 tablet (5 mg total) by mouth at bedtime. 30 tablet 4  . lisinopril (PRINIVIL,ZESTRIL) 40 MG tablet TAKE 1 TABLET BY MOUTH ONCE DAILY 30 tablet 2  . rivaroxaban (XARELTO) 20 MG TABS tablet Take 1 tablet (20 mg total) by mouth daily with supper. 30 tablet 6   No current facility-administered medications for this visit.    BP 128/90 mmHg  Pulse 105  Ht 5\' 5"  (1.651 m)  Wt 170 lb (77.111 kg)  BMI 28.29 kg/m2 General:  Obese elderly female in no apparent distress Neck:  Neck supple, JVP 7 cm. No masses, thyromegaly  or abnormal cervical nodes. Lungs:  Clear bilaterally to auscultation and percussion. Heart:  Non-displaced PMI, chest non-tender;  irregular rate and rhythm, S1, S2 without rubs or gallops. 2/6 early systolic murmur along the sternal border.  Carotid upstroke normal, no bruit. Difficult to feel pedal pulses. No edema.  Abdomen:  Bowel sounds positive; abdomen soft and non-tender without masses, organomegaly, or hernias noted. No  hepatosplenomegaly. Extremities:  No clubbing or cyanosis. Neurologic:  Poor memory Psych:  Normal affect.  Assessment/Plan: 1. Atrial fibrillation: Stable chronic atrial fibrillation.  She is off beta blocker due to bradycardia.  Rate is ok today.  Continue Xarelto. Check BMET and CBC.  2. HTN: BP is controlled.  3. CAD: Possible CAD based on prior Cardiolite.  Treat as for secondary prevention.  No chest pain.  No ASA given Xarelto use with stable CAD.  Continue statin.  4. Hyperlipidemia: Check lipids today.  5. PAD: Known significant PAD.  She denies claudication (though her history is suspect given dementia).  I cannot feel her pedal pulses. I will get repeat peripheral arterial dopplers.   Marca Ancona 11/05/2014

## 2014-11-07 ENCOUNTER — Encounter (HOSPITAL_COMMUNITY): Payer: Medicare Other

## 2014-11-07 ENCOUNTER — Telehealth: Payer: Self-pay | Admitting: Cardiology

## 2014-11-07 NOTE — Telephone Encounter (Signed)
Follow Up       Pt returning phone call from [facility] for lab results.

## 2014-11-08 NOTE — Telephone Encounter (Signed)
Patient was notified of lab results 11/07/14. Encounter now closed.

## 2014-11-09 ENCOUNTER — Encounter (HOSPITAL_COMMUNITY): Payer: Self-pay | Admitting: Cardiology

## 2014-11-16 ENCOUNTER — Ambulatory Visit (HOSPITAL_COMMUNITY): Payer: Medicare Other | Attending: Cardiovascular Disease | Admitting: Cardiology

## 2014-11-16 DIAGNOSIS — Z8673 Personal history of transient ischemic attack (TIA), and cerebral infarction without residual deficits: Secondary | ICD-10-CM | POA: Insufficient documentation

## 2014-11-16 DIAGNOSIS — E785 Hyperlipidemia, unspecified: Secondary | ICD-10-CM | POA: Insufficient documentation

## 2014-11-16 DIAGNOSIS — I10 Essential (primary) hypertension: Secondary | ICD-10-CM | POA: Insufficient documentation

## 2014-11-16 DIAGNOSIS — I2510 Atherosclerotic heart disease of native coronary artery without angina pectoris: Secondary | ICD-10-CM | POA: Diagnosis not present

## 2014-11-16 DIAGNOSIS — I739 Peripheral vascular disease, unspecified: Secondary | ICD-10-CM | POA: Insufficient documentation

## 2014-11-16 DIAGNOSIS — I482 Chronic atrial fibrillation, unspecified: Secondary | ICD-10-CM

## 2014-11-16 DIAGNOSIS — I5032 Chronic diastolic (congestive) heart failure: Secondary | ICD-10-CM

## 2014-11-16 NOTE — Progress Notes (Signed)
LEA Doppler/ABI performed 

## 2014-12-27 ENCOUNTER — Other Ambulatory Visit: Payer: Self-pay | Admitting: Internal Medicine

## 2015-02-06 ENCOUNTER — Ambulatory Visit: Payer: Medicare Other | Attending: Internal Medicine | Admitting: Internal Medicine

## 2015-02-06 ENCOUNTER — Encounter: Payer: Self-pay | Admitting: Internal Medicine

## 2015-02-06 VITALS — BP 161/90 | HR 79 | Resp 98 | Ht 63.0 in | Wt 175.0 lb

## 2015-02-06 DIAGNOSIS — I2510 Atherosclerotic heart disease of native coronary artery without angina pectoris: Secondary | ICD-10-CM

## 2015-02-06 DIAGNOSIS — I4891 Unspecified atrial fibrillation: Secondary | ICD-10-CM | POA: Diagnosis not present

## 2015-02-06 DIAGNOSIS — I10 Essential (primary) hypertension: Secondary | ICD-10-CM | POA: Diagnosis not present

## 2015-02-06 DIAGNOSIS — F0280 Dementia in other diseases classified elsewhere without behavioral disturbance: Secondary | ICD-10-CM

## 2015-02-06 DIAGNOSIS — E785 Hyperlipidemia, unspecified: Secondary | ICD-10-CM

## 2015-02-06 DIAGNOSIS — G309 Alzheimer's disease, unspecified: Secondary | ICD-10-CM

## 2015-02-06 DIAGNOSIS — Z79899 Other long term (current) drug therapy: Secondary | ICD-10-CM

## 2015-02-06 MED ORDER — ATORVASTATIN CALCIUM 80 MG PO TABS: ORAL_TABLET | ORAL | Status: DC

## 2015-02-06 MED ORDER — RIVAROXABAN 20 MG PO TABS: 20.0000 mg | ORAL_TABLET | Freq: Every day | ORAL | Status: DC

## 2015-02-06 MED ORDER — AMLODIPINE BESYLATE 10 MG PO TABS: 10.0000 mg | ORAL_TABLET | Freq: Every day | ORAL | Status: DC

## 2015-02-06 MED ORDER — LISINOPRIL 40 MG PO TABS: 40.0000 mg | ORAL_TABLET | Freq: Every day | ORAL | Status: DC

## 2015-02-06 NOTE — Progress Notes (Signed)
Patient ID: [name], female   DOB: Oct 20, 1932, 81 y.o.   MRN: [id number] Subjective:  [name] is a 81 y.o. female with hypertension. Patient reports that she has been out of her medication for 3 days.   Current Outpatient Prescriptions  Medication Sig Dispense Refill  . amLODipine (NORVASC) 10 MG tablet Take 1 tablet (10 mg total) by mouth daily. 30 tablet 4  . atorvastatin (LIPITOR) 80 MG tablet TAKE 1 TABLET BY MOUTH DAILY AT 6 PM. 30 tablet 3  . donepezil (ARICEPT) 5 MG tablet TAKE 1 TABLET BY MOUTH AT BEDTIME. 30 tablet 0  . lisinopril (PRINIVIL,ZESTRIL) 40 MG tablet TAKE 1 TABLET BY MOUTH ONCE DAILY 30 tablet 2  . rivaroxaban (XARELTO) 20 MG TABS tablet Take 1 tablet (20 mg total) by mouth daily with supper. 30 tablet 6   No current facility-administered medications for this visit.    Hypertension ROS: taking medications as instructed, no medication side effects noted, no TIA's, no chest pain on exertion, no dyspnea on exertion and no swelling of ankles.  New concerns: head congestion and watery eyes for a couple of weeks. Feels like she has mucous in her throat.  Objective:  BP 161/90 mmHg  Pulse 79  Resp 98  Ht 5\' 3"  (1.6 m)  Wt 175 lb (79.379 kg)  BMI 31.01 kg/m2  SpO2 100%  Appearance alert, well appearing, and in no distress. General exam BP noted to be mildly elevated today in office, S1, S2 normal, no gallop, no murmur, chest clear, no JVD, no HSM, no edema.  Lab review: orders written for new lab studies as appropriate; see orders.     Assessment:   Hypertension poorly controlled, patient poorly compliant and needs better assitance from family.   [name] was seen today for hypertension.  Diagnoses and all orders for this visit:  Atrial fibrillation with slow ventricular response Orders: -     rivaroxaban (XARELTO) 20 MG TABS tablet; Take 1 tablet (20 mg total) by mouth daily with supper. Upon reviewing her medication fill log with the pharmacy and patients  medication bottles, it seems as if the patient has not been taking her medications daily. She reports that she was out of medication for 3 days but her bottles have 10 pills each remaining. According to her last fill date she should have been out of all her pills for the past 1 week. Family states that they fill her pill box and take the remaining pills to their home and return when the patients son calls and lets her know that her boxes need to be filled again.  I have explained to family in great detail the risk of patient missing anti-coagulant. I have asked if a home nurse would be helpful and I was told no. Patient looks and smells unkept. I will place order for home nursing anyway,  Essential hypertension Orders: -     Refill amLODipine (NORVASC) 10 MG tablet; Take 1 tablet (10 mg total) by mouth daily. -     Refill lisinopril (PRINIVIL,ZESTRIL) 40 MG tablet; Take 1 tablet (40 mg total) by mouth daily. I have advised family to make sure patient is taking medication daily, I have placed order for home health to assist.  Dyslipidemia Orders: -    refill atorvastatin (LIPITOR) 80 MG tablet; TAKE 1 TABLET BY MOUTH DAILY AT 6 PM.  Alzheimer's disease Orders: -     Ambulatory referral to Neurology Patient has been taking Nameda to help  slow progression of loss of cognitive function. I will send patient to Neurology for proper testing and additional recommendations.  Encounter for medication management See above   >50% of visit spent coordination with pharmacy, counting pills, and counseling family  Return in about 3 months (around 05/09/2015) for Hypertension.  Holland CommonsKECK, VALERIE, NP 02/20/2015 11:56 AM

## 2015-02-06 NOTE — Progress Notes (Signed)
Patient here to follow up on her HTN She has her medications with her and needs refills on all

## 2015-02-13 ENCOUNTER — Other Ambulatory Visit: Payer: Self-pay | Admitting: Internal Medicine

## 2015-02-16 ENCOUNTER — Ambulatory Visit: Payer: Medicare Other | Admitting: Neurology

## 2015-02-20 ENCOUNTER — Encounter: Payer: Self-pay | Admitting: Internal Medicine

## 2015-02-23 ENCOUNTER — Ambulatory Visit (INDEPENDENT_AMBULATORY_CARE_PROVIDER_SITE_OTHER): Payer: Medicare Other | Admitting: Neurology

## 2015-02-23 ENCOUNTER — Encounter: Payer: Self-pay | Admitting: Neurology

## 2015-02-23 VITALS — BP 148/97 | HR 83 | Ht 63.0 in | Wt 174.0 lb

## 2015-02-23 DIAGNOSIS — F0390 Unspecified dementia without behavioral disturbance: Secondary | ICD-10-CM | POA: Diagnosis not present

## 2015-02-23 MED ORDER — MEMANTINE HCL 10 MG PO TABS: 10.0000 mg | ORAL_TABLET | Freq: Two times a day (BID) | ORAL | Status: DC

## 2015-02-23 NOTE — Progress Notes (Signed)
PATIENT: [name] DOB: August 23, 1933  Chief Complaint  Patient presents with  . Memory Loss    MMSE 7/30 - 3 animals.  She is here with with her daugher-in-law, [name].  She has been having progressive memory problems and was started on donepezil in April 2016.      HISTORICAL  [name]  's 81 years old right-handed female, referred by her primary carepractitioner [name], accompanied by her daughter in law [name] for evaluation of memory trouble.  She has complicated past medical history, hypertension, atrial fibrillation, on chronic anticoagulation Xalreto, peripheral vascular disease, previous history of stroke, she lives with her son, she retired around age 82 used to work as a Investment banker, operational, she never Job in her life  She was noted to have gradual onset memory trouble, her family was not able to provide detailed history, apparently getting worse over the past few months, to the point of difficulty remembering how many children she has, sometimes could not recognize family members, occasionally bladder incontinence, mild gait difficulty due to joints pain.  We have reviewed MRI brain in April 2015, she has no acute infarction, remote stroke at left parietal lobe with encephalomalacia. small vessel disease type changes. Lab in Feb 2016, normal CBC, BMP  REVIEW OF SYSTEMS: Full 14 system review of systems performed and notable only for memory loss, ALLERGIES: No Known Allergies  HOME MEDICATIONS: Current Outpatient Prescriptions  Medication Sig Dispense Refill  . amLODipine (NORVASC) 10 MG tablet Take 1 tablet (10 mg total) by mouth daily. 30 tablet 4  . atorvastatin (LIPITOR) 80 MG tablet TAKE 1 TABLET BY MOUTH DAILY AT 6 PM. 30 tablet 3  . donepezil (ARICEPT) 5 MG tablet TAKE 1 TABLET BY MOUTH AT BEDTIME. 30 tablet 0  . lisinopril (PRINIVIL,ZESTRIL) 40 MG tablet Take 1 tablet (40 mg total) by mouth daily. 30 tablet 2  . rivaroxaban (XARELTO) 20 MG TABS tablet Take 1 tablet  (20 mg total) by mouth daily with supper. 30 tablet 6     PAST MEDICAL HISTORY: Past Medical History  Diagnosis Date  . HTN (hypertension)     a. Resistant HTN. Patient states that this has been poorly controlled for years.   . Diastolic HF (heart failure)     Echo (7/11): EF 60-65%, moderate LVH, severe diastolic dysfunction.   . Iron deficiency anemia   . Chronic atrial fibrillation     a. Chronic. Has had cardioembolic right renal infarct in 12/06 and cardioembolic event to the right arm requiring right brachial embolectomy. Both events occurred while subtherapeutic on coumadin. b. Slow VR 12/2013.  . Obese   . Cerebrovascular accident, embolic     left frontal  . Dyslipidemia   . CAD (coronary artery disease)     a. Possible dx -  Lexiscan myoview (9/11): EF 76%, normal wall motion, possible small anterior MI with mild peri-infarct ischemia but cannot rule out breast attenuation, managed conservatively.  Marland Kitchen PAD (peripheral artery disease)     a. peripheral arterial dopplers (5/12) with right mid popliteal artery occlusion with reconstitution of arteries below the knees by collaterals. Only mild reduction in ABI on the right (not tissue-threatening).  . Memory loss     PAST SURGICAL HISTORY: Past Surgical History  Procedure Laterality Date  . Embolectomy      right brachial embolectomy    FAMILY HISTORY: Family History  Problem Relation Age of Onset  . Anemia Father   . Hypertension Mother   .  Asthma Father     SOCIAL HISTORY:  History   Social History  . Marital Status: Widowed    Spouse Name: N/A  . Number of Children: 3  . Years of Education: HS   Occupational History  . Retired    Social History Main Topics  . Smoking status: Never Smoker   . Smokeless tobacco: Not on file  . Alcohol Use: No  . Drug Use: No  . Sexual Activity: Not on file   Other Topics Concern  . Not on file   Social History Narrative   Lives with her sons.   Right-handed.   No  caffeine use.     PHYSICAL EXAM   Filed Vitals:   02/23/15 0744  BP: 148/97  Pulse: 83  Height:  (1.6 m)  Weight: 174 lb (78.926 kg)    Not recorded      Body mass index is 30.83 kg/(m^2).  PHYSICAL EXAMNIATION:  Gen: NAD, conversant, well nourised, obese, well groomed                     Cardiovascular: Regular rate rhythm, no peripheral edema, warm, nontender. Eyes: Conjunctivae clear without exudates or hemorrhage Neck: Supple, no carotid bruise. Pulmonary: Clear to auscultation bilaterally   NEUROLOGICAL EXAM:  MENTAL STATUS: Speech/cognition: Mini-Mental Status Examination is 7 out of 30, animal naming 3, no dysarthria  CRANIAL NERVES: CN II: Visual fields are full to confrontation. Fundoscopic exam is normal with sharp discs. Pupils were 4 mm and briskly reactive to light.  CN III, IV, VI: extraocular movement are normal. No ptosis. CN V: Facial sensation is intact to pinprick in all 3 divisions bilaterally. Corneal responses are intact.  CN VII: Face is symmetric with normal eye closure and smile. CN VIII: Hearing is normal to rubbing fingers CN IX, X: Palate elevates symmetrically. Phonation is normal. CN XI: Head turning and shoulder shrug are intact CN XII: Tongue is midline with normal movements and no atrophy.  MOTOR: There is no pronator drift of out-stretched arms. Muscle bulk and tone are normal. Muscle strength is normal.  REFLEXES: Reflexes are 2+ and symmetric at the biceps, triceps, knees, and ankles. Plantar responses are flexor.  SENSORY: Light touch, pinprick, position sense, and vibration sense are intact in fingers and toes.  COORDINATION: Rapid alternating movements and fine finger movements are intact. There is no dysmetria on finger-to-nose and heel-knee-shin. There are no abnormal or extraneous movements.   GAIT/STANCE: Antalgic, cautious   DIAGNOSTIC DATA (LABS, IMAGING, TESTING) - I reviewed patient records, labs, notes,  testing and imaging myself where available.  Lab Results  Component Value Date   WBC 3.3* 11/03/2014   HGB 13.2 11/03/2014   HCT 39.4 11/03/2014   MCV 91.0 11/03/2014   PLT 170.0 11/03/2014      Component Value Date/Time   NA 141 11/03/2014 1634   K 4.3 11/03/2014 1634   CL 107 11/03/2014 1634   CO2 28 11/03/2014 1634   GLUCOSE 76 11/03/2014 1634   BUN 20 11/03/2014 1634   CREATININE 0.92 11/03/2014 1634   CALCIUM 9.5 11/03/2014 1634   PROT 6.8 12/29/2013 0444   ALBUMIN 3.0* 12/29/2013 0444   AST 17 12/29/2013 0444   ALT 11 12/29/2013 0444   ALKPHOS 82 12/29/2013 0444   BILITOT 0.6 12/29/2013 0444   GFRNONAA 50* 05/02/2014 1217   GFRAA 58* 05/02/2014 1217   Lab Results  Component Value Date   CHOL 126 11/03/2014  HDL 48.80 11/03/2014   LDLCALC 65 11/03/2014   LDLDIRECT 142.7 07/23/2010   TRIG 59.0 11/03/2014   CHOLHDL 3 11/03/2014   Lab Results  Component Value Date   HGBA1C 5.4 12/29/2013   Lab Results  Component Value Date   VITAMINB12 332 12/29/2013   Lab Results  Component Value Date   TSH 0.262* 12/30/2013      ASSESSMENT AND PLAN  Lebron QuamMary L Massoud is a 81 y.o. female with gradual onset memory trouble, today's Mini-Mental status examination is only 7 out of 30, consistent with dementia, likely a vascular component   1. Complete evaluation with MRI of the brain 2. Laboratory evaluations 3. Return to clinic in one month     Levert FeinsteinYijun Raynor Calcaterra, M.D. Ph.D.  [facility] [address], [address] [place], [place] [address] Ph: [phone] Fax: [phone]

## 2015-02-24 LAB — RPR: RPR: NONREACTIVE

## 2015-02-24 LAB — THYROID PANEL WITH TSH
FREE THYROXINE INDEX: 2.6 (ref 1.2–4.9)
T3 UPTAKE RATIO: 29 % (ref 24–39)
T4, Total: 8.8 ug/dL (ref 4.5–12.0)
TSH: 0.298 u[IU]/mL — ABNORMAL LOW (ref 0.450–4.500)

## 2015-02-24 LAB — FOLATE: Folate: >20 ng/mL (ref >3.0)

## 2015-02-24 LAB — VITAMIN B12: Vitamin B-12: 215 pg/mL (ref 211–946)

## 2015-02-27 ENCOUNTER — Encounter: Payer: Self-pay | Admitting: *Deleted

## 2015-02-27 ENCOUNTER — Telehealth: Payer: Self-pay | Admitting: Neurology

## 2015-02-27 NOTE — Telephone Encounter (Signed)
Spoke to [name] and her dgt, [name] - aware of results and will start the vitamin B12 supplement.

## 2015-02-27 NOTE — Telephone Encounter (Signed)
Patients son called and stated that the patient had just spoken with the nurse but she was having trouble remember what the nurse said. He requested that Marcelino DusterMichelle RN call him back and explain what their conversation was about. Please call and advise.

## 2015-02-27 NOTE — Telephone Encounter (Signed)
Spoke to her son - he is aware of his mother's lab results and the orders to start her vitamin B12 supplement.

## 2015-02-27 NOTE — Telephone Encounter (Signed)
Please call patient, mildly low B12, 215, she needs over-the-counter supplement, 1000 g daily.  Decreased the TSH, which has been chronic, she is not on thyroid supplement, we will repeat lab testing at her next follow-up visit, rest labs were normal

## 2015-04-05 ENCOUNTER — Encounter (HOSPITAL_COMMUNITY): Payer: Self-pay | Admitting: Emergency Medicine

## 2015-04-05 ENCOUNTER — Emergency Department (HOSPITAL_COMMUNITY)
Admission: EM | Admit: 2015-04-05 | Discharge: 2015-04-05 | Disposition: A | Discharge status: Home / Self Care | Payer: Medicare Other | Attending: Emergency Medicine | Admitting: Emergency Medicine

## 2015-04-05 DIAGNOSIS — Z7901 Long term (current) use of anticoagulants: Secondary | ICD-10-CM | POA: Diagnosis not present

## 2015-04-05 DIAGNOSIS — I5030 Unspecified diastolic (congestive) heart failure: Secondary | ICD-10-CM | POA: Diagnosis not present

## 2015-04-05 DIAGNOSIS — R519 Headache, unspecified: Secondary | ICD-10-CM

## 2015-04-05 DIAGNOSIS — E669 Obesity, unspecified: Secondary | ICD-10-CM | POA: Diagnosis not present

## 2015-04-05 DIAGNOSIS — Z8673 Personal history of transient ischemic attack (TIA), and cerebral infarction without residual deficits: Secondary | ICD-10-CM | POA: Diagnosis not present

## 2015-04-05 DIAGNOSIS — I10 Essential (primary) hypertension: Secondary | ICD-10-CM | POA: Diagnosis not present

## 2015-04-05 DIAGNOSIS — D509 Iron deficiency anemia, unspecified: Secondary | ICD-10-CM | POA: Diagnosis not present

## 2015-04-05 DIAGNOSIS — E785 Hyperlipidemia, unspecified: Secondary | ICD-10-CM | POA: Diagnosis not present

## 2015-04-05 DIAGNOSIS — J069 Acute upper respiratory infection, unspecified: Secondary | ICD-10-CM | POA: Diagnosis not present

## 2015-04-05 DIAGNOSIS — F0390 Unspecified dementia without behavioral disturbance: Secondary | ICD-10-CM | POA: Diagnosis not present

## 2015-04-05 DIAGNOSIS — I2510 Atherosclerotic heart disease of native coronary artery without angina pectoris: Secondary | ICD-10-CM | POA: Insufficient documentation

## 2015-04-05 DIAGNOSIS — Z79899 Other long term (current) drug therapy: Secondary | ICD-10-CM | POA: Diagnosis not present

## 2015-04-05 DIAGNOSIS — R51 Headache: Secondary | ICD-10-CM | POA: Diagnosis present

## 2015-04-05 LAB — CBC WITH DIFFERENTIAL/PLATELET
BASOS PCT: 1 % (ref 0–1)
Basophils Absolute: 0 10*3/uL (ref 0.0–0.1)
EOS PCT: 4 % (ref 0–5)
Eosinophils Absolute: 0.1 10*3/uL (ref 0.0–0.7)
HEMATOCRIT: 40.9 % (ref 36.0–46.0)
Hemoglobin: 13.5 g/dL (ref 12.0–15.0)
LYMPHS ABS: 1.4 10*3/uL (ref 0.7–4.0)
Lymphocytes Relative: 37 % (ref 12–46)
MCH: 30.3 pg (ref 26.0–34.0)
MCHC: 33 g/dL (ref 30.0–36.0)
MCV: 91.7 fL (ref 78.0–100.0)
MONO ABS: 0.4 10*3/uL (ref 0.1–1.0)
MONOS PCT: 9 % (ref 3–12)
NEUTROS PCT: 49 % (ref 43–77)
Neutro Abs: 1.9 10*3/uL (ref 1.7–7.7)
PLATELETS: 159 10*3/uL (ref 150–400)
RBC: 4.46 MIL/uL (ref 3.87–5.11)
RDW: 13.3 % (ref 11.5–15.5)
WBC: 3.7 10*3/uL — AB (ref 4.0–10.5)

## 2015-04-05 LAB — BASIC METABOLIC PANEL
ANION GAP: 9 (ref 5–15)
BUN: 15 mg/dL (ref 6–20)
CO2: 28 mmol/L (ref 22–32)
CREATININE: 1.01 mg/dL — AB (ref 0.44–1.00)
Calcium: 9.2 mg/dL (ref 8.9–10.3)
Chloride: 102 mmol/L (ref 101–111)
GFR calc non Af Amer: 51 mL/min — ABNORMAL LOW (ref >60)
GFR, EST AFRICAN AMERICAN: 59 mL/min — AB (ref >60)
Glucose, Bld: 95 mg/dL (ref 65–99)
POTASSIUM: 3.5 mmol/L (ref 3.5–5.1)
SODIUM: 139 mmol/L (ref 135–145)

## 2015-04-05 MED ORDER — SALINE SPRAY 0.65 % NA SOLN: 1.0000 | Freq: Three times a day (TID) | NASAL | Status: DC | PRN

## 2015-04-05 MED ORDER — ACETAMINOPHEN 325 MG PO TABS
325.0000 mg | ORAL_TABLET | Freq: Once | ORAL | Status: AC
  Administered 2015-04-05: 325 mg via ORAL
  Filled 2015-04-05: qty 1

## 2015-04-05 MED ORDER — SALINE SPRAY 0.65 % NA SOLN
1.0000 | Freq: Once | NASAL | Status: AC
  Administered 2015-04-05: 1 via NASAL
  Filled 2015-04-05: qty 44

## 2015-04-05 NOTE — ED Notes (Addendum)
Working on getting patient a ride home. Pt came by ambulance, secretary calling patient's son. Who has not been able to find ride home for her. Pt has dementia. Calling [facility] to transport home approval from CN.

## 2015-04-05 NOTE — ED Notes (Signed)
Pt arrives via [facility] from home. Per EMS patients brother called out stating that patient c/o of a headache. EMS reported patients brother was poor historian and could not give much information about what was going on with patient. Pt has hx of dementia, denied pain but rubbed her forehead when asked if anything hurt. pts brother Neita Goodnightlijah can be reached at [phone] if needed.

## 2015-04-05 NOTE — Discharge Instructions (Signed)
Sinus Headache  A sinus headache is when your sinuses become clogged or swollen. Sinus headaches can range from mild to severe.   CAUSES  A sinus headache can have different causes, such as:  · Colds.  · Sinus infections.  · Allergies.  SYMPTOMS   Symptoms of a sinus headache may vary and can include:  · Headache.  · Pain or pressure in the face.  · Congested or runny nose.  · Fever.  · Inability to smell.  · Pain in upper teeth.  Weather changes can make symptoms worse.  TREATMENT   The treatment of a sinus headache depends on the cause.  · Sinus pain caused by a sinus infection may be treated with antibiotic medicine.  · Sinus pain caused by allergies may be helped by allergy medicines (antihistamines) and medicated nasal sprays.  · Sinus pain caused by congestion may be helped by flushing the nose and sinuses with saline solution.  HOME CARE INSTRUCTIONS   · If antibiotics are prescribed, take them as directed. Finish them even if you start to feel better.  · Only take over-the-counter or prescription medicines for pain, discomfort, or fever as directed by your caregiver.  · If you have congestion, use a nasal spray to help reduce pressure.  SEEK IMMEDIATE MEDICAL CARE IF:  · You have a fever.  · You have headaches more than once a week.  · You have sensitivity to light or sound.  · You have repeated nausea and vomiting.  · You have vision problems.  · You have sudden, severe pain in your face or head.  · You have a seizure.  · You are confused.  · Your sinus headaches do not get better after treatment. Many people think they have a sinus headache when they actually have migraines or tension headaches.  MAKE SURE YOU:   · Understand these instructions.  · Will watch your condition.  · Will get help right away if you are not doing well or get worse.  Document Released: 10/17/2004 Document Revised: 12/02/2011 Document Reviewed: 12/08/2010  ExitCare® Patient Information ©2015 [employer]. This information is not  intended to replace advice given to you by your health care provider. Make sure you discuss any questions you have with your health care provider.  Upper Respiratory Infection, Adult  An upper respiratory infection (URI) is also sometimes known as the common cold. The upper respiratory tract includes the nose, sinuses, throat, trachea, and bronchi. Bronchi are the airways leading to the lungs. Most people improve within 1 week, but symptoms can last up to 2 weeks. A residual cough may last even longer.   CAUSES  Many different viruses can infect the tissues lining the upper respiratory tract. The tissues become irritated and inflamed and often become very moist. Mucus production is also common. A cold is contagious. You can easily spread the virus to others by oral contact. This includes kissing, sharing a glass, coughing, or sneezing. Touching your mouth or nose and then touching a surface, which is then touched by another person, can also spread the virus.  SYMPTOMS   Symptoms typically develop 1 to 3 days after you come in contact with a cold virus. Symptoms vary from person to person. They may include:  · Runny nose.  · Sneezing.  · Nasal congestion.  · Sinus irritation.  · Sore throat.  · Loss of voice (laryngitis).  · Cough.  · Fatigue.  · Muscle aches.  · Loss of appetite.  ·   Headache.  · Low-grade fever.  DIAGNOSIS   You might diagnose your own cold based on familiar symptoms, since most people get a cold 2 to 3 times a year. Your caregiver can confirm this based on your exam. Most importantly, your caregiver can check that your symptoms are not due to another disease such as strep throat, sinusitis, pneumonia, asthma, or epiglottitis. Blood tests, throat tests, and X-rays are not necessary to diagnose a common cold, but they may sometimes be helpful in excluding other more serious diseases. Your caregiver will decide if any further tests are required.  RISKS AND COMPLICATIONS   You may be at risk for a more  severe case of the common cold if you smoke cigarettes, have chronic heart disease (such as heart failure) or lung disease (such as asthma), or if you have a weakened immune system. The very young and very old are also at risk for more serious infections. Bacterial sinusitis, middle ear infections, and bacterial pneumonia can complicate the common cold. The common cold can worsen asthma and chronic obstructive pulmonary disease (COPD). Sometimes, these complications can require emergency medical care and may be life-threatening.  PREVENTION   The best way to protect against getting a cold is to practice good hygiene. Avoid oral or hand contact with people with cold symptoms. Wash your hands often if contact occurs. There is no clear evidence that vitamin C, vitamin E, echinacea, or exercise reduces the chance of developing a cold. However, it is always recommended to get plenty of rest and practice good nutrition.  TREATMENT   Treatment is directed at relieving symptoms. There is no cure. Antibiotics are not effective, because the infection is caused by a virus, not by bacteria. Treatment may include:  · Increased fluid intake. Sports drinks offer valuable electrolytes, sugars, and fluids.  · Breathing heated mist or steam (vaporizer or shower).  · Eating chicken soup or other clear broths, and maintaining good nutrition.  · Getting plenty of rest.  · Using gargles or lozenges for comfort.  · Controlling fevers with ibuprofen or acetaminophen as directed by your caregiver.  · Increasing usage of your inhaler if you have asthma.  Zinc gel and zinc lozenges, taken in the first 24 hours of the common cold, can shorten the duration and lessen the severity of symptoms. Pain medicines may help with fever, muscle aches, and throat pain. A variety of non-prescription medicines are available to treat congestion and runny nose. Your caregiver can make recommendations and may suggest nasal or lung inhalers for other symptoms.     HOME CARE INSTRUCTIONS   · Only take over-the-counter or prescription medicines for pain, discomfort, or fever as directed by your caregiver.  · Use a warm mist humidifier or inhale steam from a shower to increase air moisture. This may keep secretions moist and make it easier to breathe.  · Drink enough water and fluids to keep your urine clear or pale yellow.  · Rest as needed.  · Return to work when your temperature has returned to normal or as your caregiver advises. You may need to stay home longer to avoid infecting others. You can also use a face mask and careful hand washing to prevent spread of the virus.  SEEK MEDICAL CARE IF:   · After the first few days, you feel you are getting worse rather than better.  · You need your caregiver's advice about medicines to control symptoms.  · You develop chills, worsening shortness of breath,   or brown or red sputum. These may be signs of pneumonia.  · You develop yellow or brown nasal discharge or pain in the face, especially when you bend forward. These may be signs of sinusitis.  · You develop a fever, swollen neck glands, pain with swallowing, or white areas in the back of your throat. These may be signs of strep throat.  SEEK IMMEDIATE MEDICAL CARE IF:   · You have a fever.  · You develop severe or persistent headache, ear pain, sinus pain, or chest pain.  · You develop wheezing, a prolonged cough, cough up blood, or have a change in your usual mucus (if you have chronic lung disease).  · You develop sore muscles or a stiff neck.  Document Released: 03/05/2001 Document Revised: 12/02/2011 Document Reviewed: 12/15/2013  ExitCare® Patient Information ©2015 [employer]. This information is not intended to replace advice given to you by your health care provider. Make sure you discuss any questions you have with your health care provider.

## 2015-04-05 NOTE — ED Notes (Signed)
Called pt. Son about discharge. Son states he is arranging a ride for pt.

## 2015-04-05 NOTE — ED Provider Notes (Signed)
CSN: [id number]     Arrival date & time 04/05/15  0236 History  This chart was scribed for Marisa Severinlga Damyn Weitzel, MD by Evon Slackerrance Branch, ED Scribe. This patient was seen in room A08C/A08C and the patient's care was started at 3:13 AM.     Chief Complaint  Patient presents with  . Headache   The history is provided by the patient. No language interpreter was used.   HPI Comments: Level 5 caveat: Dementia Bonnie QuamMary L Stephens is a 81 y.o. female with PMHx of HTN, diastolic heart failure, chronic atrial fibrillation, CAD, PAD and memory loss. brought in by ambulance, who presents to the Emergency Department. Pt states that she is feeling nervous. She states that she also has a head cold described as congestion and pain. Pt also report that she is not complaint with taking her BP medication.    Past Medical History  Diagnosis Date  . HTN (hypertension)     a. Resistant HTN. Patient states that this has been poorly controlled for years.   . Diastolic HF (heart failure)     Echo (7/11): EF 60-65%, moderate LVH, severe diastolic dysfunction.   . Iron deficiency anemia   . Chronic atrial fibrillation     a. Chronic. Has had cardioembolic right renal infarct in 12/06 and cardioembolic event to the right arm requiring right brachial embolectomy. Both events occurred while subtherapeutic on coumadin. b. Slow VR 12/2013.  . Obese   . Cerebrovascular accident, embolic     left frontal  . Dyslipidemia   . CAD (coronary artery disease)     a. Possible dx -  Lexiscan myoview (9/11): EF 76%, normal wall motion, possible small anterior MI with mild peri-infarct ischemia but cannot rule out breast attenuation, managed conservatively.  Marland Kitchen. PAD (peripheral artery disease)     a. peripheral arterial dopplers (5/12) with right mid popliteal artery occlusion with reconstitution of arteries below the knees by collaterals. Only mild reduction in ABI on the right (not tissue-threatening).  . Memory loss    Past Surgical History   Procedure Laterality Date  . Embolectomy      right brachial embolectomy   Family History  Problem Relation Age of Onset  . Anemia Father   . Hypertension Mother   . Asthma Father    History  Substance Use Topics  . Smoking status: Never Smoker   . Smokeless tobacco: Not on file  . Alcohol Use: No   OB History    No data available     Review of Systems  Unable to perform ROS: Dementia      Allergies  Review of patient's allergies indicates no known allergies.  Home Medications   Prior to Admission medications   Medication Sig Start Date End Date Taking? Authorizing Provider  amLODipine (NORVASC) 10 MG tablet Take 1 tablet (10 mg total) by mouth daily. 02/06/15   Ambrose FinlandValerie A Keck, NP  atorvastatin (LIPITOR) 80 MG tablet TAKE 1 TABLET BY MOUTH DAILY AT 6 PM. 02/06/15   Ambrose FinlandValerie A Keck, NP  donepezil (ARICEPT) 5 MG tablet TAKE 1 TABLET BY MOUTH AT BEDTIME. 12/30/14   Ambrose FinlandValerie A Keck, NP  lisinopril (PRINIVIL,ZESTRIL) 40 MG tablet Take 1 tablet (40 mg total) by mouth daily. 02/06/15   Ambrose FinlandValerie A Keck, NP  memantine (NAMENDA) 10 MG tablet Take 1 tablet (10 mg total) by mouth 2 (two) times daily. 02/23/15   Levert FeinsteinYijun Yan, MD  rivaroxaban (XARELTO) 20 MG TABS tablet Take 1 tablet (20 mg total)  by mouth daily with supper. 02/06/15   [name], NP  vitamin B-12 (CYANOCOBALAMIN) 1000 MCG tablet Take 1,000 mcg by mouth daily.    Historical Provider, MD   BP 161/98 mmHg  Pulse 85  Temp(Src) 97.7 F (36.5 C) (Oral)  Resp 10  SpO2 100%   Physical Exam  Constitutional: She is oriented to person, place, and time. She appears well-developed and well-nourished. No distress.  HENT:  Head: Normocephalic and atraumatic.  Right Ear: External ear normal.  Left Ear: External ear normal.  Mouth/Throat: Oropharynx is clear and moist.  Nasal congestion  Eyes: Conjunctivae and EOM are normal. Pupils are equal, round, and reactive to light.  Neck: Normal range of motion. Neck supple. No JVD  present. No tracheal deviation present. No thyromegaly present.  Cardiovascular: Normal rate, regular rhythm, normal heart sounds and intact distal pulses.  Exam reveals no gallop and no friction rub.   No murmur heard. Pulmonary/Chest: Effort normal and breath sounds normal. No stridor. No respiratory distress. She has no wheezes. She has no rales. She exhibits no tenderness.  Abdominal: Soft. Bowel sounds are normal. She exhibits no distension and no mass. There is no tenderness. There is no rebound and no guarding.  Musculoskeletal: Normal range of motion. She exhibits no edema or tenderness.  Lymphadenopathy:    She has no cervical adenopathy.  Neurological: She is alert and oriented to person, place, and time. She displays normal reflexes. She exhibits normal muscle tone. Coordination normal.  Skin: Skin is warm and dry. No rash noted. No erythema. No pallor.  Psychiatric: She has a normal mood and affect. Her behavior is normal. Judgment and thought content normal.  Nursing note and vitals reviewed.   ED Course  Procedures (including critical care time) DIAGNOSTIC STUDIES: Oxygen Saturation is 100% on RA, normal by my interpretation.    COORDINATION OF CARE:   Labs Review Labs Reviewed  CBC WITH DIFFERENTIAL/PLATELET - Abnormal; Notable for the following:    WBC 3.7 (*)    All other components within normal limits  BASIC METABOLIC PANEL - Abnormal; Notable for the following:    Creatinine, Ser 1.01 (*)    GFR calc non Af Amer 51 (*)    GFR calc Af Amer 59 (*)    All other components within normal limits    Imaging Review No results found.   EKG Interpretation None      MDM   Final diagnoses:  URI (upper respiratory infection)  Sinus headache     I personally performed the services described in this documentation, which was scribed in my presence. The recorded information has been reviewed and is accurate.     Workup here unremarkable.  Pt feeling better.     Marisa Severin, MD 04/07/15 1346

## 2015-04-14 ENCOUNTER — Emergency Department (HOSPITAL_COMMUNITY): Payer: Medicare Other

## 2015-04-14 ENCOUNTER — Encounter (HOSPITAL_COMMUNITY): Payer: Self-pay | Admitting: Emergency Medicine

## 2015-04-14 ENCOUNTER — Emergency Department (HOSPITAL_COMMUNITY)
Admission: EM | Admit: 2015-04-14 | Discharge: 2015-04-14 | Disposition: A | Discharge status: Home / Self Care | Payer: Medicare Other | Attending: Emergency Medicine | Admitting: Emergency Medicine

## 2015-04-14 DIAGNOSIS — R1084 Generalized abdominal pain: Secondary | ICD-10-CM | POA: Insufficient documentation

## 2015-04-14 DIAGNOSIS — Z862 Personal history of diseases of the blood and blood-forming organs and certain disorders involving the immune mechanism: Secondary | ICD-10-CM | POA: Insufficient documentation

## 2015-04-14 DIAGNOSIS — Z79899 Other long term (current) drug therapy: Secondary | ICD-10-CM | POA: Diagnosis not present

## 2015-04-14 DIAGNOSIS — E669 Obesity, unspecified: Secondary | ICD-10-CM | POA: Diagnosis not present

## 2015-04-14 DIAGNOSIS — I509 Heart failure, unspecified: Secondary | ICD-10-CM | POA: Diagnosis not present

## 2015-04-14 DIAGNOSIS — Z7901 Long term (current) use of anticoagulants: Secondary | ICD-10-CM | POA: Diagnosis not present

## 2015-04-14 DIAGNOSIS — I10 Essential (primary) hypertension: Secondary | ICD-10-CM | POA: Diagnosis not present

## 2015-04-14 DIAGNOSIS — I2510 Atherosclerotic heart disease of native coronary artery without angina pectoris: Secondary | ICD-10-CM | POA: Diagnosis not present

## 2015-04-14 DIAGNOSIS — R109 Unspecified abdominal pain: Secondary | ICD-10-CM | POA: Diagnosis present

## 2015-04-14 DIAGNOSIS — I482 Chronic atrial fibrillation: Secondary | ICD-10-CM | POA: Insufficient documentation

## 2015-04-14 DIAGNOSIS — Z8673 Personal history of transient ischemic attack (TIA), and cerebral infarction without residual deficits: Secondary | ICD-10-CM | POA: Diagnosis not present

## 2015-04-14 DIAGNOSIS — E785 Hyperlipidemia, unspecified: Secondary | ICD-10-CM | POA: Insufficient documentation

## 2015-04-14 LAB — URINALYSIS, ROUTINE W REFLEX MICROSCOPIC
Bilirubin Urine: NEGATIVE
Glucose, UA: NEGATIVE mg/dL
Hgb urine dipstick: NEGATIVE
KETONES UR: NEGATIVE mg/dL
Leukocytes, UA: NEGATIVE
Nitrite: NEGATIVE
PROTEIN: 30 mg/dL — AB
SPECIFIC GRAVITY, URINE: 1.013 (ref 1.005–1.030)
Urobilinogen, UA: 1 mg/dL (ref 0.0–1.0)
pH: 7.5 (ref 5.0–8.0)

## 2015-04-14 LAB — BASIC METABOLIC PANEL
Anion gap: 6 (ref 5–15)
BUN: 19 mg/dL (ref 6–20)
CO2: 28 mmol/L (ref 22–32)
CREATININE: 0.96 mg/dL (ref 0.44–1.00)
Calcium: 9.4 mg/dL (ref 8.9–10.3)
Chloride: 107 mmol/L (ref 101–111)
GFR calc Af Amer: >60 mL/min (ref >60)
GFR calc non Af Amer: 54 mL/min — ABNORMAL LOW (ref >60)
Glucose, Bld: 105 mg/dL — ABNORMAL HIGH (ref 65–99)
Potassium: 4.3 mmol/L (ref 3.5–5.1)
SODIUM: 141 mmol/L (ref 135–145)

## 2015-04-14 LAB — CBC
HEMATOCRIT: 41.8 % (ref 36.0–46.0)
Hemoglobin: 13.8 g/dL (ref 12.0–15.0)
MCH: 30.7 pg (ref 26.0–34.0)
MCHC: 33 g/dL (ref 30.0–36.0)
MCV: 92.9 fL (ref 78.0–100.0)
Platelets: 161 10*3/uL (ref 150–400)
RBC: 4.5 MIL/uL (ref 3.87–5.11)
RDW: 13.5 % (ref 11.5–15.5)
WBC: 3.7 10*3/uL — AB (ref 4.0–10.5)

## 2015-04-14 LAB — URINE MICROSCOPIC-ADD ON

## 2015-04-14 LAB — TROPONIN I: Troponin I: <0.03 ng/mL (ref <0.031)

## 2015-04-14 NOTE — ED Notes (Signed)
Pt arrives via EMS from home with several days of not feeling well per family. Per EMs family caregivers were poor historians. Pt reported to EMS chest pain and abdominal pain. EKG unremarkable, afib rate 55f 60s-80s. Patient reports her "nerves have been acting up" to this RN. Family report patient has not been getting her medications for the last several days. Pt with hx of dementia. At baseline per caregivers. Pt alert, resting in bed, follows commands, oriented to person, place, disoriented to time. VSS.

## 2015-04-14 NOTE — Discharge Instructions (Signed)
Tests showed no worrisome findings. Follow-up your primary care doctor.

## 2015-04-14 NOTE — ED Provider Notes (Signed)
CSN: [id number]     Arrival date & time 04/14/15  4098 History   First MD Initiated Contact with Patient 04/14/15 1014     Chief Complaint  Patient presents with  . Abdominal Pain  . Anxiety     (Consider location/radiation/quality/duration/timing/severity/associated sxs/prior Treatment) HPI..... Level V caveat for dementia. Patient arrived via EMS from home.  Per EMS history, caregivers were poor historians and said she "did not feel well". She allegedly reports chest pain abdominal pain, along with general anxiety. Caregiver states she is at baseline. She is in poor health. No fever, sweats, chills, dysuria, dyspnea, nausea, vomiting, diarrhea.  Past Medical History  Diagnosis Date  . HTN (hypertension)     a. Resistant HTN. Patient states that this has been poorly controlled for years.   . Diastolic HF (heart failure)     Echo (7/11): EF 60-65%, moderate LVH, severe diastolic dysfunction.   . Iron deficiency anemia   . Chronic atrial fibrillation     a. Chronic. Has had cardioembolic right renal infarct in 12/06 and cardioembolic event to the right arm requiring right brachial embolectomy. Both events occurred while subtherapeutic on coumadin. b. Slow VR 12/2013.  . Obese   . Cerebrovascular accident, embolic     left frontal  . Dyslipidemia   . CAD (coronary artery disease)     a. Possible dx -  Lexiscan myoview (9/11): EF 76%, normal wall motion, possible small anterior MI with mild peri-infarct ischemia but cannot rule out breast attenuation, managed conservatively.  Marland Kitchen PAD (peripheral artery disease)     a. peripheral arterial dopplers (5/12) with right mid popliteal artery occlusion with reconstitution of arteries below the knees by collaterals. Only mild reduction in ABI on the right (not tissue-threatening).  . Memory loss    Past Surgical History  Procedure Laterality Date  . Embolectomy      right brachial embolectomy   Family History  Problem Relation Age of Onset  .  Anemia Father   . Hypertension Mother   . Asthma Father    History  Substance Use Topics  . Smoking status: Never Smoker   . Smokeless tobacco: Not on file  . Alcohol Use: No   OB History    No data available     Review of Systems  All other systems reviewed and are negative.     Allergies  Review of patient's allergies indicates no known allergies.  Home Medications   Prior to Admission medications   Medication Sig Start Date End Date Taking? Authorizing Provider  amLODipine (NORVASC) 10 MG tablet Take 1 tablet (10 mg total) by mouth daily. 02/06/15   [name], NP  atorvastatin (LIPITOR) 80 MG tablet TAKE 1 TABLET BY MOUTH DAILY AT 6 PM. 02/06/15   [name], NP  donepezil (ARICEPT) 5 MG tablet TAKE 1 TABLET BY MOUTH AT BEDTIME.*NEEDS APPOINTMENT FOR MORE REFILLS* 04/05/15   [name], NP  lisinopril (PRINIVIL,ZESTRIL) 40 MG tablet Take 1 tablet (40 mg total) by mouth daily. 02/06/15   [name], NP  memantine (NAMENDA) 10 MG tablet Take 1 tablet (10 mg total) by mouth 2 (two) times daily. 02/23/15   Levert Feinstein, MD  rivaroxaban (XARELTO) 20 MG TABS tablet Take 1 tablet (20 mg total) by mouth daily with supper. 02/06/15   [name], NP  sodium chloride (OCEAN) 0.65 % SOLN nasal spray Place 1 spray into both nostrils 3 (three) times daily as needed for congestion. 04/05/15  Marisa Severin, MD  vitamin B-12 (CYANOCOBALAMIN) 1000 MCG tablet Take 1,000 mcg by mouth daily.    Historical Provider, MD   BP 156/95 mmHg  Pulse 63  Temp(Src) 98.9 F (37.2 C) (Oral)  Resp 18  SpO2 97% Physical Exam  Constitutional:  Appears in no acute distress  HENT:  Head: Normocephalic and atraumatic.  Eyes: Conjunctivae and EOM are normal. Pupils are equal, round, and reactive to light.  Neck: Normal range of motion. Neck supple.  Cardiovascular: Normal rate and regular rhythm.   Pulmonary/Chest: Effort normal and breath sounds normal.  Abdominal: Soft. Bowel sounds are  normal.  Musculoskeletal: Normal range of motion.  Neurological: She is alert.  Skin: Skin is warm and dry.  Psychiatric:  Flat affect  Nursing note and vitals reviewed.   ED Course  Procedures (including critical care time) Labs Review Labs Reviewed  BASIC METABOLIC PANEL - Abnormal; Notable for the following:    Glucose, Bld 105 (*)    GFR calc non Af Amer 54 (*)    All other components within normal limits  CBC - Abnormal; Notable for the following:    WBC 3.7 (*)    All other components within normal limits  URINALYSIS, ROUTINE W REFLEX MICROSCOPIC (NOT AT [facility]) - Abnormal; Notable for the following:    Protein, ur 30 (*)    All other components within normal limits  TROPONIN I  URINE MICROSCOPIC-ADD ON    Imaging Review Dg Chest 2 View  04/14/2015   CLINICAL DATA:  Chest pain, anxiety  EXAM: CHEST  2 VIEW  COMPARISON:  12/28/2013, 05/07/2006  FINDINGS: There is elevation of the left diaphragm, unchanged from the prior exam. There is mild bilateral interstitial prominence, unchanged compared with multiple prior exams, likely chronic. There is no focal parenchymal opacity. There is no pleural effusion or pneumothorax. The heart mediastinum are stable.  There is mild osteoarthritis of the right glenohumeral joint.  IMPRESSION: No active cardiopulmonary disease.   Electronically Signed   By: Elige Ko   On: 04/14/2015 10:42     EKG Interpretation   Date/Time:  Friday April 14 2015 09:55:41 EDT Ventricular Rate:  85 PR Interval:    QRS Duration: 93 QT Interval:  390 QTC Calculation: 464 R Axis:   39 Text Interpretation:  Atrial fibrillation Borderline repolarization  abnormality Confirmed by Adysen Raphael  MD, Zaydyn Havey ([id number]) on 04/14/2015 1:07:36 PM      MDM   Final diagnoses:  Generalized abdominal pain   Patient appears in no acute distress. Screening labs, urinalysis, EKG, chest x-ray show no pathology.  Discharge home    Donnetta Hutching, MD 04/14/15 1308

## 2015-04-21 ENCOUNTER — Telehealth: Payer: Self-pay | Admitting: Internal Medicine

## 2015-04-21 NOTE — Telephone Encounter (Signed)
Ms [name] is calling to obtain verbal orders to continue her care by going back out and seeing the pt in august to asist with medicaid application and possible adult day center for assisted living options.  Please follow up with Child psychotherapist. Thank you.

## 2015-06-01 ENCOUNTER — Encounter: Payer: Self-pay | Admitting: Neurology

## 2015-06-01 ENCOUNTER — Ambulatory Visit (INDEPENDENT_AMBULATORY_CARE_PROVIDER_SITE_OTHER): Payer: Medicare Other | Admitting: Neurology

## 2015-06-01 VITALS — BP 168/99 | HR 95 | Ht 63.0 in | Wt 169.0 lb

## 2015-06-01 DIAGNOSIS — I2510 Atherosclerotic heart disease of native coronary artery without angina pectoris: Secondary | ICD-10-CM

## 2015-06-01 DIAGNOSIS — F0390 Unspecified dementia without behavioral disturbance: Secondary | ICD-10-CM | POA: Diagnosis not present

## 2015-06-01 MED ORDER — DONEPEZIL HCL 10 MG PO TABS: 10.0000 mg | ORAL_TABLET | Freq: Every day | ORAL | Status: DC

## 2015-06-01 NOTE — Progress Notes (Signed)
Chief Complaint  Patient presents with  . Memory Loss    MMSE 8/30 - 5 animals.  She is here with her daughter-in-law, [name].  Says they have not noticed any changes in her memory.  She has only been taking Namenda because her refills of Aricept are out.       PATIENT: [name] DOB: 1933-07-18  Chief Complaint  Patient presents with  . Memory Loss    MMSE 8/30 - 5 animals.  She is here with her daughter-in-law, [name].  Says they have not noticed any changes in her memory.  She has only been taking Namenda because her refills of Aricept are out.     HISTORICAL  [name]  's 81 years old right-handed female, referred by her primary carepractitioner Holland Commons, accompanied by her daughter in law [name] for evaluation of memory trouble.  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  She was noted to have gradual onset memory trouble, her family was not able to provide detailed history, apparently getting worse over the past few months, to the point of difficulty remembering how many children she has, sometimes could not recognize family members, occasionally bladder incontinence, mild gait difficulty due to joints pain.  We have reviewed MRI brain in April 2015, she has no acute infarction, remote stroke at left parietal lobe with encephalomalacia. small vessel disease type changes. Lab in Feb 2016, normal CBC, BMP  UPDATE Sep 8th 2016: She lives with her son, has home nurse 2 days of a week, she sleeps well, has good appetite,mild unsteady gait, she still take bus to visit her daughter sometimes, today's Mini-Mental status examinations only 8 out of 30   REVIEW OF SYSTEMS: Full 14 system review of systems performed and notable only for feeling cold, allergy, memory loss ALLERGIES: No Known  Allergies  HOME MEDICATIONS: Current Outpatient Prescriptions  Medication Sig Dispense Refill  . amLODipine (NORVASC) 10 MG tablet Take 1 tablet (10 mg total) by mouth daily. 30 tablet 4  . atorvastatin (LIPITOR) 80 MG tablet TAKE 1 TABLET BY MOUTH DAILY AT 6 PM. 30 tablet 3  . donepezil (ARICEPT) 5 MG tablet TAKE 1 TABLET BY MOUTH AT BEDTIME. 30 tablet 0  . lisinopril (PRINIVIL,ZESTRIL) 40 MG tablet Take 1 tablet (40 mg total) by mouth daily. 30 tablet 2  . rivaroxaban (XARELTO) 20 MG TABS tablet Take 1 tablet (20 mg total) by mouth daily with supper. 30 tablet 6     PAST MEDICAL HISTORY: Past Medical History  Diagnosis Date  . HTN (hypertension)     a. Resistant HTN. Patient states that this has been poorly controlled for years.   . Diastolic HF (heart failure)     Echo (7/11): EF 60-65%, moderate LVH, severe diastolic dysfunction.   . Iron deficiency anemia   . Chronic atrial fibrillation     a. Chronic. Has had cardioembolic right renal infarct in 12/06 and cardioembolic event to the right arm requiring right brachial embolectomy. Both events occurred while subtherapeutic on coumadin. b. Slow VR 12/2013.  . Obese   . Cerebrovascular accident, embolic     left frontal  . Dyslipidemia   . CAD (coronary artery disease)     a. Possible dx -  Lexiscan myoview (9/11): EF 76%, normal wall motion, possible small anterior MI with mild peri-infarct  ischemia but cannot rule out breast attenuation, managed conservatively.  Marland Kitchen PAD (peripheral artery disease)     a. peripheral arterial dopplers (5/12) with right mid popliteal artery occlusion with reconstitution of arteries below the knees by collaterals. Only mild reduction in ABI on the right (not tissue-threatening).  . Memory loss     PAST SURGICAL HISTORY: Past Surgical History  Procedure Laterality Date  . Embolectomy      right brachial embolectomy    FAMILY HISTORY: Family History  Problem Relation Age of Onset  . Anemia  Father   . Hypertension Mother   . Asthma Father     SOCIAL HISTORY:  Social History   Social History  . Marital Status: Widowed    Spouse Name: N/A  . Number of Children: 3  . Years of Education: HS   Occupational History  . Retired    Social History Main Topics  . Smoking status: Never Smoker   . Smokeless tobacco: Not on file  . Alcohol Use: No  . Drug Use: No  . Sexual Activity: Not on file   Other Topics Concern  . Not on file   Social History Narrative   Lives with her sons.   Right-handed.   No caffeine use.     PHYSICAL EXAM   Filed Vitals:   06/01/15 0847  BP: 168/99  Pulse: 95  Height: 5\' 3"  (1.6 m)  Weight: 169 lb (76.658 kg)    Not recorded      Body mass index is 29.94 kg/(m^2).   PHYSICAL EXAMNIATION:  Gen: NAD, conversant, well nourised, obese, well groomed                     Cardiovascular: Regular rate rhythm, no peripheral edema, warm, nontender. Eyes: Conjunctivae clear without exudates or hemorrhage Neck: Supple, no carotid bruise. Pulmonary: Clear to auscultation bilaterally   NEUROLOGICAL EXAM:  MENTAL STATUS: Speech:  Word finding difficulties, difficulty following three-step command, dentured speech Cognition: Mini-Mental Status Examination 8 out of 10, animal naming 5     She is not oriented  to time, place and person     Missed 3 out of 3 recalls     Normal Attention span and concentration     She has difficulty repeating, naming, could not write sentence or copy design  CRANIAL NERVES: CN II: Visual fields are full to confrontation. Pupils are round equal and briskly reactive to light. CN III, IV, VI: extraocular movement are normal. No ptosis. CN V: Facial sensation is intact to pinprick in all 3 divisions bilaterally. Corneal responses are intact.  CN VII: Face is symmetric with normal eye closure and smile. CN VIII: Hearing is normal to rubbing fingers CN IX, X: Palate elevates symmetrically. Phonation is  normal. CN XI: Head turning and shoulder shrug are intact CN XII: Tongue is midline with normal movements and no atrophy.  MOTOR: There is no pronator drift of out-stretched arms. Muscle bulk and tone are normal. Muscle strength is normal.  REFLEXES: Reflexes are 2+ and symmetric at the biceps, triceps, knees, and ankles. Plantar responses are flexor.  SENSORY: Not reliable  COORDINATION: Rapid alternating movements and fine finger movements are intact. There is no dysmetria on finger-to-nose and heel-knee-shin.    GAIT/STANCE: Cautious mildly unsteady  DIAGNOSTIC DATA (LABS, IMAGING, TESTING) - I reviewed patient records, labs, notes, testing and imaging myself where available.  ASSESSMENT AND PLAN  [name] is a 81 y.o. female with gradual  onset memory trouble, today's Mini-Mental status examination is only 8 out of 30  Dementia  There was evidence of old stroke on MRI, central nervous system degenerative disorder with vascular component  Keep Namenda 10 mg twice a day, Aricept 10 mg daily  Vitamin B12 supplement   Levert Feinstein, M.D. Ph.D.  [facility] [address] [place], [place] [address] Ph: [phone] Fax: [phone]

## 2015-06-12 ENCOUNTER — Telehealth: Payer: Self-pay | Admitting: *Deleted

## 2015-06-12 NOTE — Telephone Encounter (Signed)
May give order to extend.

## 2015-06-12 NOTE — Telephone Encounter (Signed)
RN Misty Stanley from [facility] asking for verbal order to extend home health services to monitor blood pressure and med management.  Verbal order given.

## 2015-06-14 ENCOUNTER — Other Ambulatory Visit: Payer: Self-pay | Admitting: Internal Medicine

## 2015-06-14 NOTE — Telephone Encounter (Signed)
Nurse called Gar Gibbon, social worker with [facility], reached voicemail. Left message for Gar Gibbon to call Heather with [facility], at [phone].

## 2015-06-15 ENCOUNTER — Encounter: Payer: Self-pay | Admitting: *Deleted

## 2015-06-15 ENCOUNTER — Telehealth: Payer: Self-pay | Admitting: Neurology

## 2015-06-15 NOTE — Telephone Encounter (Signed)
error 

## 2015-06-15 NOTE — Telephone Encounter (Signed)
Ok per vo by Dr. Terrace Arabia to refill donepezil and not the memantine - pharmacy aware.

## 2015-06-15 NOTE — Telephone Encounter (Signed)
[facility] [phone] called regarding Rx for memantine (NAMENDA) 10 MG tablet. Patient no longer has insurance and cannot afford this medication, they can fill donepezil (ARICEPT) 10 MG tablet for free for patient. Needs okay to fill Aricept.

## 2015-07-06 ENCOUNTER — Telehealth: Payer: Self-pay

## 2015-07-06 NOTE — Telephone Encounter (Signed)
Per Gar GibbonAnne Marie with [facility], Valerie Keck [facility] to [facility]. Gar Gibbonnne Marie has no needs for patients care at this time.

## 2015-07-06 NOTE — Telephone Encounter (Signed)
Nurse called Gar GibbonAnne Marie with [facility], reached voicemail. Left message for Gar Gibbonnne Marie to [facility] V[facility], at [phone].

## 2015-07-13 ENCOUNTER — Other Ambulatory Visit: Payer: Self-pay | Admitting: Internal Medicine

## 2015-07-17 ENCOUNTER — Emergency Department (HOSPITAL_COMMUNITY): Payer: Medicare Other

## 2015-07-17 ENCOUNTER — Emergency Department (HOSPITAL_COMMUNITY)
Admission: EM | Admit: 2015-07-17 | Discharge: 2015-07-18 | Disposition: A | Discharge status: Home / Self Care | Payer: Medicare Other | Attending: Emergency Medicine | Admitting: Emergency Medicine

## 2015-07-17 ENCOUNTER — Encounter (HOSPITAL_COMMUNITY): Payer: Self-pay | Admitting: *Deleted

## 2015-07-17 DIAGNOSIS — F0390 Unspecified dementia without behavioral disturbance: Secondary | ICD-10-CM | POA: Diagnosis not present

## 2015-07-17 DIAGNOSIS — E669 Obesity, unspecified: Secondary | ICD-10-CM | POA: Diagnosis not present

## 2015-07-17 DIAGNOSIS — Z79899 Other long term (current) drug therapy: Secondary | ICD-10-CM | POA: Diagnosis not present

## 2015-07-17 DIAGNOSIS — Z7901 Long term (current) use of anticoagulants: Secondary | ICD-10-CM | POA: Insufficient documentation

## 2015-07-17 DIAGNOSIS — Z862 Personal history of diseases of the blood and blood-forming organs and certain disorders involving the immune mechanism: Secondary | ICD-10-CM | POA: Insufficient documentation

## 2015-07-17 DIAGNOSIS — W19XXXA Unspecified fall, initial encounter: Secondary | ICD-10-CM

## 2015-07-17 DIAGNOSIS — Y9289 Other specified places as the place of occurrence of the external cause: Secondary | ICD-10-CM | POA: Diagnosis not present

## 2015-07-17 DIAGNOSIS — Y998 Other external cause status: Secondary | ICD-10-CM | POA: Insufficient documentation

## 2015-07-17 DIAGNOSIS — W1839XA Other fall on same level, initial encounter: Secondary | ICD-10-CM | POA: Diagnosis not present

## 2015-07-17 DIAGNOSIS — I2510 Atherosclerotic heart disease of native coronary artery without angina pectoris: Secondary | ICD-10-CM | POA: Insufficient documentation

## 2015-07-17 DIAGNOSIS — I5030 Unspecified diastolic (congestive) heart failure: Secondary | ICD-10-CM | POA: Diagnosis not present

## 2015-07-17 DIAGNOSIS — S63094A Other dislocation of right wrist and hand, initial encounter: Secondary | ICD-10-CM | POA: Diagnosis not present

## 2015-07-17 DIAGNOSIS — M25331 Other instability, right wrist: Secondary | ICD-10-CM

## 2015-07-17 DIAGNOSIS — Y9389 Activity, other specified: Secondary | ICD-10-CM | POA: Insufficient documentation

## 2015-07-17 DIAGNOSIS — S6991XA Unspecified injury of right wrist, hand and finger(s), initial encounter: Secondary | ICD-10-CM | POA: Diagnosis present

## 2015-07-17 DIAGNOSIS — E785 Hyperlipidemia, unspecified: Secondary | ICD-10-CM | POA: Insufficient documentation

## 2015-07-17 DIAGNOSIS — I10 Essential (primary) hypertension: Secondary | ICD-10-CM | POA: Diagnosis not present

## 2015-07-17 NOTE — ED Provider Notes (Signed)
CSN: [id number]     Arrival date & time 07/17/15  1753 History  By signing my name below, I, Freida Busman, attest that this documentation has been prepared under the direction and in the presence of Loren Racer, MD . Electronically Signed: Freida Busman, Scribe. 07/17/2015. 11:47 PM.  Chief Complaint  Patient presents with  . Fall   LEVEL 5 CAVEAT DUE TO Dementia  The history is provided by the patient and a relative (grandson). No language interpreter was used.    HPI Comments:  [name] is a 81 y.o. female with a history of dementia, who presents to the Emergency Department with grandson for swollen right wrist. Injury was unwitnessed. [name] thinks possibly occurred several days ago. Pt cannot recall fall; unsure of head injury. Pt notes associated pain to her right knee. No alleviating factors noted.   Past Medical History  Diagnosis Date  . HTN (hypertension)     a. Resistant HTN. Patient states that this has been poorly controlled for years.   . Diastolic HF (heart failure) (HCC)     Echo (7/11): EF 60-65%, moderate LVH, severe diastolic dysfunction.   . Iron deficiency anemia   . Chronic atrial fibrillation (HCC)     a. Chronic. Has had cardioembolic right renal infarct in 12/06 and cardioembolic event to the right arm requiring right brachial embolectomy. Both events occurred while subtherapeutic on coumadin. b. Slow VR 12/2013.  . Obese   . Cerebrovascular accident, embolic (HCC)     left frontal  . Dyslipidemia   . CAD (coronary artery disease)     a. Possible dx -  Lexiscan myoview (9/11): EF 76%, normal wall motion, possible small anterior MI with mild peri-infarct ischemia but cannot rule out breast attenuation, managed conservatively.  Marland Kitchen PAD (peripheral artery disease) (HCC)     a. peripheral arterial dopplers (5/12) with right mid popliteal artery occlusion with reconstitution of arteries below the knees by collaterals. Only mild reduction in ABI on the right  (not tissue-threatening).  . Memory loss    Past Surgical History  Procedure Laterality Date  . Embolectomy      right brachial embolectomy   Family History  Problem Relation Age of Onset  . Anemia Father   . Hypertension Mother   . Asthma Father    Social History  Substance Use Topics  . Smoking status: Never Smoker   . Smokeless tobacco: None  . Alcohol Use: No   OB History    No data available     Review of Systems  Unable to perform ROS: Dementia    Allergies  Review of patient's allergies indicates no known allergies.  Home Medications   Prior to Admission medications   Medication Sig Start Date End Date Taking? Authorizing Provider  amLODipine (NORVASC) 10 MG tablet Take 1 tablet (10 mg total) by mouth daily. 02/06/15   [name], NP  atorvastatin (LIPITOR) 80 MG tablet TAKE 1 TABLET BY MOUTH DAILY AT 6 PM. 02/06/15   [name], NP  donepezil (ARICEPT) 10 MG tablet Take 1 tablet (10 mg total) by mouth at bedtime. 06/01/15   Levert Feinstein, MD  lisinopril (PRINIVIL,ZESTRIL) 40 MG tablet Take 1 tablet (40 mg total) by mouth daily. 02/06/15   [name], NP  rivaroxaban (XARELTO) 20 MG TABS tablet Take 1 tablet (20 mg total) by mouth daily with supper. 02/06/15   [name], NP  sodium chloride (OCEAN) 0.65 % SOLN nasal spray Place  1 spray into both nostrils 3 (three) times daily as needed for congestion. 04/05/15   Marisa Severin, MD  vitamin B-12 (CYANOCOBALAMIN) 1000 MCG tablet Take 1,000 mcg by mouth daily.    Historical Provider, MD   BP 136/85 mmHg  Pulse 91  Temp(Src) 98.1 F (36.7 C) (Oral)  Resp 16  Wt 175 lb 3 oz (79.465 kg)  SpO2 98% Physical Exam  Constitutional: She appears well-developed and well-nourished. No distress.  HENT:  Head: Normocephalic and atraumatic.  Mouth/Throat: Oropharynx is clear and moist. No oropharyngeal exudate.  Eyes: EOM are normal. Pupils are equal, round, and reactive to light.  Neck: Normal range of motion. Neck  supple.  No posterior midline cervical tenderness to palpation.  Cardiovascular: Normal rate and regular rhythm.   Pulmonary/Chest: Effort normal and breath sounds normal. No respiratory distress. She has no wheezes. She has no rales. She exhibits no tenderness.  Abdominal: Soft. Bowel sounds are normal. She exhibits no distension and no mass. There is no tenderness. There is no rebound and no guarding.  Musculoskeletal: Normal range of motion. She exhibits edema and tenderness.  Patient with diffusely swollen right wrist. Diffusely tender to palpation.  Neurological: She is alert.  Moves all extremities without deficit. Sensation is grossly intact.  Skin: Skin is warm and dry. No rash noted. No erythema.  Psychiatric: She has a normal mood and affect. Her behavior is normal.  Nursing note and vitals reviewed.   ED Course  Procedures   DIAGNOSTIC STUDIES:  Oxygen Saturation is 98% on RA, normal by my interpretation.    COORDINATION OF CARE:  11:17 PM Will order imaging studies of the right wrist.   Labs Review Labs Reviewed - No data to display  Imaging Review Dg Wrist Complete Right  07/17/2015  CLINICAL DATA:  Fall from bed with right hand and wrist pain. EXAM: RIGHT WRIST - COMPLETE 3+ VIEW COMPARISON:  None. FINDINGS: There is extensive chondrocalcinosis along the ulnar aspect of the wrist joint. Cortical irregularity with probable spur involving the radial metaphysis region. Cannot exclude an old injury at this location. Small bone fragment near the radial styloid is likely chronic. Degenerative changes at the first carpometacarpal joint. Difficult to exclude a triquetrum avulsion injury due to the chronic changes at the wrist joint. Prominent bone fragment or calcification near the ulnar styloid. IMPRESSION: Extensive degenerative changes at the wrist joint with chondrocalcinosis. No definite acute fracture. If there is high concern for a fracture, recommend stabilization with  repeat imaging or further evaluation with CT. Electronically Signed   By: Richarda Overlie M.D.   On: 07/17/2015 19:19   Ct Head Wo Contrast  07/17/2015  CLINICAL DATA:  Fall, extreme neck pain and headache. History of hypertension, memory loss, atrial fibrillation, stroke. EXAM: CT HEAD WITHOUT CONTRAST CT CERVICAL SPINE WITHOUT CONTRAST TECHNIQUE: Multidetector CT imaging of the head and cervical spine was performed following the standard protocol without intravenous contrast. Multiplanar CT image reconstructions of the cervical spine were also generated. COMPARISON:  MRI of the brain December 31, 2013 FINDINGS: CT HEAD FINDINGS The ventricles and sulci are normal for age. No intraparenchymal hemorrhage, mass effect nor midline shift. Patchy supratentorial white matter hypodensities are within normal range for patient's age and though non-specific suggest sequelae of chronic small vessel ischemic disease. No acute large vascular territory infarcts. LEFT parietal encephalomalacia. Old small LEFT cerebellar infarcts better seen on prior MRI. No abnormal extra-axial fluid collections. Basal cisterns are patent. Moderate calcific atherosclerosis of  the carotid siphons. No skull fracture. Small LEFT frontal scalp hematoma without subcutaneous gas or radiopaque foreign bodies. The included ocular globes and orbital contents are non-suspicious. LEFT middle ear in mastoid effusion without air cell coalescence. Trace RIGHT mastoid effusion. CT CERVICAL SPINE FINDINGS Old LEFT C7 and T1 lamina fractures versus severe facet arthropathy with chronic fragmentation. No acute lumbar spine fracture. Straightened cervical lordosis. Grade 1 C7-T1 anterolisthesis without spondylolysis. C3-4 facets are fused on degenerative basis. Bulky ventral endplate spurring Z6-1 through C6-7 compatible with DISH multilevel severe neural foraminal narrowing. Severe atlantodental osteoarthrosis. Calcified pannus about the odontoid process compatible  with CPPD. No destructive bony lesions. No prevertebral soft tissue swelling. Thyromegaly, no discrete dominant nodule. IMPRESSION: CT HEAD: No acute intracranial process. Small LEFT frontal scalp hematoma. No skull fracture. Chronic changes including small LEFT MCA territory infarct. CT CERVICAL SPINE: No acute cervical spine fracture. Grade 1 C7-T1 anterolisthesis on degenerative basis. Old LEFT C7 and T1 lamina fractures versus severe facet arthropathy and chronic fragmentation. Electronically Signed   By: Awilda Metro M.D.   On: 07/17/2015 23:36   Ct Cervical Spine Wo Contrast  07/17/2015  CLINICAL DATA:  Fall, extreme neck pain and headache. History of hypertension, memory loss, atrial fibrillation, stroke. EXAM: CT HEAD WITHOUT CONTRAST CT CERVICAL SPINE WITHOUT CONTRAST TECHNIQUE: Multidetector CT imaging of the head and cervical spine was performed following the standard protocol without intravenous contrast. Multiplanar CT image reconstructions of the cervical spine were also generated. COMPARISON:  MRI of the brain December 31, 2013 FINDINGS: CT HEAD FINDINGS The ventricles and sulci are normal for age. No intraparenchymal hemorrhage, mass effect nor midline shift. Patchy supratentorial white matter hypodensities are within normal range for patient's age and though non-specific suggest sequelae of chronic small vessel ischemic disease. No acute large vascular territory infarcts. LEFT parietal encephalomalacia. Old small LEFT cerebellar infarcts better seen on prior MRI. No abnormal extra-axial fluid collections. Basal cisterns are patent. Moderate calcific atherosclerosis of the carotid siphons. No skull fracture. Small LEFT frontal scalp hematoma without subcutaneous gas or radiopaque foreign bodies. The included ocular globes and orbital contents are non-suspicious. LEFT middle ear in mastoid effusion without air cell coalescence. Trace RIGHT mastoid effusion. CT CERVICAL SPINE FINDINGS Old LEFT C7  and T1 lamina fractures versus severe facet arthropathy with chronic fragmentation. No acute lumbar spine fracture. Straightened cervical lordosis. Grade 1 C7-T1 anterolisthesis without spondylolysis. C3-4 facets are fused on degenerative basis. Bulky ventral endplate spurring W9-6 through C6-7 compatible with DISH multilevel severe neural foraminal narrowing. Severe atlantodental osteoarthrosis. Calcified pannus about the odontoid process compatible with CPPD. No destructive bony lesions. No prevertebral soft tissue swelling. Thyromegaly, no discrete dominant nodule. IMPRESSION: CT HEAD: No acute intracranial process. Small LEFT frontal scalp hematoma. No skull fracture. Chronic changes including small LEFT MCA territory infarct. CT CERVICAL SPINE: No acute cervical spine fracture. Grade 1 C7-T1 anterolisthesis on degenerative basis. Old LEFT C7 and T1 lamina fractures versus severe facet arthropathy and chronic fragmentation. Electronically Signed   By: Awilda Metro M.D.   On: 07/17/2015 23:36   Ct Wrist Right Wo Contrast  07/18/2015  CLINICAL DATA:  Status post fall, with right wrist pain and swelling. Initial encounter. EXAM: CT OF THE RIGHT WRIST WITHOUT CONTRAST TECHNIQUE: Multidetector CT imaging of the right wrist was performed according to the standard protocol. Multiplanar CT image reconstructions were also generated. COMPARISON:  Right wrist radiographs performed 07/17/2015 FINDINGS: There is no evidence of acute fracture or dislocation. There is  widening of the scapholunate distance to 6-7 mm, concerning for scapholunate dissociation. Given underlying calcification, this appears to be chronic in nature. Scattered calcification is noted along the volar aspect of the wrist, and calcification is seen at the triangular fibrocartilage. Mild degenerative change is noted at the first carpometacarpal joint, with joint space narrowing and a small associated degenerative fragment. The underlying flexor  and extensor tendons are grossly unremarkable in appearance. The vasculature is not well assessed without contrast. The carpal tunnel is grossly unremarkable. No definite soft tissue abnormalities are characterized. IMPRESSION: 1. No evidence of acute fracture or dislocation. 2. Widening of the scapholunate distance to 6-7 mm, reflecting scapholunate dissociation. Given underlying calcification, this appears to be chronic in nature. 3. Additional degenerative calcification noted along the volar aspect of the wrist. Calcification noted at the triangular fibrocartilage. Mild degenerative change at the first carpometacarpal joint. Electronically Signed   By: Roanna RaiderJeffery  Chang M.D.   On: 07/18/2015 01:04   Dg Knee Complete 4 Views Right  07/17/2015  CLINICAL DATA:  81 year old female with history of trauma from a fall from bed. Injury to the right knee. EXAM: RIGHT KNEE - COMPLETE 4+ VIEW COMPARISON:  No priors. FINDINGS: Four views of the right knee demonstrate no acute displaced fracture, subluxation or dislocation. There is severe tricompartmental joint space narrowing, subchondral sclerosis, subchondral cyst formation and osteophyte formation, compatible with advanced osteoarthritis. IMPRESSION: 1. No acute radiographic abnormality of the right knee. 2. Advanced tricompartmental osteoarthritis in the right knee. Electronically Signed   By: Trudie Reedaniel  Entrikin M.D.   On: 07/17/2015 19:13   Dg Hand Complete Right  07/17/2015  CLINICAL DATA:  Larey SeatFell from bed with right hand and wrist pain and swelling EXAM: RIGHT HAND - COMPLETE 3+ VIEW COMPARISON:  None. FINDINGS: Diffuse osteoarthritis of the interphalangeal joints. Mild degenerative change of the first carpal metacarpal joint. No fracture or dislocation involving the bones of the hand. There is calcification over the region of the ulnar styloid process. There may be a fracture of the distal radius, not completely visualized on this study. IMPRESSION: No evidence of  acute abnormality involving the right hand. Electronically Signed   By: Esperanza Heiraymond  Rubner M.D.   On: 07/17/2015 19:15   I have personally reviewed and evaluated these images as part of my medical decision-making.   EKG Interpretation None      MDM   Final diagnoses:  Fall  Scapho-lunate dissociation, right   I personally performed the services described in this documentation, which was scribed in my presence. The recorded information has been reviewed and is accurate.     Patient has scapholunate dissociation on CT of the right wrist. Per radiologist this may be chronic. Patient is placed in a Velcro splint. We'll have follow-up with hand. Return precautions given.  Loren Raceravid Henritta Mutz, MD 07/18/15 [phone]

## 2015-07-17 NOTE — ED Notes (Signed)
Pt was in the bed and then got hurt in the bed. Family states she fell out of the bed.  PT has dementia.  Pt has swelling and pain to right hand and wrist, pulse palpable and right knee pain.  Pt is HOH.

## 2015-07-17 NOTE — ED Notes (Signed)
Grandson reports that she feel out of the bed a couple of days ago.  Unknown if she hit her head, right hand and wrist swollen.  Patient with dementia difficulty getting information from her

## 2015-07-18 ENCOUNTER — Emergency Department (HOSPITAL_COMMUNITY): Payer: Medicare Other

## 2015-07-18 NOTE — Discharge Instructions (Signed)
Wrist Sprain °A wrist sprain is a stretch or tear in the strong, fibrous tissues (ligaments) that connect your wrist bones. The ligaments of your wrist may be easily sprained. There are three types of wrist sprains. °· Grade 1. The ligament is not stretched or torn, but the sprain causes pain. °· Grade 2. The ligament is stretched or partially torn. You may be able to move your wrist, but not very much. °· Grade 3. The ligament or muscle completely tears. You may find it difficult or extremely painful to move your wrist even a little. °CAUSES °Often, wrist sprains are a result of a fall or an injury. The force of the impact causes the fibers of your ligament to stretch too much or tear. Common causes of wrist sprains include: °· Overextending your wrist while catching a ball with your hands. °· Repetitive or strenuous extension or bending of your wrist. °· Landing on your hand during a fall. °RISK FACTORS °· Having previous wrist injuries. °· Playing contact sports, such as boxing or wrestling. °· Participating in activities in which falling is common. °· Having poor wrist strength and flexibility. °SIGNS AND SYMPTOMS °· Wrist pain. °· Wrist tenderness. °· Inflammation or bruising of the wrist area. °· Hearing a "pop" or feeling a tear at the time of the injury. °· Decreased wrist movement due to pain, stiffness, or weakness. °DIAGNOSIS °Your health care provider will examine your wrist. In some cases, an X-ray will be taken to make sure you did not break any bones. If your health care provider thinks that you tore a ligament, he or she may order an MRI of your wrist. °TREATMENT °Treatment involves resting and icing your wrist. You may also need to take pain medicines to help lessen pain and inflammation. Your health care provider may recommend keeping your wrist still (immobilized) with a splint to help your sprain heal. When the splint is no longer necessary, you may need to perform strengthening and stretching  exercises. These exercises help you to regain strength and full range of motion in your wrist. Surgery is not usually needed for wrist sprains unless the ligament completely tears. °HOME CARE INSTRUCTIONS °· Rest your wrist. Do not do things that cause pain. °· Wear your wrist splint as directed by your health care provider. °· Take medicines only as directed by your health care provider. °· To ease pain and swelling, apply ice to the injured area. °¨ Put ice in a plastic bag. °¨ Place a towel between your skin and the bag. °¨ Leave the ice on for 20 minutes, 2-3 times a day. °SEEK MEDICAL CARE IF: °· Your pain, discomfort, or swelling gets worse even with treatment. °· You feel sudden numbness in your hand. °  °This information is not intended to replace advice given to you by your health care provider. Make sure you discuss any questions you have with your health care provider. °  °Document Released: 05/13/2014 Document Reviewed: 05/13/2014 °Elsevier Interactive Patient Education ©2016 [employer] ° °

## 2015-07-18 NOTE — ED Notes (Signed)
Discharge instructions reviewed with grandson - voiced understanding

## 2015-07-18 NOTE — ED Notes (Signed)
Velcro splint to right wrist.

## 2015-09-19 ENCOUNTER — Other Ambulatory Visit: Payer: Self-pay | Admitting: Internal Medicine

## 2015-10-12 MED FILL — XARELTO 20 MG TABLET: 20 | 30 days supply | Qty: 30 | Fill #5

## 2015-10-13 ENCOUNTER — Ambulatory Visit: Payer: Medicare Other | Admitting: Internal Medicine

## 2015-10-19 ENCOUNTER — Other Ambulatory Visit: Payer: Self-pay | Admitting: Internal Medicine

## 2015-10-19 MED FILL — LISINOPRIL 40 MG TABLET: 40 | 30 days supply | Qty: 30 | Fill #0

## 2015-10-19 MED FILL — AMLODIPINE BESYLATE 10 MG T: 10 | 30 days supply | Qty: 30 | Fill #0

## 2015-10-19 MED FILL — DONEPEZIL HCL 10 MG TABLET: 10 | 30 days supply | Qty: 30 | Fill #3

## 2015-10-20 ENCOUNTER — Encounter: Payer: Self-pay | Admitting: Internal Medicine

## 2015-10-20 ENCOUNTER — Ambulatory Visit: Payer: Medicare Other | Attending: Internal Medicine | Admitting: Internal Medicine

## 2015-10-20 VITALS — BP 159/96 | HR 90 | Temp 98.0°F | Resp 16 | Ht 63.0 in | Wt 166.6 lb

## 2015-10-20 DIAGNOSIS — E785 Hyperlipidemia, unspecified: Secondary | ICD-10-CM | POA: Diagnosis not present

## 2015-10-20 DIAGNOSIS — I10 Essential (primary) hypertension: Secondary | ICD-10-CM | POA: Diagnosis not present

## 2015-10-20 DIAGNOSIS — F0390 Unspecified dementia without behavioral disturbance: Secondary | ICD-10-CM | POA: Diagnosis not present

## 2015-10-20 DIAGNOSIS — I4891 Unspecified atrial fibrillation: Secondary | ICD-10-CM | POA: Diagnosis not present

## 2015-10-20 MED ORDER — LISINOPRIL 40 MG PO TABS: 40.0000 mg | ORAL_TABLET | Freq: Every day | ORAL | Status: DC

## 2015-10-20 MED ORDER — ATORVASTATIN CALCIUM 80 MG PO TABS: ORAL_TABLET | ORAL | Status: DC

## 2015-10-20 MED ORDER — AMLODIPINE BESYLATE 10 MG PO TABS: ORAL_TABLET | ORAL | Status: DC

## 2015-10-20 MED ORDER — RIVAROXABAN 20 MG PO TABS: 20.0000 mg | ORAL_TABLET | Freq: Every day | ORAL | Status: DC

## 2015-10-20 NOTE — Progress Notes (Signed)
Patient ID: [name], female   DOB: November 17, 1932, 82 y.o.   MRN: [id number] Subjective:  [name] is a 82 y.o. female with hypertension. She has a past medical history of Atrial Fib, dementia , Hyperlipidemia. Patient is present with her grandson who helps to give history.  [name] states that patient is care for by her 2 sons who help to give her her medication daily. [name] states that he fixes her pill box on a weekly basis and makes sure her refills are up-to-date.  Patient currently has no complaints today.  Current Outpatient Prescriptions  Medication Sig Dispense Refill  . amLODipine (NORVASC) 10 MG tablet TAKE 1 TABLET BY MOUTH DAILY. 30 tablet 4  . donepezil (ARICEPT) 10 MG tablet Take 1 tablet (10 mg total) by mouth at bedtime. 30 tablet 11  . lisinopril (PRINIVIL,ZESTRIL) 40 MG tablet Take 1 tablet (40 mg total) by mouth daily. 30 tablet 4  . rivaroxaban (XARELTO) 20 MG TABS tablet Take 1 tablet (20 mg total) by mouth daily with supper. 30 tablet 6  . sodium chloride (OCEAN) 0.65 % SOLN nasal spray Place 1 spray into both nostrils 3 (three) times daily as needed for congestion. 104 mL 0  . atorvastatin (LIPITOR) 80 MG tablet TAKE 1 TABLET BY MOUTH DAILY AT 6 PM. 30 tablet 5  . lisinopril (PRINIVIL,ZESTRIL) 40 MG tablet TAKE 1 TABLET BY MOUTH ONCE DAILY 30 tablet 4  . vitamin B-12 (CYANOCOBALAMIN) 1000 MCG tablet Take 1,000 mcg by mouth daily.     No current facility-administered medications for this visit.    ROS: taking medications as instructed, no medication side effects noted, no TIA's, no chest pain on exertion, no dyspnea on exertion, no swelling of ankles, noting orthopnea and noting episodes of paroxysmal nocturnal dyspnea.  Other than what is stated in HPI, all other systems are negative.    Objective:  BP 159/96 mmHg  Pulse 90  Temp(Src) 98 F (36.7 C)  Resp 16  Ht  (1.6 m)  Wt 166 lb 9.6 oz (75.569 kg)  BMI 29.52 kg/m2  SpO2 100%  Appearance alert,  well appearing, and in no distress, oriented to person, place, and time and overweight. General exam BP noted to be well controlled today in office, atrial fibrillation, no gallop, no murmur, chest clear, no JVD, no HSM, no edema,  Patient disoriented due to dementia.  Lab review: labs are reviewed, up to date and normal.   Assessment:   [name] was seen today for follow-up.  Diagnoses and all orders for this visit:  Essential hypertension -     amLODipine (NORVASC) 10 MG tablet; TAKE 1 TABLET BY MOUTH DAILY. -     lisinopril (PRINIVIL,ZESTRIL) 40 MG tablet; Take 1 tablet (40 mg total) by mouth daily.  patient's blood pressure is slightly elevated today but has been normal on several other occasions. Will not make changes today.  Medications refilled  Atrial fibrillation with slow ventricular response (HCC) -     Refill rivaroxaban (XARELTO) 20 MG TABS tablet; Take 1 tablet (20 mg total) by mouth daily with supper.  HLD (hyperlipidemia) -      Refill atorvastatin (LIPITOR) 80 MG tablet; TAKE 1 TABLET BY MOUTH DAILY AT 6 PM. Education provided on proper lifestyle changes in order to lower cholesterol. Patient advised to maintain healthy weight and to keep total fat intake at 25-35% of total calories and carbohydrates 50-60% of total daily calories. Explained how high cholesterol places patient at  risk for heart disease. Patient placed on appropriate medication and repeat labs in 6 months   Dementia, without behavioral disturbance  continue follow-up with neurology    Plan:  Reviewed diet, exercise and weight control. Recommended sodium restriction..  Return in about 3 months (around 01/18/2016) for Hypertension.  [name], NP 10/27/2015 10:28 AM

## 2015-10-20 NOTE — Patient Instructions (Signed)
Make follow up appointment with Cardiology

## 2015-10-20 NOTE — Progress Notes (Signed)
Patient here for follow up on her HTN and cholesterol And in need of medication refills

## 2015-11-22 ENCOUNTER — Other Ambulatory Visit: Payer: Self-pay | Admitting: Internal Medicine

## 2015-11-22 MED FILL — DONEPEZIL HCL 10 MG TABLET: 10 | 30 days supply | Qty: 30 | Fill #4

## 2015-11-22 MED FILL — XARELTO 20 MG TABLET: 20 | 30 days supply | Qty: 30 | Fill #6

## 2015-11-22 MED FILL — AMLODIPINE BESYLATE 10 MG T: 10 | 30 days supply | Qty: 30 | Fill #0

## 2015-11-22 MED FILL — LISINOPRIL 40 MG TABLET: 40 | 30 days supply | Qty: 30 | Fill #1

## 2015-11-27 ENCOUNTER — Ambulatory Visit (INDEPENDENT_AMBULATORY_CARE_PROVIDER_SITE_OTHER): Payer: Medicare Other | Admitting: Nurse Practitioner

## 2015-11-27 ENCOUNTER — Encounter: Payer: Self-pay | Admitting: Nurse Practitioner

## 2015-11-27 VITALS — BP 136/82 | HR 78 | Resp 18 | Ht 63.0 in | Wt 170.8 lb

## 2015-11-27 DIAGNOSIS — F0391 Unspecified dementia with behavioral disturbance: Secondary | ICD-10-CM | POA: Diagnosis not present

## 2015-11-27 DIAGNOSIS — F03918 Unspecified dementia, unspecified severity, with other behavioral disturbance: Secondary | ICD-10-CM

## 2015-11-27 DIAGNOSIS — G9340 Encephalopathy, unspecified: Secondary | ICD-10-CM

## 2015-11-27 MED ORDER — DONEPEZIL HCL 10 MG PO TABS: 10.0000 mg | ORAL_TABLET | Freq: Every day | ORAL | Status: DC

## 2015-11-27 NOTE — Patient Instructions (Signed)
Continue Aricept at current dose will refill Create a safe environment, remove locks on bathroom  Doors Reduced confusion, keep familiar objects and people around, stick to a routine Use effective communication such as simple words and short sentences Reduce nighttime restlessness, a consistent nighttime routine,  avoid napping during the day Encourage good nutrition and hydration F/U in 6 months

## 2015-11-27 NOTE — Progress Notes (Signed)
I have reviewed and agreed above plan. 

## 2015-11-27 NOTE — Progress Notes (Signed)
[facility]  PATIENT: [name] DOB: [birth year]   REASON FOR VISIT: Follow-up for memory loss HISTORY FROM: Patient and son [name]    HISTORY OF PRESENT ILLNESS: HISTORY [name] 's 82 years old right-handed female, referred by her primary carepractitioner Holland Commons, accompanied by her daughter in law Darleen for evaluation of memory trouble. 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 She was noted to have gradual onset memory trouble, her family was not able to provide detailed history, apparently getting worse over the past few months, to the point of difficulty remembering how many children she has, sometimes could not recognize family members, occasionally bladder incontinence, mild gait difficulty due to joints pain. We have reviewed MRI brain in April 2015, she has no acute infarction, remote stroke at left parietal lobe with encephalomalacia. small vessel disease type changes. Lab in Feb 2016, normal CBC, BMP UPDATE Sep 8th 2016:YY She lives with her son, has home nurse 2 days of a week, she sleeps well, has good appetite,mild unsteady gait, she still take bus to visit her daughter sometimes, today's Mini-Mental status examinations only 8 out of 80  UPDATE 3/6/17CM [name], 82 year old female returns for follow-up with her son. She has a history of memory loss and when last seen by Dr. Terrace Arabia she was on Namenda and Aricept however, the patient's pharmacy called and said the patient does not have any insurance and they would only fill the Aricept for free and the patient cannot afford the Namenda. There have been no safety issues identified per the son [name]. Appetite is good. She is unable to perform MMSE, last was 8 out of 30. She returns for reevaluation. She is  dependent for activities of daily living except feeding. She has not had any recent falls.    REVIEW OF SYSTEMS: Full 14 system review of systems performed and notable only for those listed, all others are neg:  Constitutional: neg  Cardiovascular: neg Ear/Nose/Throat: Hearing loss Skin: neg Eyes: neg Respiratory: neg Gastroitestinal: neg  Hematology/Lymphatic: neg  Endocrine: neg Musculoskeletal: Joint pain Allergy/Immunology: neg Neurological: Memory loss Psychiatric: neg Sleep : neg   ALLERGIES: No Known Allergies  HOME MEDICATIONS: Outpatient Prescriptions Prior to Visit  Medication Sig Dispense Refill  . amLODipine (NORVASC) 10 MG tablet TAKE 1 TABLET BY MOUTH DAILY. 30 tablet 4  . atorvastatin (LIPITOR) 80 MG tablet TAKE 1 TABLET BY MOUTH DAILY AT 6 PM. 30 tablet 5  . donepezil (ARICEPT) 10 MG tablet Take 1 tablet (10 mg total) by mouth at bedtime. 30 tablet 11  . lisinopril (PRINIVIL,ZESTRIL) 40 MG tablet TAKE 1 TABLET BY MOUTH ONCE DAILY 30 tablet 4  . rivaroxaban (XARELTO) 20 MG TABS tablet Take 1 tablet (20 mg total) by mouth daily with supper. 30 tablet 6  . sodium chloride (OCEAN) 0.65 % SOLN nasal spray Place 1 spray into both nostrils 3 (three) times daily as needed for congestion. (Patient not taking: Reported on 11/27/2015) 104 mL 0  . vitamin B-12 (CYANOCOBALAMIN) 1000 MCG tablet Take 1,000 mcg by mouth daily. Reported on 11/27/2015    . lisinopril (PRINIVIL,ZESTRIL) 40 MG tablet Take 1 tablet (40 mg total) by mouth daily. 30 tablet 4   No facility-administered medications prior to visit.    PAST MEDICAL HISTORY: Past Medical History  Diagnosis Date  .  HTN (hypertension)     a. Resistant HTN. Patient states that this has been poorly controlled for years.   . Diastolic HF (heart failure) (HCC)     Echo (7/11): EF 60-65%, moderate LVH, severe diastolic dysfunction.   . Iron deficiency anemia   . Chronic atrial fibrillation (HCC)     a. Chronic. Has had  cardioembolic right renal infarct in 12/06 and cardioembolic event to the right arm requiring right brachial embolectomy. Both events occurred while subtherapeutic on coumadin. b. Slow VR 12/2013.  . Obese   . Cerebrovascular accident, embolic (HCC)     left frontal  . Dyslipidemia   . CAD (coronary artery disease)     a. Possible dx -  Lexiscan myoview (9/11): EF 76%, normal wall motion, possible small anterior MI with mild peri-infarct ischemia but cannot rule out breast attenuation, managed conservatively.  Marland Kitchen. PAD (peripheral artery disease) (HCC)     a. peripheral arterial dopplers (5/12) with right mid popliteal artery occlusion with reconstitution of arteries below the knees by collaterals. Only mild reduction in ABI on the right (not tissue-threatening).  . Memory loss     PAST SURGICAL HISTORY: Past Surgical History  Procedure Laterality Date  . Embolectomy      right brachial embolectomy    FAMILY HISTORY: Family History  Problem Relation Age of Onset  . Anemia Father   . Hypertension Mother   . Asthma Father     SOCIAL HISTORY: Social History   Social History  . Marital Status: Widowed    Spouse Name: N/A  . Number of Children: 3  . Years of Education: HS   Occupational History  . Retired    Social History Main Topics  . Smoking status: Never Smoker   . Smokeless tobacco: Not on file  . Alcohol Use: No  . Drug Use: No  . Sexual Activity: Not on file   Other Topics Concern  . Not on file   Social History Narrative   Lives with her sons.   Right-handed.   No caffeine use.     PHYSICAL EXAM  Filed Vitals:   11/27/15 0811  BP: 136/82  Pulse: 78  Resp: 18  Height: 5\' 3"  (1.6 m)  Weight: 170 lb 12.8 oz (77.474 kg)   Body mass index is 30.26 kg/(m^2). Gen: NAD, conversant, well nourised, obese, well groomed  Cardiovascular: Regular rate rhythm,  Neck: Supple, no carotid bruit.  NEUROLOGICAL EXAM: MENTAL STATUS: Speech:  Word finding difficulties, difficulty following three-step command, dentured speech Cognition: Mini-Mental Status Examination unable to perform Last 8 out of 30, animal naming 2 She is not oriented to time, place and person, Missed 3 out of 3 recalls. Normal Attention span and concentration. She has difficulty repeating, naming, could not write sentence or copy design  CRANIAL NERVES: CN II: Visual fields are full to confrontation. Pupils are round equal and briskly reactive to light. CN III, IV, VI: extraocular movement are normal. No ptosis. CN V: Facial sensation is intact to pinprick in all 3 divisions bilaterally. .  CN VII: Face is symmetric with normal eye closure and smile. CN VIII: Hearing is normal to rubbing fingers CN IX, X: Palate elevates symmetrically. Phonation is normal. CN XI: Head turning and shoulder shrug are intact CN XII: Tongue is midline with normal movements and no atrophy.  MOTOR:There is no pronator drift of out-stretched arms. Muscle bulk and tone are normal. Muscle strength is normal. REFLEXES:Reflexes are 2+ and symmetric at the  biceps, triceps, knees, and ankles. Plantar responses are flexor. SENSORY:Not reliable COORDINATION: Unable to perform  GAIT/STANCE:Cautious mildly unsteady gait 30 feet in the hall, no assistive device   DIAGNOSTIC DATA (LABS, IMAGING, TESTING) - ASSESSMENT AND PLAN  82 y.o. year old female  has a past medical history of HTN (hypertension); Diastolic HF (heart failure) (HCC); Iron deficiency anemia; Chronic atrial fibrillation (HCC); Obese; Cerebrovascular accident, embolic (HCC); Dyslipidemia; CAD (coronary artery disease); PAD (peripheral artery disease) (HCC); and Memory loss. here to follow up.   Continue Aricept at current dose will refill, Insurance will not pay for [employer] a safe environment, remove locks on bathroom  doors Reduce confusion, keep familiar objects and people around, stick to a routine Use effective  communication such as simple words and short sentences Reduce nighttime restlessness, a consistent nighttime routine,  avoid napping during the day Encourage good nutrition and hydration Additional 10 minutes answering questions for son F/U in 6 monthsVst time 30 min Nilda Riggs, [facility], [facility], APRN  [facility] [address], [address] [place], [place] [address] [phone]

## 2015-12-23 ENCOUNTER — Other Ambulatory Visit: Payer: Self-pay | Admitting: Internal Medicine

## 2015-12-28 ENCOUNTER — Other Ambulatory Visit: Payer: Self-pay | Admitting: Internal Medicine

## 2015-12-29 MED FILL — XARELTO 20 MG TABLET: 20 | 30 days supply | Qty: 30 | Fill #0

## 2015-12-29 MED FILL — DONEPEZIL HCL 10 MG TABLET: 10 | 30 days supply | Qty: 30 | Fill #5

## 2015-12-29 MED FILL — AMLODIPINE BESYLATE 10 MG T: 10 | 30 days supply | Qty: 30 | Fill #1

## 2015-12-29 MED FILL — LISINOPRIL 40 MG TABLET: 40 | 30 days supply | Qty: 30 | Fill #2

## 2016-01-30 MED FILL — AMLODIPINE BESYLATE 10 MG T: 10 | 30 days supply | Qty: 30 | Fill #2

## 2016-01-30 MED FILL — XARELTO 20 MG TABLET: 20 | 30 days supply | Qty: 30 | Fill #1

## 2016-01-30 MED FILL — LISINOPRIL 40 MG TABLET: 40 | 30 days supply | Qty: 30 | Fill #3

## 2016-01-30 MED FILL — DONEPEZIL HCL 10 MG TABLET: 10 | 30 days supply | Qty: 30 | Fill #6

## 2016-02-13 ENCOUNTER — Ambulatory Visit: Payer: Medicare Other | Admitting: Cardiology

## 2016-02-28 ENCOUNTER — Encounter: Payer: Self-pay | Admitting: Cardiology

## 2016-03-01 MED FILL — DONEPEZIL HCL 10 MG TABLET: 10 | 30 days supply | Qty: 30 | Fill #7

## 2016-03-01 MED FILL — ?AMLODIPINE BESYLATE 10 MG: 10 | 30 days supply | Qty: 30 | Fill #3

## 2016-03-01 MED FILL — LISINOPRIL 40 MG TABLET: 40 | 30 days supply | Qty: 30 | Fill #4

## 2016-03-01 MED FILL — XARELTO 20 MG TABLET: 20 | 30 days supply | Qty: 30 | Fill #2

## 2016-03-07 ENCOUNTER — Encounter: Payer: Self-pay | Admitting: Cardiology

## 2016-03-13 ENCOUNTER — Encounter: Payer: Self-pay | Admitting: Cardiology

## 2016-04-02 MED FILL — XARELTO 20 MG TABLET: 20 | 30 days supply | Qty: 30 | Fill #3

## 2016-04-02 MED FILL — AMLODIPINE BESYLATE 10 MG T: 10 | 30 days supply | Qty: 30 | Fill #4

## 2016-04-02 MED FILL — DONEPEZIL HCL 10 MG TABLET: 10 | 30 days supply | Qty: 30 | Fill #8

## 2016-04-02 MED FILL — LISINOPRIL 40 MG TABLET: 40 | 30 days supply | Qty: 30 | Fill #5

## 2016-05-21 ENCOUNTER — Other Ambulatory Visit: Payer: Self-pay | Admitting: Internal Medicine

## 2016-05-21 DIAGNOSIS — I10 Essential (primary) hypertension: Secondary | ICD-10-CM

## 2016-05-28 MED FILL — DONEPEZIL HCL 10 MG TABLET: 10 | 30 days supply | Qty: 30 | Fill #9

## 2016-05-28 MED FILL — ?LISINOPRIL 40 MG TABLET: 40 MG | 30 days supply | Qty: 30 | Fill #0

## 2016-05-28 MED FILL — XARELTO 20 MG TABLET: 20 | 30 days supply | Qty: 30 | Fill #4

## 2016-05-28 MED FILL — AMLODIPINE BESYLATE 10 MG T: 10 | 30 days supply | Qty: 30 | Fill #0

## 2016-05-29 ENCOUNTER — Ambulatory Visit: Payer: Medicare Other | Admitting: Nurse Practitioner

## 2016-05-30 ENCOUNTER — Encounter: Payer: Self-pay | Admitting: Nurse Practitioner

## 2016-07-04 ENCOUNTER — Ambulatory Visit: Payer: Medicare Other | Admitting: Internal Medicine

## 2016-07-15 ENCOUNTER — Other Ambulatory Visit: Payer: Self-pay | Admitting: Internal Medicine

## 2016-07-15 DIAGNOSIS — I10 Essential (primary) hypertension: Secondary | ICD-10-CM

## 2016-07-15 MED FILL — AMLODIPINE BESYLATE 10 MG T: 10 | 30 days supply | Qty: 30 | Fill #0

## 2016-07-15 MED FILL — ?LISINOPRIL 40 MG TABLET: 40 MG | 30 days supply | Qty: 30 | Fill #1

## 2016-07-15 MED FILL — DONEPEZIL HCL 10 MG TABLET: 10 | 30 days supply | Qty: 30 | Fill #0

## 2016-07-15 MED FILL — XARELTO 20 MG TABLET: 20 | 30 days supply | Qty: 30 | Fill #5

## 2016-07-29 ENCOUNTER — Telehealth: Payer: Self-pay

## 2016-07-29 ENCOUNTER — Encounter: Payer: Self-pay | Admitting: Internal Medicine

## 2016-07-29 ENCOUNTER — Ambulatory Visit: Payer: Medicare Other | Attending: Internal Medicine | Admitting: Internal Medicine

## 2016-07-29 VITALS — BP 160/116 | HR 108 | Temp 97.7°F | Resp 16 | Wt 173.0 lb

## 2016-07-29 DIAGNOSIS — N390 Urinary tract infection, site not specified: Secondary | ICD-10-CM | POA: Diagnosis not present

## 2016-07-29 DIAGNOSIS — I10 Essential (primary) hypertension: Secondary | ICD-10-CM | POA: Diagnosis not present

## 2016-07-29 DIAGNOSIS — I482 Chronic atrial fibrillation: Secondary | ICD-10-CM | POA: Insufficient documentation

## 2016-07-29 DIAGNOSIS — F0391 Unspecified dementia with behavioral disturbance: Secondary | ICD-10-CM | POA: Diagnosis not present

## 2016-07-29 DIAGNOSIS — E785 Hyperlipidemia, unspecified: Secondary | ICD-10-CM | POA: Insufficient documentation

## 2016-07-29 DIAGNOSIS — I2510 Atherosclerotic heart disease of native coronary artery without angina pectoris: Secondary | ICD-10-CM | POA: Insufficient documentation

## 2016-07-29 DIAGNOSIS — Z7901 Long term (current) use of anticoagulants: Secondary | ICD-10-CM | POA: Insufficient documentation

## 2016-07-29 DIAGNOSIS — E669 Obesity, unspecified: Secondary | ICD-10-CM | POA: Diagnosis not present

## 2016-07-29 DIAGNOSIS — D509 Iron deficiency anemia, unspecified: Secondary | ICD-10-CM | POA: Diagnosis not present

## 2016-07-29 DIAGNOSIS — I5032 Chronic diastolic (congestive) heart failure: Secondary | ICD-10-CM | POA: Diagnosis present

## 2016-07-29 DIAGNOSIS — I110 Hypertensive heart disease with heart failure: Secondary | ICD-10-CM | POA: Insufficient documentation

## 2016-07-29 DIAGNOSIS — Z131 Encounter for screening for diabetes mellitus: Secondary | ICD-10-CM | POA: Diagnosis not present

## 2016-07-29 DIAGNOSIS — Z8673 Personal history of transient ischemic attack (TIA), and cerebral infarction without residual deficits: Secondary | ICD-10-CM | POA: Diagnosis not present

## 2016-07-29 DIAGNOSIS — Z79899 Other long term (current) drug therapy: Secondary | ICD-10-CM | POA: Diagnosis not present

## 2016-07-29 DIAGNOSIS — I4891 Unspecified atrial fibrillation: Secondary | ICD-10-CM

## 2016-07-29 DIAGNOSIS — R109 Unspecified abdominal pain: Secondary | ICD-10-CM | POA: Diagnosis not present

## 2016-07-29 DIAGNOSIS — Z23 Encounter for immunization: Secondary | ICD-10-CM | POA: Diagnosis not present

## 2016-07-29 LAB — CBC WITH DIFFERENTIAL/PLATELET
BASOS PCT: 0 %
Basophils Absolute: 0 cells/uL (ref 0–200)
EOS ABS: 111 {cells}/uL (ref 15–500)
Eosinophils Relative: 3 %
HCT: 38 % (ref 35.0–45.0)
Hemoglobin: 12.5 g/dL (ref 11.7–15.5)
Lymphocytes Relative: 30 %
Lymphs Abs: 1110 cells/uL (ref 850–3900)
MCH: 30 pg (ref 27.0–33.0)
MCHC: 32.9 g/dL (ref 32.0–36.0)
MCV: 91.1 fL (ref 80.0–100.0)
MONO ABS: 370 {cells}/uL (ref 200–950)
MONOS PCT: 10 %
MPV: 10.6 fL (ref 7.5–12.5)
NEUTROS ABS: 2109 {cells}/uL (ref 1500–7800)
Neutrophils Relative %: 57 %
PLATELETS: 215 10*3/uL (ref 140–400)
RBC: 4.17 MIL/uL (ref 3.80–5.10)
RDW: 13.1 % (ref 11.0–15.0)
WBC: 3.7 10*3/uL — ABNORMAL LOW (ref 3.8–10.8)

## 2016-07-29 LAB — POCT URINALYSIS DIPSTICK
Glucose, UA: NEGATIVE
Ketones, UA: NEGATIVE
NITRITE UA: NEGATIVE
PH UA: 6
PROTEIN UA: 100
Spec Grav, UA: 1.02
UROBILINOGEN UA: 1

## 2016-07-29 LAB — CMP AND LIVER
ALT: 9 U/L (ref 6–29)
AST: 19 U/L (ref 10–35)
Albumin: 3.8 g/dL (ref 3.6–5.1)
Alkaline Phosphatase: 75 U/L (ref 33–130)
BILIRUBIN INDIRECT: 0.6 mg/dL (ref 0.2–1.2)
BILIRUBIN TOTAL: 0.8 mg/dL (ref 0.2–1.2)
BUN: 24 mg/dL (ref 7–25)
Bilirubin, Direct: 0.2 mg/dL (ref <=0.2)
CHLORIDE: 103 mmol/L (ref 98–110)
CO2: 27 mmol/L (ref 20–31)
Calcium: 9.9 mg/dL (ref 8.6–10.4)
Creat: 1.07 mg/dL — ABNORMAL HIGH (ref 0.60–0.88)
Glucose, Bld: 82 mg/dL (ref 65–99)
Potassium: 3.9 mmol/L (ref 3.5–5.3)
Sodium: 141 mmol/L (ref 135–146)
Total Protein: 8.2 g/dL — ABNORMAL HIGH (ref 6.1–8.1)

## 2016-07-29 LAB — LIPID PANEL
CHOLESTEROL: 214 mg/dL — AB (ref <200)
HDL: 62 mg/dL (ref >50)
LDL Cholesterol: 138 mg/dL — ABNORMAL HIGH
TRIGLYCERIDES: 68 mg/dL (ref <150)
Total CHOL/HDL Ratio: 3.5 Ratio (ref <5.0)
VLDL: 14 mg/dL (ref <30)

## 2016-07-29 MED ORDER — AMLODIPINE BESYLATE 10 MG PO TABS: 10.0000 mg | ORAL_TABLET | Freq: Every day | ORAL | 3 refills | Status: DC

## 2016-07-29 MED ORDER — NITROFURANTOIN MONOHYD MACRO 100 MG PO CAPS: 100.0000 mg | ORAL_CAPSULE | Freq: Two times a day (BID) | ORAL | 0 refills | Status: DC

## 2016-07-29 MED ORDER — RIVAROXABAN 20 MG PO TABS: 20.0000 mg | ORAL_TABLET | Freq: Every day | ORAL | 6 refills | Status: DC

## 2016-07-29 MED ORDER — ATORVASTATIN CALCIUM 80 MG PO TABS: ORAL_TABLET | ORAL | 5 refills | Status: AC

## 2016-07-29 MED ORDER — LISINOPRIL 40 MG PO TABS
40.0000 mg | ORAL_TABLET | Freq: Every day | ORAL | 4 refills | Status: DC
Start: 2016-07-29 — End: 2017-04-23

## 2016-07-29 MED ORDER — DONEPEZIL HCL 10 MG PO TABS: 10.0000 mg | ORAL_TABLET | Freq: Every day | ORAL | 11 refills | Status: AC

## 2016-07-29 NOTE — Patient Instructions (Addendum)
Low-Sodium Eating Plan Sodium raises blood pressure and causes water to be held in the body. Getting less sodium from food will help lower your blood pressure, reduce any swelling, and protect your heart, liver, and kidneys. We get sodium by adding salt (sodium chloride) to food. Most of our sodium comes from canned, boxed, and frozen foods. Restaurant foods, fast foods, and pizza are also very high in sodium. Even if you take medicine to lower your blood pressure or to reduce fluid in your body, getting less sodium from your food is important. WHAT IS MY PLAN? Most people should limit their sodium intake to 2,300 mg a day. Your health care provider recommends that you limit your sodium intake to  2,000 mg_ a day.  WHAT DO I NEED TO KNOW ABOUT THIS EATING PLAN? For the low-sodium eating plan, you will follow these general guidelines:  Choose foods with a % Daily Value for sodium of less than 5% (as listed on the food label).   Use salt-free seasonings or herbs instead of table salt or sea salt.   Check with your health care provider or pharmacist before using salt substitutes.   Eat fresh foods.  Eat more vegetables and fruits.  Limit canned vegetables. If you do use them, rinse them well to decrease the sodium.   Limit cheese to 1 oz (28 g) per day.   Eat lower-sodium products, often labeled as "lower sodium" or "no salt added."  Avoid foods that contain monosodium glutamate (MSG). MSG is sometimes added to [place] food and some canned foods.  Check food labels (Nutrition Facts labels) on foods to learn how much sodium is in one serving.  Eat more home-cooked food and less restaurant, buffet, and fast food.  When eating at a restaurant, ask that your food be prepared with less salt, or no salt if possible.  HOW DO I READ FOOD LABELS FOR SODIUM INFORMATION? The Nutrition Facts label lists the amount of sodium in one serving of the food. If you eat more than one serving, you  must multiply the listed amount of sodium by the number of servings. Food labels may also identify foods as:  Sodium free--Less than 5 mg in a serving.  Very low sodium--35 mg or less in a serving.  Low sodium--140 mg or less in a serving.  Light in sodium--50% less sodium in a serving. For example, if a food that usually has 300 mg of sodium is changed to become light in sodium, it will have 150 mg of sodium.  Reduced sodium--25% less sodium in a serving. For example, if a food that usually has 400 mg of sodium is changed to reduced sodium, it will have 300 mg of sodium. WHAT FOODS CAN I EAT? Grains Low-sodium cereals, including oats, puffed wheat and rice, and shredded wheat cereals. Low-sodium crackers. Unsalted rice and pasta. Lower-sodium bread.  Vegetables Frozen or fresh vegetables. Low-sodium or reduced-sodium canned vegetables. Low-sodium or reduced-sodium tomato sauce and paste. Low-sodium or reduced-sodium tomato and vegetable juices.  Fruits Fresh, frozen, and canned fruit. Fruit juice.  Meat and Other Protein Products Low-sodium canned tuna and salmon. Fresh or frozen meat, poultry, seafood, and fish. Lamb. Unsalted nuts. Dried beans, peas, and lentils without added salt. Unsalted canned beans. Homemade soups without salt. Eggs.  Dairy Milk. Soy milk. Ricotta cheese. Low-sodium or reduced-sodium cheeses. Yogurt.  Condiments Fresh and dried herbs and spices. Salt-free seasonings. Onion and garlic powders. Low-sodium varieties of mustard and ketchup. Fresh or refrigerated  horseradish. Lemon juice.  Fats and Oils Reduced-sodium salad dressings. Unsalted butter.  Other Unsalted popcorn and pretzels.  The items listed above may not be a complete list of recommended foods or beverages. Contact your dietitian for more options. WHAT FOODS ARE NOT RECOMMENDED? Grains Instant hot cereals. Bread stuffing, pancake, and biscuit mixes. Croutons. Seasoned rice or pasta mixes.  Noodle soup cups. Boxed or frozen macaroni and cheese. Self-rising flour. Regular salted crackers. Vegetables Regular canned vegetables. Regular canned tomato sauce and paste. Regular tomato and vegetable juices. Frozen vegetables in sauces. Salted [place] fries. Olives. Rosita Fire. Relishes. Sauerkraut. Salsa. Meat and Other Protein Products Salted, canned, smoked, spiced, or pickled meats, seafood, or fish. Bacon, ham, sausage, hot dogs, corned beef, chipped beef, and packaged luncheon meats. Salt pork. Jerky. Pickled herring. Anchovies, regular canned tuna, and sardines. Salted nuts. Dairy Processed cheese and cheese spreads. Cheese curds. Blue cheese and cottage cheese. Buttermilk.  Condiments Onion and garlic salt, seasoned salt, table salt, and sea salt. Canned and packaged gravies. Worcestershire sauce. Tartar sauce. Barbecue sauce. Teriyaki sauce. Soy sauce, including reduced sodium. Steak sauce. Fish sauce. Oyster sauce. Cocktail sauce. Horseradish that you find on the shelf. Regular ketchup and mustard. Meat flavorings and tenderizers. Bouillon cubes. Hot sauce. Tabasco sauce. Marinades. Taco seasonings. Relishes. Fats and Oils Regular salad dressings. Salted butter. Margarine. Ghee. Bacon fat.  Other Potato and tortilla chips. Corn chips and puffs. Salted popcorn and pretzels. Canned or dried soups. Pizza. Frozen entrees and pot pies.  The items listed above may not be a complete list of foods and beverages to avoid. Contact your dietitian for more information.   This information is not intended to replace advice given to you by your health care provider. Make sure you discuss any questions you have with your health care provider.   Document Released: 03/01/2002 Document Revised: 09/30/2014 Document Reviewed: 07/14/2013 Elsevier Interactive Patient Education 2016 Elsevier Inc.   - DASH Eating Plan DASH stands for "Dietary Approaches to Stop Hypertension." The DASH eating plan  is a healthy eating plan that has been shown to reduce high blood pressure (hypertension). Additional health benefits may include reducing the risk of type 2 diabetes mellitus, heart disease, and stroke. The DASH eating plan may also help with weight loss. WHAT DO I NEED TO KNOW ABOUT THE DASH EATING PLAN? For the DASH eating plan, you will follow these general guidelines:  Choose foods with a percent daily value for sodium of less than 5% (as listed on the food label).  Use salt-free seasonings or herbs instead of table salt or sea salt.  Check with your health care provider or pharmacist before using salt substitutes.  Eat lower-sodium products, often labeled as "lower sodium" or "no salt added."  Eat fresh foods.  Eat more vegetables, fruits, and low-fat dairy products.  Choose whole grains. Look for the word "whole" as the first word in the ingredient list.  Choose fish and skinless chicken or [place] more often than red meat. Limit fish, poultry, and meat to 6 oz (170 g) each day.  Limit sweets, desserts, sugars, and sugary drinks.  Choose heart-healthy fats.  Limit cheese to 1 oz (28 g) per day.  Eat more home-cooked food and less restaurant, buffet, and fast food.  Limit fried foods.  Cook foods using methods other than frying.  Limit canned vegetables. If you do use them, rinse them well to decrease the sodium.  When eating at a restaurant, ask that your food be prepared  with less salt, or no salt if possible. WHAT FOODS CAN I EAT? Seek help from a dietitian for individual calorie needs. Grains Whole grain or whole wheat bread. Brown rice. Whole grain or whole wheat pasta. Quinoa, bulgur, and whole grain cereals. Low-sodium cereals. Corn or whole wheat flour tortillas. Whole grain cornbread. Whole grain crackers. Low-sodium crackers. Vegetables Fresh or frozen vegetables (raw, steamed, roasted, or grilled). Low-sodium or reduced-sodium tomato and vegetable juices.  Low-sodium or reduced-sodium tomato sauce and paste. Low-sodium or reduced-sodium canned vegetables.  Fruits All fresh, canned (in natural juice), or frozen fruits. Meat and Other Protein Products Ground beef (85% or leaner), grass-fed beef, or beef trimmed of fat. Skinless chicken or [place]. Ground chicken or [place]. Pork trimmed of fat. All fish and seafood. Eggs. Dried beans, peas, or lentils. Unsalted nuts and seeds. Unsalted canned beans. Dairy Low-fat dairy products, such as skim or 1% milk, 2% or reduced-fat cheeses, low-fat ricotta or cottage cheese, or plain low-fat yogurt. Low-sodium or reduced-sodium cheeses. Fats and Oils Tub margarines without trans fats. Light or reduced-fat mayonnaise and salad dressings (reduced sodium). Avocado. Safflower, olive, or canola oils. Natural peanut or almond butter. Other Unsalted popcorn and pretzels. The items listed above may not be a complete list of recommended foods or beverages. Contact your dietitian for more options. WHAT FOODS ARE NOT RECOMMENDED? Grains White bread. White pasta. White rice. Refined cornbread. Bagels and croissants. Crackers that contain trans fat. Vegetables Creamed or fried vegetables. Vegetables in a cheese sauce. Regular canned vegetables. Regular canned tomato sauce and paste. Regular tomato and vegetable juices. Fruits Dried fruits. Canned fruit in light or heavy syrup. Fruit juice. Meat and Other Protein Products Fatty cuts of meat. Ribs, chicken wings, bacon, sausage, bologna, salami, chitterlings, fatback, hot dogs, bratwurst, and packaged luncheon meats. Salted nuts and seeds. Canned beans with salt. Dairy Whole or 2% milk, cream, half-and-half, and cream cheese. Whole-fat or sweetened yogurt. Full-fat cheeses or blue cheese. Nondairy creamers and whipped toppings. Processed cheese, cheese spreads, or cheese curds. Condiments Onion and garlic salt, seasoned salt, table salt, and sea salt. Canned and packaged  gravies. Worcestershire sauce. Tartar sauce. Barbecue sauce. Teriyaki sauce. Soy sauce, including reduced sodium. Steak sauce. Fish sauce. Oyster sauce. Cocktail sauce. Horseradish. Ketchup and mustard. Meat flavorings and tenderizers. Bouillon cubes. Hot sauce. Tabasco sauce. Marinades. Taco seasonings. Relishes. Fats and Oils Butter, stick margarine, lard, shortening, ghee, and bacon fat. Coconut, palm kernel, or palm oils. Regular salad dressings. Other Pickles and olives. Salted popcorn and pretzels. The items listed above may not be a complete list of foods and beverages to avoid. Contact your dietitian for more information. WHERE CAN I FIND MORE INFORMATION? National Heart, Lung, and Blood Institute: CablePromo.it   This information is not intended to replace advice given to you by your health care provider. Make sure you discuss any questions you have with your health care provider.   Document Released: 08/29/2011 Document Revised: 09/30/2014 Document Reviewed: 07/14/2013 Elsevier Interactive Patient Education 2016 [employer].   -  Tdap Vaccine (Tetanus, Diphtheria and Pertussis): What You Need to Know 1. Why get vaccinated? Tetanus, diphtheria and pertussis are very serious diseases. Tdap vaccine can protect [place] from these diseases. And, Tdap vaccine given to pregnant women can protect newborn babies against pertussis. TETANUS (Lockjaw) is rare in the [place] today. It causes painful muscle tightening and stiffness, usually all over the body.  It can lead to tightening of muscles in the head and neck  so you can't open your mouth, swallow, or sometimes even breathe. Tetanus kills about 1 out of 10 people who are infected even after receiving the best medical care. DIPHTHERIA is also rare in the [place] today. It can cause a thick coating to form in the back of the throat.  It can lead to breathing problems, heart failure, paralysis,  and death. PERTUSSIS (Whooping Cough) causes severe coughing spells, which can cause difficulty breathing, vomiting and disturbed sleep.  It can also lead to weight loss, incontinence, and rib fractures. Up to 2 in 100 adolescents and 5 in 100 adults with pertussis are hospitalized or have complications, which could include pneumonia or death. These diseases are caused by bacteria. Diphtheria and pertussis are spread from person to person through secretions from coughing or sneezing. Tetanus enters the body through cuts, scratches, or wounds. Before vaccines, as many as 200,000 cases of diphtheria, 200,000 cases of pertussis, and hundreds of cases of tetanus, were reported in the [place] each year. Since vaccination began, reports of cases for tetanus and diphtheria have dropped by about 99% and for pertussis by about 80%. 2. Tdap vaccine Tdap vaccine can protect adolescents and adults from tetanus, diphtheria, and pertussis. One dose of Tdap is routinely given at age 27 or 54. People who did not get Tdap at that age should get it as soon as possible. Tdap is especially important for healthcare professionals and anyone having close contact with a baby younger than 12 months. Pregnant women should get a dose of Tdap during every pregnancy, to protect the newborn from pertussis. Infants are most at risk for severe, life-threatening complications from pertussis. Another vaccine, called Td, protects against tetanus and diphtheria, but not pertussis. A Td booster should be given every 10 years. Tdap may be given as one of these boosters if you have never gotten Tdap before. Tdap may also be given after a severe cut or burn to prevent tetanus infection. Your doctor or the person giving you the vaccine can give you more information. Tdap may safely be given at the same time as other vaccines. 3. Some people should not get this vaccine  A person who has ever had a life-threatening allergic reaction  after a previous dose of any diphtheria, tetanus or pertussis containing vaccine, OR has a severe allergy to any part of this vaccine, should not get Tdap vaccine. Tell the person giving the vaccine about any severe allergies.  Anyone who had coma or long repeated seizures within 7 days after a childhood dose of DTP or DTaP, or a previous dose of Tdap, should not get Tdap, unless a cause other than the vaccine was found. They can still get Td.  Talk to your doctor if you:  have seizures or another nervous system problem,  had severe pain or swelling after any vaccine containing diphtheria, tetanus or pertussis,  ever had a condition called Guillain-Barr Syndrome (GBS),  aren't feeling well on the day the shot is scheduled. 4. Risks With any medicine, including vaccines, there is a chance of side effects. These are usually mild and go away on their own. Serious reactions are also possible but are rare. Most people who get Tdap vaccine do not have any problems with it. Mild problems following Tdap (Did not interfere with activities)  Pain where the shot was given (about 3 in 4 adolescents or 2 in 3 adults)  Redness or swelling where the shot was given (about 1 person in 5)  Mild fever of at least 100.99F (up to about 1 in 25 adolescents or 1 in 100 adults)  Headache (about 3 or 4 people in 10)  Tiredness (about 1 person in 3 or 4)  Nausea, vomiting, diarrhea, stomach ache (up to 1 in 4 adolescents or 1 in 10 adults)  Chills, sore joints (about 1 person in 10)  Body aches (about 1 person in 3 or 4)  Rash, swollen glands (uncommon) Moderate problems following Tdap (Interfered with activities, but did not require medical attention)  Pain where the shot was given (up to 1 in 5 or 6)  Redness or swelling where the shot was given (up to about 1 in 16 adolescents or 1 in 12 adults)  Fever over 102F (about 1 in 100 adolescents or 1 in 250 adults)  Headache (about 1 in 7  adolescents or 1 in 10 adults)  Nausea, vomiting, diarrhea, stomach ache (up to 1 or 3 people in 100)  Swelling of the entire arm where the shot was given (up to about 1 in 500). Severe problems following Tdap (Unable to perform usual activities; required medical attention)  Swelling, severe pain, bleeding and redness in the arm where the shot was given (rare). Problems that could happen after any vaccine:  People sometimes faint after a medical procedure, including vaccination. Sitting or lying down for about 15 minutes can help prevent fainting, and injuries caused by a fall. Tell your doctor if you feel dizzy, or have vision changes or ringing in the ears.  Some people get severe pain in the shoulder and have difficulty moving the arm where a shot was given. This happens very rarely.  Any medication can cause a severe allergic reaction. Such reactions from a vaccine are very rare, estimated at fewer than 1 in a million doses, and would happen within a few minutes to a few hours after the vaccination. As with any medicine, there is a very remote chance of a vaccine causing a serious injury or death. The safety of vaccines is always being monitored. For more information, visit: http://floyd.org/ 5. What if there is a serious problem? What should I look for?  Look for anything that concerns you, such as signs of a severe allergic reaction, very high fever, or unusual behavior.  Signs of a severe allergic reaction can include hives, swelling of the face and throat, difficulty breathing, a fast heartbeat, dizziness, and weakness. These would usually start a few minutes to a few hours after the vaccination. What should I do?  If you think it is a severe allergic reaction or other emergency that can't wait, call 9-1-1 or get the person to the nearest hospital. Otherwise, call your doctor.  Afterward, the reaction should be reported to the Vaccine Adverse Event Reporting System  (VAERS). Your doctor might file this report, or you can do it yourself through the VAERS web site at www.vaers.LAgents.no, or by calling 1-(725)669-7025. VAERS does not give medical advice.  6. The National Vaccine Injury Compensation Program The [employer] Vaccine Injury Compensation Program (VICP) is a federal program that was created to compensate people who may have been injured by certain vaccines. Persons who believe they may have been injured by a vaccine can learn about the program and about filing a claim by calling 1-219-465-0113 or visiting the VICP website at SpiritualWord.at. There is a time limit to file a claim for compensation. 7. How can I learn more?  Ask your doctor. He or she can give you  the vaccine package insert or suggest other sources of information.  Call your local or state health department.  Contact the Centers for Disease Control and Prevention (CDC):  Call (639)646-83441-512-790-9101 (1-800-CDC-INFO) or  Visit CDC's website at PicCapture.uywww.cdc.gov/vaccines CDC Tdap Vaccine VIS (11/16/13)   This information is not intended to replace advice given to you by your health care provider. Make sure you discuss any questions you have with your health care provider.   Document Released: 03/10/2012 Document Revised: 09/30/2014 Document Reviewed: 12/22/2013 Elsevier Interactive Patient Education 2016 Elsevier Inc. Influenza Virus Vaccine injection (Fluarix) What is this medicine? INFLUENZA VIRUS VACCINE (in floo EN zuh VAHY ruhs vak SEEN) helps to reduce the risk of getting influenza also known as the flu. This medicine may be used for other purposes; ask your health care provider or pharmacist if you have questions. What should I tell my health care provider before I take this medicine? They need to know if you have any of these conditions: -bleeding disorder like hemophilia -fever or infection -Guillain-Barre syndrome or other neurological problems -immune system  problems -infection with the human immunodeficiency virus (HIV) or AIDS -low blood platelet counts -multiple sclerosis -an unusual or allergic reaction to influenza virus vaccine, eggs, chicken proteins, latex, gentamicin, other medicines, foods, dyes or preservatives -pregnant or trying to get pregnant -breast-feeding How should I use this medicine? This vaccine is for injection into a muscle. It is given by a health care professional. A copy of Vaccine Information Statements will be given before each vaccination. Read this sheet carefully each time. The sheet may change frequently. Talk to your pediatrician regarding the use of this medicine in children. Special care may be needed. Overdosage: If you think you have taken too much of this medicine contact a poison control center or emergency room at once. NOTE: This medicine is only for you. Do not share this medicine with others. What if I miss a dose? This does not apply. What may interact with this medicine? -chemotherapy or radiation therapy -medicines that lower your immune system like etanercept, anakinra, infliximab, and adalimumab -medicines that treat or prevent blood clots like warfarin -phenytoin -steroid medicines like prednisone or cortisone -theophylline -vaccines This list may not describe all possible interactions. Give your health care provider a list of all the medicines, herbs, non-prescription drugs, or dietary supplements you use. Also tell them if you smoke, drink alcohol, or use illegal drugs. Some items may interact with your medicine. What should I watch for while using this medicine? Report any side effects that do not go away within 3 days to your doctor or health care professional. Call your health care provider if any unusual symptoms occur within 6 weeks of receiving this vaccine. You may still catch the flu, but the illness is not usually as bad. You cannot get the flu from the vaccine. The vaccine will not  protect against colds or other illnesses that may cause fever. The vaccine is needed every year. What side effects may I notice from receiving this medicine? Side effects that you should report to your doctor or health care professional as soon as possible: -allergic reactions like skin rash, itching or hives, swelling of the face, lips, or tongue Side effects that usually do not require medical attention (report to your doctor or health care professional if they continue or are bothersome): -fever -headache -muscle aches and pains -pain, tenderness, redness, or swelling at site where injected -weak or tired This list may not describe all possible  side effects. Call your doctor for medical advice about side effects. You may report side effects to FDA at 1-800-FDA-1088. Where should I keep my medicine? This vaccine is only given in a clinic, pharmacy, doctor's office, or other health care setting and will not be stored at home. NOTE: This sheet is a summary. It may not cover all possible information. If you have questions about this medicine, talk to your doctor, pharmacist, or health care provider.    2016, Elsevier/Gold Standard. (2008-04-06 09:30:40)

## 2016-07-29 NOTE — Progress Notes (Signed)
Bonnie PaneMary Stephens, is a 82 y.o. female  ZOX:[id number]:[id number]  WJX:[id number]  DOB - 05/23/1933  CC:  Chief Complaint  Patient presents with  . Establish Care       HPI: Bonnie PaneMary Stephens is a 82 y.o. female here, very poor historian, today to establish medical care.  Last seen 10/20/15 w/ Vikki PortsValerie NP.  Pt here today w/ her dgt, who is typically not her caretaker.  Pt lives w/ her 2 sons, who w/ her niece help her w/ her meds. She has hx of dementia w/ behavior issues, seeing Neuro, on Aricept, HTN, chronic afib on Xarelto, and HLD.  Pt does not remember taking her meds last night or this am, she does not think she did.  Pt c/o of abd discomfort curently, dgt thinks she is hungry since they have not had breakfast yet.    Pt unable to tell me if has dysuria.  Pt dgt, pt is mobile at home, decent appetite overall.   Patient has No headache, No chest pain, No abdominal pain - No Nausea, No new weakness tingling or numbness, No Cough - SOB.    Review of Systems: Limited due to pt's dementia.    No Known Allergies Past Medical History:  Diagnosis Date  . CAD (coronary artery disease)    a. Possible dx -  Lexiscan myoview (9/11): EF 76%, normal wall motion, possible small anterior MI with mild peri-infarct ischemia but cannot rule out breast attenuation, managed conservatively.  . Cerebrovascular accident, embolic (HCC)    left frontal  . Chronic atrial fibrillation (HCC)    a. Chronic. Has had cardioembolic right renal infarct in 12/06 and cardioembolic event to the right arm requiring right brachial embolectomy. Both events occurred while subtherapeutic on coumadin. b. Slow VR 12/2013.  . Diastolic HF (heart failure) (HCC)    Echo (7/11): EF 60-65%, moderate LVH, severe diastolic dysfunction.   . Dyslipidemia   . HTN (hypertension)    a. Resistant HTN. Patient states that this has been poorly controlled for years.   . Iron deficiency anemia   . Memory loss   . Obese   . PAD (peripheral artery  disease) (HCC)    a. peripheral arterial dopplers (5/12) with right mid popliteal artery occlusion with reconstitution of arteries below the knees by collaterals. Only mild reduction in ABI on the right (not tissue-threatening).   Current Outpatient Prescriptions on File Prior to Visit  Medication Sig Dispense Refill  . sodium chloride (OCEAN) 0.65 % SOLN nasal spray Place 1 spray into both nostrils 3 (three) times daily as needed for congestion. (Patient not taking: Reported on 07/29/2016) 104 mL 0  . vitamin B-12 (CYANOCOBALAMIN) 1000 MCG tablet Take 1,000 mcg by mouth daily. Reported on 11/27/2015     No current facility-administered medications on file prior to visit.    Family History  Problem Relation Age of Onset  . Anemia Father   . Hypertension Mother   . Asthma Father    Social History   Social History  . Marital status: Widowed    Spouse name: N/A  . Number of children: 3  . Years of education: HS   Occupational History  . Retired    Social History Main Topics  . Smoking status: Never Smoker  . Smokeless tobacco: Not on file  . Alcohol use No  . Drug use: No  . Sexual activity: Not on file   Other Topics Concern  . Not on file   Social History Narrative  Lives with her sons.   Right-handed.   No caffeine use.    Objective:   Vitals:   07/29/16 0903  BP: (!) 160/116  Pulse: (!) 108  Resp: 16  Temp: 97.7 F (36.5 C)    Filed Weights   07/29/16 0903  Weight: 173 lb (78.5 kg)    BP Readings from Last 3 Encounters:  07/29/16 (!) 160/116  11/27/15 136/82  10/20/15 (!) 159/96    Physical Exam: Constitutional: Patient appears well-developed and well-nourished. No distress. AAOx3, morbid obese.  +noted several bugs (small roaches) on her throughout exam. HENT: Normocephalic, atraumatic, External right and left ear normal. Oropharynx is clear and moist. bilat TMs clear. Eyes: Conjunctivae and EOM are normal. PERRL, no scleral icterus.  Poor  dentition. Neck: Normal ROM. Neck supple. No JVD.  CVS: irreg irreg, S1/S2 +, no murmurs, no gallops, no carotid bruit.  Pulmonary: Effort and breath sounds normal, no stridor, rhonchi, wheezes, rales.  Abdominal: Soft. BS +, obese, no  tenderness, rebound or guarding.  nttp in pelvic region on exam as well. Musculoskeletal: Normal range of motion. No edema and no tenderness.  LE: bilat/ no c/c/e, pulses 2+ bilateral. Neuro: Alert.  muscle tone coordination wnl. No cranial nerve deficit grossly. Skin: Skin is warm and dry. No rash noted. Not diaphoretic. No erythema. No pallor. Psychiatric: Normal mood and affect. Behavior, judgment, thought content normal. - pleasant, poor historian.  Lab Results  Component Value Date   WBC 3.7 (L) 04/14/2015   HGB 13.8 04/14/2015   HCT 41.8 04/14/2015   MCV 92.9 04/14/2015   PLT 161 04/14/2015   Lab Results  Component Value Date   CREATININE 0.96 04/14/2015   BUN 19 04/14/2015   NA 141 04/14/2015   K 4.3 04/14/2015   CL 107 04/14/2015   CO2 28 04/14/2015    Lab Results  Component Value Date   HGBA1C 5.4 12/29/2013   Lipid Panel     Component Value Date/Time   CHOL 126 11/03/2014 1634   TRIG 59.0 11/03/2014 1634   HDL 48.80 11/03/2014 1634   CHOLHDL 3 11/03/2014 1634   VLDL 11.8 11/03/2014 1634   LDLCALC 65 11/03/2014 1634        Depression screen PHQ 2/9 07/29/2016 02/06/2015  Decreased Interest 2 0  Down, Depressed, Hopeless 0 0  PHQ - 2 Score 2 0  Altered sleeping 0 -  Tired, decreased energy 0 -  Change in appetite 0 -  Feeling bad or failure about yourself  0 -  Trouble concentrating 0 -  Moving slowly or fidgety/restless 0 -  Suicidal thoughts 0 -  PHQ-9 Score 2 -    Assessment and plan:   1. Essential hypertension - uncontrolled - uncontrolled, not certain if took meds this am, concern if taking appropriate at home as well. - amLODipine (NORVASC) 10 MG tablet; Take 1 tablet (10 mg total) by mouth daily.  Dispense:  30 tablet; Refill: 3 - CMP and Liver - CBC with Differential - Ambulatory referral to Home Health - home safety eval, ensure appropriate dispensing of meds and taking it appropriately. - f/u 1 month for bp check.  2. Atrial fibrillation with slow ventricular response (HCC) - chronic afib, on xarelto. dgt states pt is ambulating well at home currently, not fall risk, but given dementia, will ask HHC to do safety eval as well. - rivaroxaban (XARELTO) 20 MG TABS tablet; Take 1 tablet (20 mg total) by mouth daily with supper.  Dispense:  30 tablet; Refill: 6 - Ambulatory referral to Home Health  3. Hyperlipidemia, unspecified hyperlipidemia type Currently fasting. - atorvastatin (LIPITOR) 80 MG tablet; TAKE 1 TABLET BY MOUTH DAILY AT 6 PM.  Dispense: 30 tablet; Refill: 5 - Lipid Panel  4. Abdominal discomfort - hunger pains? - Urinalysis Dipstick  - small amt of LE  5. Diabetes mellitus screening a1c screen  6. Dementia with behavioral disturbance, unspecified dementia type following Neuro, on aricept  7. Encounter for immunization - tdap today - Flu Vaccine QUAD 36+ mos IM  8. Mild uti - ua w/ mild le, - doubt cause of abd discomfort - macrobid bid x 3 days  9. Found several roaches/bugs on her during exam - hhc c/s , home safety check  Return in about 4 weeks (around 08/26/2016) for htn.  The patient was given clear instructions to go to ER or return to medical center if symptoms don't improve, worsen or new problems develop. The patient verbalized understanding. The patient was told to call to get lab results if they haven't heard anything in the next week.    This note has been created with Education officer, environmentalDragon speech recognition software and smart phrase technology. Any transcriptional errors are unintentional.   Pete Glatterawn T Langeland, MD, MBA/MHA [facility] [place], [place] [phone]   07/29/2016, 9:57 AM

## 2016-07-29 NOTE — Progress Notes (Signed)
Pt is in the office today for establish care Pt states she is not in any pain Pt states she didn't take bp medicine this morning Pt states she is taking medication without difficulty

## 2016-07-29 NOTE — Telephone Encounter (Signed)
Another attempt made to inquire if the patient has a preference for a home health agency.  Call placed to her daughter, [name] # [phone] and the phone rang 8 times and then the call ended. No option to leave a voicemail message.  Call placed to the patient's home and her son, [name], [name] answered the phone. He provided this CM with the contact # for the patient's grandson, [name], who is listed as a contact. # [phone].  Call placed to # [phone] and this  CM asked for [name]. The phone was answered by [name], Bonnie's wife. She is listed as a contact person. The number noted in EPIC  is also listed as the contact # for [name].  [name] stated that the patient does not have a preference for a home health agency.  [facility] # [phone] and spoke to [name] who stated that they would most likely be able to accept the referral and would be able to see the patient by 08/01/16. Ref[facility] fax[facility] to [facility] # [phone].

## 2016-07-29 NOTE — Telephone Encounter (Signed)
Referral for home health care received from Dr Julien NordmannLangeland. Attempted to contact the patient to inquire if she has a preference for a home health agency. Call placed to # [phone] and her son answered and stated that she was asleep.  CM to call back at a later time. Call placed to her daughter, Bunnie PhilipsLinda Rankin # [phone] and the phone rang 7 times and then ended. No option to leave a message.

## 2016-07-30 ENCOUNTER — Telehealth: Payer: Self-pay

## 2016-07-30 NOTE — Telephone Encounter (Signed)
[facility] # [phone] and he confirmed that they received the fax and have accepted the referral.

## 2016-07-30 NOTE — Telephone Encounter (Signed)
Call [name] [facility] [phone] to confirm acceptance of the home health referral.  He noted that they were having a problem with their fax machine yesterday and they did not receive the referral. Referral re-faxed to # [phone]

## 2016-07-31 ENCOUNTER — Telehealth: Payer: Self-pay

## 2016-07-31 NOTE — Telephone Encounter (Signed)
Contacted pt to go over lab spoke with pt son and made aware of results pt son states he understands and doesn't have any questions or concerns

## 2016-08-06 ENCOUNTER — Telehealth: Payer: Self-pay | Admitting: Internal Medicine

## 2016-08-06 NOTE — Telephone Encounter (Signed)
Left patient a msg informing her that her rx is ready to be picked up.

## 2016-08-09 ENCOUNTER — Encounter: Payer: Self-pay | Admitting: Physician Assistant

## 2016-08-20 ENCOUNTER — Ambulatory Visit: Payer: Medicare Other | Admitting: Physician Assistant

## 2016-08-22 ENCOUNTER — Other Ambulatory Visit: Payer: Self-pay | Admitting: Internal Medicine

## 2016-08-22 DIAGNOSIS — I10 Essential (primary) hypertension: Secondary | ICD-10-CM

## 2016-08-22 MED FILL — DONEPEZIL HCL 10 MG TABLET: 10 | 30 days supply | Qty: 30 | Fill #1

## 2016-08-22 MED FILL — LISINOPRIL 40 MG TABLET: 40 | 30 days supply | Qty: 30 | Fill #2

## 2016-08-22 MED FILL — XARELTO 20 MG TABLET: 20 | 30 days supply | Qty: 30 | Fill #6

## 2016-08-27 ENCOUNTER — Ambulatory Visit: Payer: Medicare Other | Admitting: Physician Assistant

## 2016-08-27 DIAGNOSIS — E7800 Pure hypercholesterolemia, unspecified: Secondary | ICD-10-CM | POA: Insufficient documentation

## 2016-08-27 MED FILL — AMLODIPINE BESYLATE 10 MG T: 10 | 30 days supply | Qty: 30 | Fill #0

## 2016-08-27 NOTE — Progress Notes (Deleted)
Cardiology Office Note:    Date:  08/27/2016   ID:  Bonnie QuamMary L Kowalski, DOB 01-22-1933, MRN [id number]  PCP:  Ambrose FinlandValerie A Keck, NP  Cardiologist:  Dr. Marca Anconaalton McLean   Electrophysiologist:  n/a  Referring MD: Ambrose FinlandKeck, Valerie A, NP   No chief complaint on file. ***  History of Present Illness:    [name] is a 82 y.o. female with a hx of AFib, dementia, diastolic HF, HTN and cardioembolic events to the R kidney, brain and R arm, PAD.  Myoview in 2011 demonstrated possible small anterior MI with mild peri-infarct ischemia vs breast artifact.  She has known occluded popliteal artery and reconstitution vis collaterals by arterial dopplers.  Echo in 4/15 demonstrated normal ejection fraction.  She was last seen in 2/16 by Dr. Marca Anconaalton McLean.  ***  Prior CV studies that were reviewed today include:    LE Arterial Dopplers 2/16 R 0.78; L 0.94 Stable bilateral ABI's in the setting of chronic right popliteal artery occlusion. Right ABI is in the moderate range, and left is in the mild range. Bilateral great toe pressures are abnormal.  Echo 4/15 EF 55-60, no RWMA, mild MR, mod LAE, mod RAE, mod TR, PASP 42  Myoview 9/11 Overall Impression: Abnormal lexiscan nuclear study with small prior anterior infarct and very mild peri-infarct ischemia; cannot R/O shifting breast attenuation as cause of defect. EF 76.  Echo 7/11 EF 60-65, mod LVH, no RWMA, restrictive physiology, mod to severe LAE, mod RAE, PASP 44  Past Medical History:  Diagnosis Date  . CAD (coronary artery disease)    a. Possible dx -  Lexiscan myoview (9/11): EF 76%, normal wall motion, possible small anterior MI with mild peri-infarct ischemia but cannot rule out breast attenuation, managed conservatively.  . Cerebrovascular accident, embolic (HCC)    left frontal  . Chronic atrial fibrillation (HCC)    a. Chronic. Has had cardioembolic right renal infarct in 12/06 and cardioembolic event to the right arm requiring right brachial  embolectomy. Both events occurred while subtherapeutic on coumadin. b. Slow VR 12/2013.  . Diastolic HF (heart failure) (HCC)    Echo (7/11): EF 60-65%, moderate LVH, severe diastolic dysfunction.   . Dyslipidemia   . HTN (hypertension)    a. Resistant HTN. Patient states that this has been poorly controlled for years.   . Iron deficiency anemia   . Memory loss   . Obese   . PAD (peripheral artery disease) (HCC)    a. peripheral arterial dopplers (5/12) with right mid popliteal artery occlusion with reconstitution of arteries below the knees by collaterals. Only mild reduction in ABI on the right (not tissue-threatening).  1. Resistant HTN. Patient states that this has been poorly controlled for years.  2. Diastolic CHF:  Echo (7/11): EF 60-65%, moderate LVH, severe diastolic dysfunction.  Echo (4/15) with EF 55-60%, mild MR, moderate TR, PA systolic pressure 42 mmHg.  3. Iron deficiency anemia. 4. Atrial fibrillation: Chronic. Has had cardioembolic right renal infarct in 12/06 and cardioembolic event to the right arm requiring right brachial embolectomy.  Both events occurred while subtherapeutic on coumadin.  5. Obese.  6. Left frontal embolic cerebrovascular accident. 7. Dyslipidemia. 8. Possible CAD: Lexiscan myoview (9/11): EF 76%, normal wall motion, possible small anterior MI with mild peri-infarct ischemia but cannot rule out breast attenuation.  9. PAD: peripheral arterial dopplers (5/12) with right mid popliteal artery occlusion with reconstitution of arteries below the knees by collaterals.  Only mild reduction in  ABI on the right (not tissue-threatening).  10. Dementia 11. Bradycardia: Coreg stopped.   Past Surgical History:  Procedure Laterality Date  . EMBOLECTOMY     right brachial embolectomy    Current Medications: No outpatient prescriptions have been marked as taking for the 08/27/16 encounter (Appointment) with Beatrice LecherScott T Tandre Conly, PA-C.     Allergies:   Patient has no  known allergies.   Social History   Social History  . Marital status: Widowed    Spouse name: N/A  . Number of children: 3  . Years of education: HS   Occupational History  . Retired    Social History Main Topics  . Smoking status: Never Smoker  . Smokeless tobacco: Never Used  . Alcohol use No  . Drug use: No  . Sexual activity: Not on file   Other Topics Concern  . Not on file   Social History Narrative   Lives with her sons.   Right-handed.   No caffeine use.     Family History:  The patient's ***family history includes Anemia in her father; Asthma in her father; Hypertension in her mother.   ROS:   Please see the history of present illness.    ROS All other systems reviewed and are negative.   EKGs/Labs/Other Test Reviewed:    EKG:  EKG is *** ordered today.  The ekg ordered today demonstrates ***  Recent Labs: 07/29/2016: ALT 9; BUN 24; Creat 1.07; Hemoglobin 12.5; Platelets 215; Potassium 3.9; Sodium 141   Recent Lipid Panel    Component Value Date/Time   CHOL 214 (H) 07/29/2016 0922   TRIG 68 07/29/2016 0922   HDL 62 07/29/2016 0922   CHOLHDL 3.5 07/29/2016 0922   VLDL 14 07/29/2016 0922   LDLCALC 138 (H) 07/29/2016 0922   LDLDIRECT 142.7 07/23/2010 0857     Physical Exam:    VS:  There were no vitals taken for this visit.    Wt Readings from Last 3 Encounters:  07/29/16 173 lb (78.5 kg)  11/27/15 170 lb 12.8 oz (77.5 kg)  10/20/15 166 lb 9.6 oz (75.6 kg)     ***Physical Exam  ASSESSMENT:    No diagnosis found. PLAN:    In order of problems listed above:  1. Permanent atrial fibrillation - She has a hx of cardioembolic events to the brain, R arm and kidney in the setting of subtherapeutic INR.  She is now on Xarelto.  She is not on beta-blocker due to bradycardia.  ***Xarelto.  *** 2. HTN - *** 3. CAD - Possible CAD by prior nuclear stress test.  She is being managed medically.  *** She is not on ASA as she is on Xarelto.  ***Statin.    4. HL - LDL in 11/17 was 138.  *** 5. PAD - Known popliteal occlusion on the R.  ABIs stable in 2016. *** 6. Chronic diastolic CHF - ***  Medication Adjustments/Labs and Tests Ordered: Current medicines are reviewed at length with the patient today.  Concerns regarding medicines are outlined above.  Medication changes, Labs and Tests ordered today are outlined in the Patient Instructions noted below. There are no Patient Instructions on file for this visit. Signed, Tereso NewcomerScott Madalynne Gutmann, PA-C  08/27/2016 1:56 PM    [facility] [address], [place], [place]  [address] Phone: [phone]; Fax: [phone]

## 2016-09-05 ENCOUNTER — Encounter: Payer: Self-pay | Admitting: Physician Assistant

## 2016-10-01 MED FILL — AMLODIPINE BESYLATE 10 MG T: 10 | 30 days supply | Qty: 30 | Fill #1

## 2016-10-01 MED FILL — LISINOPRIL 40 MG TABLET: 40 | 30 days supply | Qty: 30 | Fill #3

## 2016-10-25 MED FILL — LISINOPRIL 40 MG TABLET: 40 | 30 days supply | Qty: 30 | Fill #0

## 2016-10-25 MED FILL — AMLODIPINE BESYLATE 10 MG T: 10 | 30 days supply | Qty: 30 | Fill #2

## 2016-10-25 MED FILL — DONEPEZIL HCL 10 MG TABLET: 10 | 30 days supply | Qty: 30 | Fill #2

## 2016-10-28 MED FILL — XARELTO 20 MG TABLET: 20 | 30 days supply | Qty: 30 | Fill #0

## 2016-12-23 MED FILL — XARELTO 20 MG TABLET: 20 | 30 days supply | Qty: 30 | Fill #1

## 2016-12-23 MED FILL — DONEPEZIL HCL 10 MG TABLET: 10 | 30 days supply | Qty: 30 | Fill #0

## 2016-12-23 MED FILL — LISINOPRIL 40 MG TABLET: 40 | 30 days supply | Qty: 30 | Fill #1

## 2016-12-23 MED FILL — AMLODIPINE BESYLATE 10 MG T: 10 | 30 days supply | Qty: 30 | Fill #3

## 2017-04-19 ENCOUNTER — Encounter (HOSPITAL_COMMUNITY): Payer: Self-pay | Admitting: Emergency Medicine

## 2017-04-19 ENCOUNTER — Emergency Department (HOSPITAL_COMMUNITY): Payer: Medicare Other

## 2017-04-19 ENCOUNTER — Inpatient Hospital Stay (HOSPITAL_COMMUNITY)
Admission: EM | Admit: 2017-04-19 | Discharge: 2017-04-23 | DRG: 871 | Disposition: A | Discharge status: Skilled Nursing Facility | Payer: Medicare Other | Attending: Internal Medicine | Admitting: Internal Medicine

## 2017-04-19 DIAGNOSIS — A419 Sepsis, unspecified organism: Principal | ICD-10-CM

## 2017-04-19 DIAGNOSIS — E876 Hypokalemia: Secondary | ICD-10-CM | POA: Diagnosis present

## 2017-04-19 DIAGNOSIS — G9340 Encephalopathy, unspecified: Secondary | ICD-10-CM

## 2017-04-19 DIAGNOSIS — I110 Hypertensive heart disease with heart failure: Secondary | ICD-10-CM | POA: Diagnosis present

## 2017-04-19 DIAGNOSIS — I482 Chronic atrial fibrillation: Secondary | ICD-10-CM | POA: Diagnosis present

## 2017-04-19 DIAGNOSIS — I5032 Chronic diastolic (congestive) heart failure: Secondary | ICD-10-CM | POA: Diagnosis present

## 2017-04-19 DIAGNOSIS — E860 Dehydration: Secondary | ICD-10-CM

## 2017-04-19 DIAGNOSIS — Z79899 Other long term (current) drug therapy: Secondary | ICD-10-CM

## 2017-04-19 DIAGNOSIS — Z7901 Long term (current) use of anticoagulants: Secondary | ICD-10-CM | POA: Diagnosis not present

## 2017-04-19 DIAGNOSIS — J440 Chronic obstructive pulmonary disease with acute lower respiratory infection: Secondary | ICD-10-CM | POA: Diagnosis present

## 2017-04-19 DIAGNOSIS — J150 Pneumonia due to Klebsiella pneumoniae: Secondary | ICD-10-CM | POA: Diagnosis present

## 2017-04-19 DIAGNOSIS — N179 Acute kidney failure, unspecified: Secondary | ICD-10-CM

## 2017-04-19 DIAGNOSIS — F0390 Unspecified dementia without behavioral disturbance: Secondary | ICD-10-CM | POA: Diagnosis present

## 2017-04-19 DIAGNOSIS — Z8673 Personal history of transient ischemic attack (TIA), and cerebral infarction without residual deficits: Secondary | ICD-10-CM | POA: Diagnosis not present

## 2017-04-19 DIAGNOSIS — G9341 Metabolic encephalopathy: Secondary | ICD-10-CM | POA: Diagnosis present

## 2017-04-19 DIAGNOSIS — I360 Nonrheumatic tricuspid (valve) stenosis: Secondary | ICD-10-CM | POA: Diagnosis not present

## 2017-04-19 DIAGNOSIS — I10 Essential (primary) hypertension: Secondary | ICD-10-CM | POA: Diagnosis not present

## 2017-04-19 DIAGNOSIS — E785 Hyperlipidemia, unspecified: Secondary | ICD-10-CM | POA: Diagnosis present

## 2017-04-19 DIAGNOSIS — I2510 Atherosclerotic heart disease of native coronary artery without angina pectoris: Secondary | ICD-10-CM | POA: Diagnosis present

## 2017-04-19 DIAGNOSIS — N390 Urinary tract infection, site not specified: Secondary | ICD-10-CM | POA: Diagnosis present

## 2017-04-19 DIAGNOSIS — I248 Other forms of acute ischemic heart disease: Secondary | ICD-10-CM | POA: Diagnosis present

## 2017-04-19 DIAGNOSIS — I4891 Unspecified atrial fibrillation: Secondary | ICD-10-CM

## 2017-04-19 LAB — COMPREHENSIVE METABOLIC PANEL
ALBUMIN: 3.6 g/dL (ref 3.5–5.0)
ALT: 10 U/L — AB (ref 14–54)
AST: 26 U/L (ref 15–41)
Alkaline Phosphatase: 60 U/L (ref 38–126)
Anion gap: 11 (ref 5–15)
BUN: 27 mg/dL — AB (ref 6–20)
CHLORIDE: 105 mmol/L (ref 101–111)
CO2: 26 mmol/L (ref 22–32)
CREATININE: 1.37 mg/dL — AB (ref 0.44–1.00)
Calcium: 9.9 mg/dL (ref 8.9–10.3)
GFR calc Af Amer: 40 mL/min — ABNORMAL LOW (ref >60)
GFR calc non Af Amer: 35 mL/min — ABNORMAL LOW (ref >60)
GLUCOSE: 90 mg/dL (ref 65–99)
Potassium: 3.4 mmol/L — ABNORMAL LOW (ref 3.5–5.1)
SODIUM: 142 mmol/L (ref 135–145)
Total Bilirubin: 1.1 mg/dL (ref 0.3–1.2)
Total Protein: 8.1 g/dL (ref 6.5–8.1)

## 2017-04-19 LAB — URINALYSIS, ROUTINE W REFLEX MICROSCOPIC
BILIRUBIN URINE: NEGATIVE
Glucose, UA: NEGATIVE mg/dL
KETONES UR: 5 mg/dL — AB
NITRITE: POSITIVE — AB
Protein, ur: 30 mg/dL — AB
SPECIFIC GRAVITY, URINE: 1.011 (ref 1.005–1.030)
pH: 5 (ref 5.0–8.0)

## 2017-04-19 LAB — PROTIME-INR
INR: 1.24
Prothrombin Time: 15.7 seconds — ABNORMAL HIGH (ref 11.4–15.2)

## 2017-04-19 LAB — CBG MONITORING, ED: Glucose-Capillary: 90 mg/dL (ref 65–99)

## 2017-04-19 LAB — CBC
HEMATOCRIT: 38.4 % (ref 36.0–46.0)
Hemoglobin: 12.7 g/dL (ref 12.0–15.0)
MCH: 29.3 pg (ref 26.0–34.0)
MCHC: 33.1 g/dL (ref 30.0–36.0)
MCV: 88.5 fL (ref 78.0–100.0)
PLATELETS: 173 10*3/uL (ref 150–400)
RBC: 4.34 MIL/uL (ref 3.87–5.11)
RDW: 12.8 % (ref 11.5–15.5)
WBC: 3.1 10*3/uL — AB (ref 4.0–10.5)

## 2017-04-19 LAB — I-STAT CG4 LACTIC ACID, ED: LACTIC ACID, VENOUS: 2.84 mmol/L — AB (ref 0.5–1.9)

## 2017-04-19 LAB — MAGNESIUM: Magnesium: 2 mg/dL (ref 1.7–2.4)

## 2017-04-19 MED ORDER — DEXTROSE 5 % IV SOLN
1.0000 g | Freq: Once | INTRAVENOUS | Status: AC
  Administered 2017-04-19: 1 g via INTRAVENOUS
  Filled 2017-04-19: qty 10

## 2017-04-19 MED ORDER — METOPROLOL TARTRATE 5 MG/5ML IV SOLN
2.5000 mg | Freq: Once | INTRAVENOUS | Status: DC
  Filled 2017-04-19: qty 5

## 2017-04-19 MED ORDER — SODIUM CHLORIDE 0.9 % IV BOLUS (SEPSIS)
1000.0000 mL | Freq: Once | INTRAVENOUS | Status: AC
  Administered 2017-04-19: 1000 mL via INTRAVENOUS

## 2017-04-19 NOTE — ED Notes (Signed)
Stuck pt for blood cultures twice, two unsuccessful attempts. RN aware.

## 2017-04-19 NOTE — ED Provider Notes (Signed)
[facility]-EMERGENCY DEPT Provider Note   CSN: [id number] Arrival date & time: 04/19/17  1938     History   Chief Complaint Chief Complaint  Patient presents with  . Altered Mental Status  . Urinary Symptoms  . Dementia    HPI [name] is a 83 y.o. female.  HPI   83 yo F with h/o dementia, CVA, chronic AFib, dCHF here with AMS. History limited 2/2 dementia. No family present. Per report from EMS and nursing, pt reportedly has had several weeks of increasing confusion, paranoia. Over the last several days she has had foul-smelling urine and been more confused, more fatigued than usual. No falls. No known fevers.  Level 5 caveat invoked as remainder of history, ROS, and physical exam limited due to patient's dementia.   Past Medical History:  Diagnosis Date  . CAD (coronary artery disease)    a. Possible dx -  Lexiscan myoview (9/11): EF 76%, normal wall motion, possible small anterior MI with mild peri-infarct ischemia but cannot rule out breast attenuation, managed conservatively.  . Cerebrovascular accident, embolic (HCC)    left frontal  . Chronic atrial fibrillation (HCC)    a. Chronic. Has had cardioembolic right renal infarct in 12/06 and cardioembolic event to the right arm requiring right brachial embolectomy. Both events occurred while subtherapeutic on coumadin. b. Slow VR 12/2013.  . Diastolic HF (heart failure) (HCC)    Echo (7/11): EF 60-65%, moderate LVH, severe diastolic dysfunction.   . Dyslipidemia   . HTN (hypertension)    a. Resistant HTN. Patient states that this has been poorly controlled for years.   . Iron deficiency anemia   . Memory loss   . Obese   . PAD (peripheral artery disease) (HCC)    a. peripheral arterial dopplers (5/12) with right mid popliteal artery occlusion with reconstitution of arteries below the knees by collaterals. Only mild reduction in ABI on the right (not tissue-threatening).    Patient Active Problem List   Diagnosis Date  Noted  . Dehydration 04/19/2017  . Acute lower UTI 04/19/2017  . Atrial fibrillation with RVR (HCC) 04/19/2017  . AKI (acute kidney injury) (HCC) 04/19/2017  . Sepsis (HCC) 04/19/2017  . Pure hypercholesterolemia 08/27/2016  . Encounter for therapeutic drug monitoring 01/20/2014  . Dementia with behavioral disturbance 01/01/2014  . Leukopenia 12/30/2013  . Abnormal TSH 12/30/2013  . Hypokalemia 12/30/2013  . Acute encephalopathy 12/29/2013  . Atrial fibrillation with slow ventricular response (HCC) 12/29/2013  . Thrombocytopenia, unspecified (HCC) 12/29/2013  . Bradycardia 12/29/2013  . Long term (current) use of anticoagulants 01/24/2011  . Possible CAD - abnl myoview 2011, managed conservatively 01/17/2011  . PAD (peripheral artery disease) (HCC) 01/17/2011  . Chronic diastolic heart failure (HCC) 07/23/2010  . ANEMIA 06/19/2009  . HTN (hypertension) 06/02/2007    Past Surgical History:  Procedure Laterality Date  . EMBOLECTOMY     right brachial embolectomy    OB History    No data available       Home Medications    Prior to Admission medications   Medication Sig Start Date End Date Taking? Authorizing Provider  amLODipine (NORVASC) 10 MG tablet Take 1 tablet (10 mg total) by mouth daily. 07/29/16  Yes Langeland, Dawn T, MD  donepezil (ARICEPT) 10 MG tablet Take 1 tablet (10 mg total) by mouth at bedtime. 07/29/16  Yes Langeland, Dawn T, MD  lisinopril (PRINIVIL,ZESTRIL) 40 MG tablet Take 1 tablet (40 mg total) by mouth daily. 07/29/16  Yes  Pete Glatter, MD  rivaroxaban (XARELTO) 20 MG TABS tablet Take 1 tablet (20 mg total) by mouth daily with supper. 07/29/16  Yes Langeland, Dawn T, MD  vitamin B-12 (CYANOCOBALAMIN) 1000 MCG tablet Take 1,000 mcg by mouth daily. Reported on 11/27/2015   Yes [provider]  atorvastatin (LIPITOR) 80 MG tablet TAKE 1 TABLET BY MOUTH DAILY AT 6 PM. Patient not taking: Reported on 04/19/2017 07/29/16   Dierdre Searles T, MD    sodium chloride (OCEAN) 0.65 % SOLN nasal spray Place 1 spray into both nostrils 3 (three) times daily as needed for congestion. Patient not taking: Reported on 07/29/2016 04/05/15   Marisa Severin, MD    Family History Family History  Problem Relation Age of Onset  . Anemia Father   . Asthma Father   . Hypertension Mother     Social History Social History  Substance Use Topics  . Smoking status: Never Smoker  . Smokeless tobacco: Never Used  . Alcohol use No     Allergies   Patient has no known allergies.   Review of Systems Review of Systems  Unable to perform ROS: Dementia     Physical Exam Updated Vital Signs BP (!) 156/65 (BP Location: Right Arm)   Pulse (!) 122   Temp 97.6 F (36.4 C) (Axillary)   Resp 20   Ht 5\' 4"  (1.626 m)   Wt 68.9 kg (151 lb 14.4 oz)   SpO2 93%   BMI 26.07 kg/m   Physical Exam  Constitutional: She appears well-developed and well-nourished. No distress.  HENT:  Head: Normocephalic and atraumatic.  Moderately dry MM  Eyes: Conjunctivae are normal.  Neck: Neck supple.  Cardiovascular: Normal heart sounds.  An irregularly irregular rhythm present. Tachycardia present.  Exam reveals no friction rub.   No murmur heard. Pulmonary/Chest: Effort normal and breath sounds normal. No respiratory distress. She has no wheezes. She has no rales.  Abdominal: Soft. She exhibits no distension. There is tenderness (mild, suprapubic).  Musculoskeletal: She exhibits no edema.  Neurological: She is alert.  Face symmetric. Oriented to person only. MAE with at least antigravity strength.  Skin: Skin is warm. Capillary refill takes less than 2 seconds.  Psychiatric: She has a normal mood and affect.  Nursing note and vitals reviewed.    ED Treatments / Results  Labs (all labs ordered are listed, but only abnormal results are displayed) Labs Reviewed  COMPREHENSIVE METABOLIC PANEL - Abnormal; Notable for the following:       Result Value    Potassium 3.4 (*)    BUN 27 (*)    Creatinine, Ser 1.37 (*)    ALT 10 (*)    GFR calc non Af Amer 35 (*)    GFR calc Af Amer 40 (*)    All other components within normal limits  CBC - Abnormal; Notable for the following:    WBC 3.1 (*)    All other components within normal limits  URINALYSIS, ROUTINE W REFLEX MICROSCOPIC - Abnormal; Notable for the following:    Hgb urine dipstick MODERATE (*)    Ketones, ur 5 (*)    Protein, ur 30 (*)    Nitrite POSITIVE (*)    Leukocytes, UA TRACE (*)    Bacteria, UA MANY (*)    Squamous Epithelial / LPF 0-5 (*)    All other components within normal limits  PROTIME-INR - Abnormal; Notable for the following:    Prothrombin Time 15.7 (*)  All other components within normal limits  TSH - Abnormal; Notable for the following:    TSH 0.047 (*)    All other components within normal limits  COMPREHENSIVE METABOLIC PANEL - Abnormal; Notable for the following:    Creatinine, Ser 1.19 (*)    Albumin 3.4 (*)    ALT 12 (*)    GFR calc non Af Amer 41 (*)    GFR calc Af Amer 48 (*)    All other components within normal limits  CBC - Abnormal; Notable for the following:    WBC 3.7 (*)    Platelets 136 (*)    All other components within normal limits  TROPONIN I - Abnormal; Notable for the following:    Troponin I 1.66 (*)    All other components within normal limits  TROPONIN I - Abnormal; Notable for the following:    Troponin I 1.49 (*)    All other components within normal limits  I-STAT CG4 LACTIC ACID, ED - Abnormal; Notable for the following:    Lactic Acid, Venous 2.84 (*)    All other components within normal limits  CULTURE, BLOOD (ROUTINE X 2)  CULTURE, BLOOD (ROUTINE X 2)  MAGNESIUM  LACTIC ACID, PLASMA  LACTIC ACID, PLASMA  PROCALCITONIN  MAGNESIUM  PHOSPHORUS  TROPONIN I  CBG MONITORING, ED  I-STAT CG4 LACTIC ACID, ED  CG4 I-STAT (LACTIC ACID)    EKG  EKG Interpretation  Date/Time:  Saturday April 19 2017 20:09:25  EDT Ventricular Rate:  105 PR Interval:    QRS Duration: 98 QT Interval:  335 QTC Calculation: 443 R Axis:   24 Text Interpretation:  Atrial fibrillation Borderline repolarization abnormality Since last EKG, non-specific ST changes, likely rate related Confirmed by Shaune Pollack [id number]) on 04/20/2017 12:27:03 PM       Radiology Ct Head Wo Contrast  Result Date: 04/19/2017 CLINICAL DATA:  Altered mental status. EXAM: CT HEAD WITHOUT CONTRAST TECHNIQUE: Contiguous axial images were obtained from the base of the skull through the vertex without intravenous contrast. COMPARISON:  CT scan of July 17, 2015. FINDINGS: Brain: Mild chronic ischemic white matter disease is noted. Old left parietal infarction is noted. No mass effect or midline shift is noted. Ventricular size is within normal limits. There is no evidence of mass lesion, hemorrhage or acute infarction. Vascular: No hyperdense vessel or unexpected calcification. Skull: Normal. Negative for fracture or focal lesion. Sinuses/Orbits: No acute finding. Other: None. IMPRESSION: Mild chronic ischemic white matter disease. Old left parietal infarction. No acute intracranial abnormality seen. Electronically Signed   By: Lupita Raider, M.D.   On: 04/19/2017 21:42   Dg Chest Portable 1 View  Result Date: 04/19/2017 CLINICAL DATA:  Altered mental status. Increasing dementia over 1 month. Increasing paranoia. EXAM: PORTABLE CHEST 1 VIEW COMPARISON:  04/14/2015 FINDINGS: Shallow inspiration with elevation of the left hemidiaphragm. Colonic interposition under the left hemidiaphragm similar to prior study. Atelectasis in the left lung base. Cardiac enlargement without vascular congestion. No edema or consolidation. No blunting of costophrenic angles. No pneumothorax. Calcified and tortuous aorta. Degenerative changes in the spine and shoulders. IMPRESSION: Elevation of left hemidiaphragm with colonic interposition and left lung base atelectasis.  Cardiac enlargement. Findings are unchanged since prior study. No active consolidation or infiltration. Electronically Signed   By: Burman Nieves M.D.   On: 04/19/2017 21:09    Procedures Procedures (including critical care time)  Medications Ordered in ED Medications  metoprolol tartrate (LOPRESSOR) injection 2.5 mg (2.5  mg Intravenous Not Given 04/19/17 2312)  donepezil (ARICEPT) tablet 10 mg (10 mg Oral Not Given 04/20/17 0145)  acetaminophen (TYLENOL) tablet 650 mg (not administered)    Or  acetaminophen (TYLENOL) suppository 650 mg (not administered)  HYDROcodone-acetaminophen (NORCO/VICODIN) 5-325 MG per tablet 1-2 tablet (not administered)  ondansetron (ZOFRAN) tablet 4 mg (not administered)    Or  ondansetron (ZOFRAN) injection 4 mg (not administered)  sodium chloride flush (NS) 0.9 % injection 3 mL (3 mLs Intravenous Not Given 04/20/17 1000)  0.9 %  sodium chloride infusion ( Intravenous New Bag/Given 04/20/17 0145)  potassium chloride SA (K-DUR,KLOR-CON) CR tablet 20 mEq (20 mEq Oral Not Given 04/20/17 0137)  cefTRIAXone (ROCEPHIN) 1 g in dextrose 5 % 50 mL IVPB (not administered)  Rivaroxaban (XARELTO) tablet 15 mg (not administered)  sodium chloride 0.9 % bolus 1,000 mL (0 mLs Intravenous Stopped 04/19/17 2235)  sodium chloride 0.9 % bolus 1,000 mL (0 mLs Intravenous Stopped 04/19/17 2303)  cefTRIAXone (ROCEPHIN) 1 g in dextrose 5 % 50 mL IVPB (0 g Intravenous Stopped 04/19/17 2345)     Initial Impression / Assessment and Plan / ED Course  I have reviewed the triage vital signs and the nursing notes.  Pertinent labs & imaging results that were available during my care of the patient were reviewed by me and considered in my medical decision making (see chart for details).     83 yo F with extensive PMHx as above here with increasing confusion. Lab work shows mild lactic acidosis, ketonuria, and +UTI, c/w likely delirium 2/2 dehydration and UTI. Pt given fluids. Pt also in  AFib, rate variable but per discussion with Hospitalist, will hold on beta blockade at this time. Admit to tele.  Final Clinical Impressions(s) / ED Diagnoses   Final diagnoses:  Lower urinary tract infection  Atrial fibrillation with rapid ventricular response [facility](HCC)    New Prescriptions Current Discharge Medication List       Shaune PollackIsaacs, Keyontay Stolz, MD 04/20/17 1228

## 2017-04-19 NOTE — ED Notes (Signed)
Notified EDP,Isaacs,MD., pt. I-stat Lactic acid results 2.84 and RN,Sara made aware.

## 2017-04-19 NOTE — ED Notes (Signed)
Bed: [id number] Expected date:  Expected time:  Means of arrival:  Comments: EMS 83 yo female from home/increased dementia over the last month CBG 121-non-compliant with meds x 1 week

## 2017-04-19 NOTE — ED Notes (Signed)
Attempted to in and out cath patient x2, no urine returned. External catheter placed.

## 2017-04-19 NOTE — ED Notes (Signed)
Attempted to obtain second blood culture. Unable to get blood return.

## 2017-04-19 NOTE — H&P (Signed)
[name] WUJ:[id number] DOB: 1933-02-09 DOA: 04/19/2017     PCP: System, Provider Not In   Outpatient Specialists: [facility]   Patient coming from:   home Lives   With family    Chief Complaint: Confusion foul-smelling urine  HPI: Bonnie QuamMary L Stephens is a 83 y.o. female with medical history significant of Atrial Fib chronic anticoagulation, dementia , Hyperlipidemia, COPD, history of CVA, history of diastolic CHF or echogram in 2011, PAD     Presented with worsening confusion with fusion to take her home medications foul-smelling urine. Patient has dementia at baseline and unable to provide detailed history.  she hasn't been taking her home medications for at least a week. Her dementia has been slowly progressing but for past 1 week has been getting much worse. Family is stating that she is paranoid that her family is trying to kill her.   Regarding pertinent Chronic problems: Patient has known history of atrial fibrillation on Xarelto the past she has not required anything for rate control. She has history of CVA. History of diastolic CHF but not on any diuretics have been stable. History of dementia takes Aricept   IN ER:  Temp (24hrs), Avg:98.3 F (36.8 C), Min:98.3 F (36.8 C), Max:98.3 F (36.8 C)      on arrival  ED Triage Vitals  Enc Vitals Group     BP 04/19/17 1950 (!) 149/109     Pulse Rate 04/19/17 1950 97     Resp 04/19/17 1950 (!) 24     Temp 04/19/17 1950 98.3 F (36.8 C)     Temp Source 04/19/17 1950 Oral     SpO2 04/19/17 1946 99 %     Weight --      Height --      Head Circumference --      Peak Flow --      Pain Score --      Pain Loc --      Pain Edu? --      Excl. in GC? --    RR 22 98% RA HR down to Mid 90's was up to 114 BP 155/105 Lactic Acid 2.84 Mg 2.0 INR 1.24  Na 142 K 3.4Cr 1.37 up from   baseline 1.07 WBC 3.1  CT head: Non acute CXR : Nonacute Following Medications were ordered in ER: Medications  cefTRIAXone (ROCEPHIN) 1 g in  dextrose 5 % 50 mL IVPB (not administered)  sodium chloride 0.9 % bolus 1,000 mL (0 mLs Intravenous Stopped 04/19/17 2235)  sodium chloride 0.9 % bolus 1,000 mL (0 mLs Intravenous Stopped 04/19/17 2303)  metoprolol tartrate (LOPRESSOR) injection 2.5 mg (2.5 mg Intravenous Given 04/19/17 2312)      Hospitalist was called for admission for Sepsis secondary to UTI resulting in acute encephalopathy in the setting of dementia and A. fib RVR  Review of Systems:    Pertinent positives include: fatigue,confusion  Constitutional:  No weight loss, night sweats, Fevers, chills,  weight loss  HEENT:  No headaches, Difficulty swallowing,Tooth/dental problems,Sore throat,  No sneezing, itching, ear ache, nasal congestion, post nasal drip,  Cardio-vascular:  No chest pain, Orthopnea, PND, anasarca, dizziness, palpitations.no Bilateral lower extremity swelling  GI:  No heartburn, indigestion, abdominal pain, nausea, vomiting, diarrhea, change in bowel habits, loss of appetite, melena, blood in stool, hematemesis Resp:  no shortness of breath at rest. No dyspnea on exertion, No excess mucus, no productive cough, No non-productive cough, No coughing up of blood.No change in  color of mucus.No wheezing. Skin:  no rash or lesions. No jaundice GU:  no dysuria, change in color of urine, no urgency or frequency. No straining to urinate.  No flank pain.  Musculoskeletal:  No joint pain or no joint swelling. No decreased range of motion. No back pain.  Psych:  No change in mood or affect. No depression or anxiety. No memory loss.  Neuro: no localizing neurological complaints, no tingling, no weakness, no double vision, no gait abnormality, no slurred speech, no   As per HPI otherwise 10 point review of systems negative.   Past Medical History: Past Medical History:  Diagnosis Date  . CAD (coronary artery disease)    a. Possible dx -  Lexiscan myoview (9/11): EF 76%, normal wall motion, possible small  anterior MI with mild peri-infarct ischemia but cannot rule out breast attenuation, managed conservatively.  . Cerebrovascular accident, embolic (HCC)    left frontal  . Chronic atrial fibrillation (HCC)    a. Chronic. Has had cardioembolic right renal infarct in 12/06 and cardioembolic event to the right arm requiring right brachial embolectomy. Both events occurred while subtherapeutic on coumadin. b. Slow VR 12/2013.  . Diastolic HF (heart failure) (HCC)    Echo (7/11): EF 60-65%, moderate LVH, severe diastolic dysfunction.   . Dyslipidemia   . HTN (hypertension)    a. Resistant HTN. Patient states that this has been poorly controlled for years.   . Iron deficiency anemia   . Memory loss   . Obese   . PAD (peripheral artery disease) (HCC)    a. peripheral arterial dopplers (5/12) with right mid popliteal artery occlusion with reconstitution of arteries below the knees by collaterals. Only mild reduction in ABI on the right (not tissue-threatening).   Past Surgical History:  Procedure Laterality Date  . EMBOLECTOMY     right brachial embolectomy     Social History:      reports that she has never smoked. She has never used smokeless tobacco. She reports that she does not drink alcohol or use drugs.  Allergies:  No Known Allergies     Family History:   Family History  Problem Relation Age of Onset  . Anemia Father   . Asthma Father   . Hypertension Mother     Medications: Prior to Admission medications   Medication Sig Start Date End Date Taking? Authorizing Provider  amLODipine (NORVASC) 10 MG tablet Take 1 tablet (10 mg total) by mouth daily. 07/29/16  Yes Langeland, Dawn T, MD  donepezil (ARICEPT) 10 MG tablet Take 1 tablet (10 mg total) by mouth at bedtime. 07/29/16  Yes Langeland, Dawn T, MD  lisinopril (PRINIVIL,ZESTRIL) 40 MG tablet Take 1 tablet (40 mg total) by mouth daily. 07/29/16  Yes Langeland, Dawn T, MD  rivaroxaban (XARELTO) 20 MG TABS tablet Take 1 tablet  (20 mg total) by mouth daily with supper. 07/29/16  Yes Langeland, Dawn T, MD  vitamin B-12 (CYANOCOBALAMIN) 1000 MCG tablet Take 1,000 mcg by mouth daily. Reported on 11/27/2015   Yes [provider]  atorvastatin (LIPITOR) 80 MG tablet TAKE 1 TABLET BY MOUTH DAILY AT 6 PM. Patient not taking: Reported on 04/19/2017 07/29/16   Dierdre Searles T, MD  sodium chloride (OCEAN) 0.65 % SOLN nasal spray Place 1 spray into both nostrils 3 (three) times daily as needed for congestion. Patient not taking: Reported on 07/29/2016 04/05/15   Marisa Severin, MD    Physical Exam: Patient Vitals for the past 24 hrs:  BP Temp Temp src Pulse Resp SpO2  04/19/17 2225 (!) 155/105 - - (!) 114 (!) 22 98 %  04/19/17 1950 (!) 149/109 98.3 F (36.8 C) Oral 97 (!) 24 97 %  04/19/17 1946 - - - - - 99 %    1. General:  in No Acute distress 2. Psychological: Alert but not  Oriented 3. Head/ENT:     Dry Mucous Membranes                          Head Non traumatic, neck supple                            Poor Dentition 4. SKIN:  decreased Skin turgor,  Skin clean Dry and intact no rash 5. Heart: Regular rate and rhythm no  Murmur, Rub or gallop 6. Lungs:    no wheezes or crackles   7. Abdomen: Soft,  non-tender, Non distended 8. Lower extremities: no clubbing, cyanosis, or edema 9. Neurologically Grossly intact, moving all 4 extremities equally   10. MSK: Normal range of motion   body mass index is unknown because there is no height or weight on file.  Labs on Admission:   Labs on Admission: I have personally reviewed following labs and imaging studies  CBC:  Recent Labs Lab 04/19/17 2047  WBC 3.1*  HGB 12.7  HCT 38.4  MCV 88.5  PLT 173   Basic Metabolic Panel:  Recent Labs Lab 04/19/17 2047 04/19/17 2050  NA 142  --   K 3.4*  --   CL 105  --   CO2 26  --   GLUCOSE 90  --   BUN 27*  --   CREATININE 1.37*  --   CALCIUM 9.9  --   MG  --  2.0   GFR: CrCl cannot be calculated (Unknown  ideal weight.). Liver Function Tests:  Recent Labs Lab 04/19/17 2047  AST 26  ALT 10*  ALKPHOS 60  BILITOT 1.1  PROT 8.1  ALBUMIN 3.6   No results for input(s): LIPASE, AMYLASE in the last 168 hours. No results for input(s): AMMONIA in the last 168 hours. Coagulation Profile:  Recent Labs Lab 04/19/17 2050  INR 1.24   Cardiac Enzymes: No results for input(s): CKTOTAL, CKMB, CKMBINDEX, TROPONINI in the last 168 hours. BNP (last 3 results) No results for input(s): PROBNP in the last 8760 hours. HbA1C: No results for input(s): HGBA1C in the last 72 hours. CBG:  Recent Labs Lab 04/19/17 2013  GLUCAP 90   Lipid Profile: No results for input(s): CHOL, HDL, LDLCALC, TRIG, CHOLHDL, LDLDIRECT in the last 72 hours. Thyroid Function Tests: No results for input(s): TSH, T4TOTAL, FREET4, T3FREE, THYROIDAB in the last 72 hours. Anemia Panel: No results for input(s): VITAMINB12, FOLATE, FERRITIN, TIBC, IRON, RETICCTPCT in the last 72 hours. Urine analysis:    Component Value Date/Time   COLORURINE YELLOW 04/19/2017 2047   APPEARANCEUR CLEAR 04/19/2017 2047   LABSPEC 1.011 04/19/2017 2047   PHURINE 5.0 04/19/2017 2047   GLUCOSEU NEGATIVE 04/19/2017 2047   HGBUR MODERATE (A) 04/19/2017 2047   BILIRUBINUR NEGATIVE 04/19/2017 2047   BILIRUBINUR small 07/29/2016 1103   KETONESUR 5 (A) 04/19/2017 2047   PROTEINUR 30 (A) 04/19/2017 2047   UROBILINOGEN 1.0 07/29/2016 1103   UROBILINOGEN 1.0 04/14/2015 1030   NITRITE POSITIVE (A) 04/19/2017 2047   LEUKOCYTESUR TRACE (A) 04/19/2017 2047   Sepsis  Labs: @LABRCNTIP (procalcitonin:4,lacticidven:4) )No results found for this or any previous visit (from the past 240 hour(s)).    UA   evidence of UTI     Lab Results  Component Value Date   HGBA1C 5.4 12/29/2013    CrCl cannot be calculated (Unknown ideal weight.).  BNP (last 3 results) No results for input(s): PROBNP in the last 8760 hours.   ECG REPORT  Independently  reviewed Rate: 105  Rhythm: A. fib with RVR ST&T Change: No acute ischemic changes  QTC 443  There were no vitals filed for this visit.   Cultures:    Component Value Date/Time   SDES URINE, CLEAN CATCH 01/30/2013 0425   SPECREQUEST NONE 01/30/2013 0425   CULT  01/30/2013 0425    Multiple bacterial morphotypes present, none predominant. Suggest appropriate recollection if clinically indicated.   REPTSTATUS 01/31/2013 FINAL 01/30/2013 0425     Radiological Exams on Admission: Ct Head Wo Contrast  Result Date: 04/19/2017 CLINICAL DATA:  Altered mental status. EXAM: CT HEAD WITHOUT CONTRAST TECHNIQUE: Contiguous axial images were obtained from the base of the skull through the vertex without intravenous contrast. COMPARISON:  CT scan of July 17, 2015. FINDINGS: Brain: Mild chronic ischemic white matter disease is noted. Old left parietal infarction is noted. No mass effect or midline shift is noted. Ventricular size is within normal limits. There is no evidence of mass lesion, hemorrhage or acute infarction. Vascular: No hyperdense vessel or unexpected calcification. Skull: Normal. Negative for fracture or focal lesion. Sinuses/Orbits: No acute finding. Other: None. IMPRESSION: Mild chronic ischemic white matter disease. Old left parietal infarction. No acute intracranial abnormality seen. Electronically Signed   By: Lupita Raider, M.D.   On: 04/19/2017 21:42   Dg Chest Portable 1 View  Result Date: 04/19/2017 CLINICAL DATA:  Altered mental status. Increasing dementia over 1 month. Increasing paranoia. EXAM: PORTABLE CHEST 1 VIEW COMPARISON:  04/14/2015 FINDINGS: Shallow inspiration with elevation of the left hemidiaphragm. Colonic interposition under the left hemidiaphragm similar to prior study. Atelectasis in the left lung base. Cardiac enlargement without vascular congestion. No edema or consolidation. No blunting of costophrenic angles. No pneumothorax. Calcified and tortuous aorta.  Degenerative changes in the spine and shoulders. IMPRESSION: Elevation of left hemidiaphragm with colonic interposition and left lung base atelectasis. Cardiac enlargement. Findings are unchanged since prior study. No active consolidation or infiltration. Electronically Signed   By: Burman Nieves M.D.   On: 04/19/2017 21:09    Chart has been reviewed    Assessment/Plan  83 y.o. female with medical history significant of Atrial Fib chronic anticoagulation, dementia , Hyperlipidemia, COPD, history of CVA, history of diastolic CHF or echogram in 2011, PAD      Admitted for  Sepsis secondary to UTI resulting in acute encephalopathy in the setting of dementia and A. fib RVR   Present on Admission: . Acute encephalopathy -   - most likely multifactorial secondary to combination of infection  dehydration secondary to decreased by mouth intake,   - Will rehydrate   - treat underlining infection     . Dehydration no rehydrate and check of metastatic . Chronic diastolic heart failure (HCC) and appears to be dehydrated patient baseline not on diuretics hold  ACE inhibitor given a AKI. Marland Kitchen HTN (hypertension) - hold lisinopril given a AKI . Hypokalemia replace and check magnesium level . Acute lower UTI - history of Rocephin and await results of urine culture . Atrial fibrillation with RVR (HCC) - heart rate improved  after IV fluid rehydration. Metoprolol IV 2.5 ordered in the ER Heart rate mainly staying below 90s currently Continue Xarelto  . AKI (acute kidney injury) (HCC) most likely secondary to dehydration no rehydrate and continue to follow  . Sepsis (HCC) secondary to UTI to obtain serial lactic acid continue IV rehydration and IV antibiotics but cultures ordered   Other plan as per orders.  DVT prophylaxis:  Xarelto  Code Status:  FULL CODE presumed no family at bed side patient is altered Family Communication:   Family not  at  Bedside     Disposition Plan:     To home once  workup is complete and patient is stable    Would benefit from PT/OT eval prior to DC  ordered                                                 Consults called: none   Admission status:   inpatient   Level of care     tele          I have spent a total of 56 min on this admission  Alwilda Gilland 04/20/2017, 12:23 AM    [facility]  Pager [phone]   after 2 AM please page floor coverage PA If 7AM-7PM, please contact the day team taking care of the patient  [email or web address]  Password [email or web address]

## 2017-04-19 NOTE — ED Triage Notes (Signed)
Pt BIB EMS from home. Per family, increased dementia x1 month. Patient is paranoid that her family is trying to kill her with her medications so she has not taken her meds x2 weeks. Pungent urine odor noted to patient. Hx of dementia, is at baseline but with increased paranoia. Pt states to EMS en route that her family is trying to kill her. Pleasant on arrival, non-combative, follows commands.

## 2017-04-20 LAB — COMPREHENSIVE METABOLIC PANEL
ALBUMIN: 3.4 g/dL — AB (ref 3.5–5.0)
ALT: 12 U/L — ABNORMAL LOW (ref 14–54)
AST: 32 U/L (ref 15–41)
Alkaline Phosphatase: 56 U/L (ref 38–126)
Anion gap: 11 (ref 5–15)
BILIRUBIN TOTAL: 0.8 mg/dL (ref 0.3–1.2)
BUN: 20 mg/dL (ref 6–20)
CO2: 23 mmol/L (ref 22–32)
CREATININE: 1.19 mg/dL — AB (ref 0.44–1.00)
Calcium: 9.3 mg/dL (ref 8.9–10.3)
Chloride: 108 mmol/L (ref 101–111)
GFR calc Af Amer: 48 mL/min — ABNORMAL LOW (ref >60)
GFR, EST NON AFRICAN AMERICAN: 41 mL/min — AB (ref >60)
Glucose, Bld: 87 mg/dL (ref 65–99)
POTASSIUM: 3.8 mmol/L (ref 3.5–5.1)
SODIUM: 142 mmol/L (ref 135–145)
TOTAL PROTEIN: 7.7 g/dL (ref 6.5–8.1)

## 2017-04-20 LAB — PHOSPHORUS: Phosphorus: 3.4 mg/dL (ref 2.5–4.6)

## 2017-04-20 LAB — CBC
HEMATOCRIT: 40 % (ref 36.0–46.0)
Hemoglobin: 13.1 g/dL (ref 12.0–15.0)
MCH: 29.5 pg (ref 26.0–34.0)
MCHC: 32.8 g/dL (ref 30.0–36.0)
MCV: 90.1 fL (ref 78.0–100.0)
PLATELETS: 136 10*3/uL — AB (ref 150–400)
RBC: 4.44 MIL/uL (ref 3.87–5.11)
RDW: 13.1 % (ref 11.5–15.5)
WBC: 3.7 10*3/uL — AB (ref 4.0–10.5)

## 2017-04-20 LAB — TSH: TSH: 0.047 u[IU]/mL — ABNORMAL LOW (ref 0.350–4.500)

## 2017-04-20 LAB — MAGNESIUM: MAGNESIUM: 1.8 mg/dL (ref 1.7–2.4)

## 2017-04-20 LAB — TROPONIN I
TROPONIN I: 1.66 ng/mL — AB (ref <0.03)
TROPONIN I: 1.49 ng/mL — AB (ref <0.03)
TROPONIN I: 1.77 ng/mL — AB (ref <0.03)

## 2017-04-20 LAB — PROCALCITONIN

## 2017-04-20 LAB — CG4 I-STAT (LACTIC ACID): LACTIC ACID, VENOUS: 1.54 mmol/L (ref 0.5–1.9)

## 2017-04-20 LAB — LACTIC ACID, PLASMA
LACTIC ACID, VENOUS: 1.4 mmol/L (ref 0.5–1.9)
LACTIC ACID, VENOUS: 1.6 mmol/L (ref 0.5–1.9)

## 2017-04-20 MED ORDER — SODIUM CHLORIDE 0.9 % IV SOLN
INTRAVENOUS | Status: AC
  Administered 2017-04-20: 02:00:00 via INTRAVENOUS

## 2017-04-20 MED ORDER — SODIUM CHLORIDE 0.9% FLUSH
3.0000 mL | Freq: Two times a day (BID) | INTRAVENOUS | Status: DC
  Administered 2017-04-20 – 2017-04-23 (×5): 3 mL via INTRAVENOUS

## 2017-04-20 MED ORDER — ACETAMINOPHEN 325 MG PO TABS
650.0000 mg | ORAL_TABLET | Freq: Four times a day (QID) | ORAL | Status: DC | PRN
  Administered 2017-04-23: 650 mg via ORAL
  Filled 2017-04-20: qty 2

## 2017-04-20 MED ORDER — ONDANSETRON HCL 4 MG/2ML IJ SOLN: 4.0000 mg | Freq: Four times a day (QID) | INTRAMUSCULAR | Status: DC | PRN

## 2017-04-20 MED ORDER — ACETAMINOPHEN 650 MG RE SUPP: 650.0000 mg | Freq: Four times a day (QID) | RECTAL | Status: DC | PRN

## 2017-04-20 MED ORDER — HYDROCODONE-ACETAMINOPHEN 5-325 MG PO TABS
1.0000 | ORAL_TABLET | ORAL | Status: DC | PRN
  Administered 2017-04-20 – 2017-04-21 (×3): 1 via ORAL
  Filled 2017-04-20 (×2): qty 1
  Filled 2017-04-20: qty 2

## 2017-04-20 MED ORDER — DEXTROSE 5 % IV SOLN
1.0000 g | INTRAVENOUS | Status: DC
  Administered 2017-04-20 – 2017-04-22 (×3): 1 g via INTRAVENOUS
  Filled 2017-04-20 (×3): qty 10

## 2017-04-20 MED ORDER — ONDANSETRON HCL 4 MG PO TABS: 4.0000 mg | ORAL_TABLET | Freq: Four times a day (QID) | ORAL | Status: DC | PRN

## 2017-04-20 MED ORDER — RIVAROXABAN 15 MG PO TABS
15.0000 mg | ORAL_TABLET | Freq: Every day | ORAL | Status: DC
  Administered 2017-04-20 – 2017-04-22 (×3): 15 mg via ORAL
  Filled 2017-04-20 (×3): qty 1

## 2017-04-20 MED ORDER — METOPROLOL TARTRATE 25 MG PO TABS
12.5000 mg | ORAL_TABLET | Freq: Two times a day (BID) | ORAL | Status: DC
  Administered 2017-04-20 – 2017-04-21 (×3): 12.5 mg via ORAL
  Filled 2017-04-20 (×3): qty 1

## 2017-04-20 MED ORDER — DONEPEZIL HCL 10 MG PO TABS
10.0000 mg | ORAL_TABLET | Freq: Every day | ORAL | Status: DC
  Administered 2017-04-20 – 2017-04-22 (×3): 10 mg via ORAL
  Filled 2017-04-20 (×3): qty 1

## 2017-04-20 MED ORDER — RIVAROXABAN 20 MG PO TABS: 20.0000 mg | ORAL_TABLET | Freq: Every day | ORAL | Status: DC

## 2017-04-20 MED ORDER — POTASSIUM CHLORIDE CRYS ER 20 MEQ PO TBCR
20.0000 meq | EXTENDED_RELEASE_TABLET | Freq: Once | ORAL | Status: DC
  Filled 2017-04-20: qty 1

## 2017-04-20 NOTE — ED Notes (Signed)
Patient is a hard stick and writer has stuck patient 2x already, RN will get blood specimens from IV.

## 2017-04-20 NOTE — Progress Notes (Signed)
PROGRESS NOTE  [name] XBJ:[id number] DOB: October 13, 1932 DOA: 04/19/2017 PCP: System, Provider Not In   LOS: 1 day   Brief Narrative / Interim history: 83 year old female with history of A. fib on chronic anticoagulation, dementia, hyperlipidemia, COPD, prior CVA, diastolic CHF, was brought to the hospital by family due to confusion more so than her baseline, adenoid ideation in the setting of foul-smelling urine.  Assessment & Plan: Active Problems:   HTN (hypertension)   Chronic diastolic heart failure (HCC)   Acute encephalopathy   Hypokalemia   Dehydration   Acute lower UTI   Atrial fibrillation with RVR (HCC)   AKI (acute kidney injury) (HCC)   Sepsis (HCC)   Sepsis secondary to urinary tract infection -Started empirically on ceftriaxone, blood cultures are pending, continue -Urine culture unfortunately not sent, will try to add on  Acute encephalopathy -Family not at bedside, unclear baseline, she is alert however confused and cannot answer orientation questions. Monitor mental status  Chronic atrial fibrillation -Rates improve after fluids, continue Xarelto  Acute kidney injury -Due to dehydration in the setting of increased confusion and poor by mouth intake, improved with fluids -Hold lisinopril  Troponin elevation -Initial troponin 1.6, trending down to 1.4, likely in the setting of demand ischemia due to sepsis. She denies any chest pain and is otherwise asymptomatic  Dementia -Continue Aricept  Hypertension -Hold Norvasc in the setting of sepsis   DVT prophylaxis: Xarelto Code Status: Full code Family Communication: no family at bedside Disposition Plan: remain on telemetry, home when ready.   Consultants:   None   Procedures:   None   Antimicrobials:  Ceftriaxone 7/28 >>   Subjective: -Pleasantly demented, no complaints, doesn't consistently follow commands or answer questions  Objective: Vitals:   04/19/17 2225 04/20/17 0025 04/20/17  0126 04/20/17 0446  BP: (!) 155/105 (!) 144/109 (!) 166/100 (!) 156/65  Pulse: (!) 114 (!) 106 97 (!) 122  Resp: (!) 22 (!) 21 20   Temp:   98.3 F (36.8 C) 97.6 F (36.4 C)  TempSrc:   Axillary Axillary  SpO2: 98% 94% 97% 93%  Weight:   68.9 kg (151 lb 14.4 oz)   Height:   5\' 4"  (1.626 m)     Intake/Output Summary (Last 24 hours) at 04/20/17 1225 Last data filed at 04/20/17 0600  Gross per 24 hour  Intake          2368.75 ml  Output              500 ml  Net          1868.75 ml   Filed Weights   04/20/17 0126  Weight: 68.9 kg (151 lb 14.4 oz)    Examination:  Vitals:   04/19/17 2225 04/20/17 0025 04/20/17 0126 04/20/17 0446  BP: (!) 155/105 (!) 144/109 (!) 166/100 (!) 156/65  Pulse: (!) 114 (!) 106 97 (!) 122  Resp: (!) 22 (!) 21 20   Temp:   98.3 F (36.8 C) 97.6 F (36.4 C)  TempSrc:   Axillary Axillary  SpO2: 98% 94% 97% 93%  Weight:   68.9 kg (151 lb 14.4 oz)   Height:   5\' 4"  (1.626 m)     Constitutional: NAD Eyes: lids and conjunctivae normal ENMT: Mucous membranes are moist. Respiratory: clear to auscultation bilaterally, no wheezing, no crackles. Normal respiratory effort. No accessory muscle use.  Cardiovascular: irregular, no murmurs / rubs / gallops. No LE edema. 2+ pedal pulses.  Abdomen: no  tenderness. Bowel sounds positive.  Skin: no rashes, lesions, ulcers. No induration Neurologic: Nonfocal   Data Reviewed: I have independently reviewed following labs and imaging studies   CBC:  Recent Labs Lab 04/19/17 2047 04/20/17 0206  WBC 3.1* 3.7*  HGB 12.7 13.1  HCT 38.4 40.0  MCV 88.5 90.1  PLT 173 136*   Basic Metabolic Panel:  Recent Labs Lab 04/19/17 2047 04/19/17 2050 04/20/17 0206  NA 142  --  142  K 3.4*  --  3.8  CL 105  --  108  CO2 26  --  23  GLUCOSE 90  --  87  BUN 27*  --  20  CREATININE 1.37*  --  1.19*  CALCIUM 9.9  --  9.3  MG  --  2.0 1.8  PHOS  --   --  3.4   GFR: Estimated Creatinine Clearance: 34.8 mL/min  (A) (by C-G formula based on SCr of 1.19 mg/dL (H)). Liver Function Tests:  Recent Labs Lab 04/19/17 2047 04/20/17 0206  AST 26 32  ALT 10* 12*  ALKPHOS 60 56  BILITOT 1.1 0.8  PROT 8.1 7.7  ALBUMIN 3.6 3.4*   No results for input(s): LIPASE, AMYLASE in the last 168 hours. No results for input(s): AMMONIA in the last 168 hours. Coagulation Profile:  Recent Labs Lab 04/19/17 2050  INR 1.24   Cardiac Enzymes:  Recent Labs Lab 04/20/17 0206 04/20/17 0721  TROPONINI 1.66* 1.49*   BNP (last 3 results) No results for input(s): PROBNP in the last 8760 hours. HbA1C: No results for input(s): HGBA1C in the last 72 hours. CBG:  Recent Labs Lab 04/19/17 2013  GLUCAP 90   Lipid Profile: No results for input(s): CHOL, HDL, LDLCALC, TRIG, CHOLHDL, LDLDIRECT in the last 72 hours. Thyroid Function Tests:  Recent Labs  04/20/17 0206  TSH 0.047*   Anemia Panel: No results for input(s): VITAMINB12, FOLATE, FERRITIN, TIBC, IRON, RETICCTPCT in the last 72 hours. Urine analysis:    Component Value Date/Time   COLORURINE YELLOW 04/19/2017 2047   APPEARANCEUR CLEAR 04/19/2017 2047   LABSPEC 1.011 04/19/2017 2047   PHURINE 5.0 04/19/2017 2047   GLUCOSEU NEGATIVE 04/19/2017 2047   HGBUR MODERATE (A) 04/19/2017 2047   BILIRUBINUR NEGATIVE 04/19/2017 2047   BILIRUBINUR small 07/29/2016 1103   KETONESUR 5 (A) 04/19/2017 2047   PROTEINUR 30 (A) 04/19/2017 2047   UROBILINOGEN 1.0 07/29/2016 1103   UROBILINOGEN 1.0 04/14/2015 1030   NITRITE POSITIVE (A) 04/19/2017 2047   LEUKOCYTESUR TRACE (A) 04/19/2017 2047   Sepsis Labs: Invalid input(s): PROCALCITONIN, LACTICIDVEN  No results found for this or any previous visit (from the past 240 hour(s)).    Radiology Studies: Ct Head Wo Contrast  Result Date: 04/19/2017 CLINICAL DATA:  Altered mental status. EXAM: CT HEAD WITHOUT CONTRAST TECHNIQUE: Contiguous axial images were obtained from the base of the skull through the  vertex without intravenous contrast. COMPARISON:  CT scan of July 17, 2015. FINDINGS: Brain: Mild chronic ischemic white matter disease is noted. Old left parietal infarction is noted. No mass effect or midline shift is noted. Ventricular size is within normal limits. There is no evidence of mass lesion, hemorrhage or acute infarction. Vascular: No hyperdense vessel or unexpected calcification. Skull: Normal. Negative for fracture or focal lesion. Sinuses/Orbits: No acute finding. Other: None. IMPRESSION: Mild chronic ischemic white matter disease. Old left parietal infarction. No acute intracranial abnormality seen. Electronically Signed   By: Lupita RaiderJames  Green Jr, M.D.   On: 04/19/2017  21:42   Dg Chest Portable 1 View  Result Date: 04/19/2017 CLINICAL DATA:  Altered mental status. Increasing dementia over 1 month. Increasing paranoia. EXAM: PORTABLE CHEST 1 VIEW COMPARISON:  04/14/2015 FINDINGS: Shallow inspiration with elevation of the left hemidiaphragm. Colonic interposition under the left hemidiaphragm similar to prior study. Atelectasis in the left lung base. Cardiac enlargement without vascular congestion. No edema or consolidation. No blunting of costophrenic angles. No pneumothorax. Calcified and tortuous aorta. Degenerative changes in the spine and shoulders. IMPRESSION: Elevation of left hemidiaphragm with colonic interposition and left lung base atelectasis. Cardiac enlargement. Findings are unchanged since prior study. No active consolidation or infiltration. Electronically Signed   By: Burman NievesWilliam  Stevens M.D.   On: 04/19/2017 21:09     Scheduled Meds: . donepezil  10 mg Oral QHS  . metoprolol tartrate  2.5 mg Intravenous Once  . potassium chloride  20 mEq Oral Once  . rivaroxaban  15 mg Oral Q supper  . sodium chloride flush  3 mL Intravenous Q12H   Continuous Infusions: . cefTRIAXone (ROCEPHIN)  IV       Pamella Pertostin Mischele Detter, MD, PhD [facility] Pager [phone]  If 7PM-7AM,  please contact night-coverage [email or web address] Password [email or web address] 04/20/2017, 12:25 PM

## 2017-04-20 NOTE — Progress Notes (Signed)
Pharmacy Antibiotic Note  [name] is a 83 y.o. female admitted on 04/19/2017 with UTI.  Pharmacy has been consulted for Rocephin dosing.  Rocephin 1 gm given in ED at 2315.   Plan:   Rocephin 1 gm IV q24 Pharmacy to sign off  Height: 5\' 4"  (162.6 cm) Weight: 151 lb 14.4 oz (68.9 kg) IBW/kg (Calculated) : 54.7  Temp (24hrs), Avg:98.3 F (36.8 C), Min:98.3 F (36.8 C), Max:98.3 F (36.8 C)   Recent Labs Lab 04/19/17 2047 04/19/17 2100 04/20/17 0126  WBC 3.1*  --   --   CREATININE 1.37*  --   --   LATICACIDVEN  --  2.84* 1.54    Estimated Creatinine Clearance: 30.2 mL/min (A) (by C-G formula based on SCr of 1.37 mg/dL (H)).    No Known Allergies  Thank you for allowing pharmacy to be a part of this patient's care.  Herby AbrahamMichelle T. Alysia Scism, Pharm.D. [phone] 04/20/2017 1:46 AM

## 2017-04-20 NOTE — Progress Notes (Signed)
CRITICAL VALUE ALERT  Critical Value:  Troponin 1.66  Date & Time Notied:  04/20/17 0345  Provider Notified: Tama GanderKatherine Schorr NP  Orders Received/Actions taken: awaiting new orders

## 2017-04-20 NOTE — Evaluation (Addendum)
Clinical/Bedside Swallow Evaluation Patient Details  Name: [name] MRN: [id number] Date of Birth: 1933/09/14  Today's Date: 04/20/2017 Time: SLP Start Time (ACUTE ONLY): 1540 SLP Stop Time (ACUTE ONLY): 1555 SLP Time Calculation (min) (ACUTE ONLY): 15 min  Past Medical History:  Past Medical History:  Diagnosis Date  . CAD (coronary artery disease)    a. Possible dx -  Lexiscan myoview (9/11): EF 76%, normal wall motion, possible small anterior MI with mild peri-infarct ischemia but cannot rule out breast attenuation, managed conservatively.  . Cerebrovascular accident, embolic (HCC)    left frontal  . Chronic atrial fibrillation (HCC)    a. Chronic. Has had cardioembolic right renal infarct in 12/06 and cardioembolic event to the right arm requiring right brachial embolectomy. Both events occurred while subtherapeutic on coumadin. b. Slow VR 12/2013.  . Diastolic HF (heart failure) (HCC)    Echo (7/11): EF 60-65%, moderate LVH, severe diastolic dysfunction.   . Dyslipidemia   . HTN (hypertension)    a. Resistant HTN. Patient states that this has been poorly controlled for years.   . Iron deficiency anemia   . Memory loss   . Obese   . PAD (peripheral artery disease) (HCC)    a. peripheral arterial dopplers (5/12) with right mid popliteal artery occlusion with reconstitution of arteries below the knees by collaterals. Only mild reduction in ABI on the right (not tissue-threatening).   Past Surgical History:  Past Surgical History:  Procedure Laterality Date  . EMBOLECTOMY     right brachial embolectomy   HPI:  Pt is an 83 y.o. female with PMH of Atrial Fibchronic anticoagulation, dementia , Hyperlipidemia, COPD, history of CVA, history of diastolic CHF or echogram in 2011,PAD. Presented to ED on 7/28 with worsening confusion and foul-smelling urine. hasn't been taking home meds for at least a week. Dementia slowly progressing but "much worse" past week. Head CT showed nothing  acute. CXR showed elevation of left hemidiaphragm with colonic interposition and left lung base atelectasis, no acute change from prior study. Concern for dehydration that may be secondary to decreased PO intake. Bedside swallow eval ordered.     Assessment / Plan / Recommendation Clinical Impression  Pt showed no overt s/s of aspiration during trials at bedside, no change in vocal quality. Pt was pleasantly confused during evaluation and inconsistently following 1-step commands. Pt with only a few teeth and had significantly prolonged mastication with regular solid. Pt does need assistance for feeding at this time with using utensils; dependency for feeding and cognitive deficits increase the risk of aspiration. Recommend dysphagia 3 diet, thin liquids, meds whole with liquid, supervision during meals to assist with self- feeding. Plan to f/u x1 for diet tolerance. SLP Visit Diagnosis: Dysphagia, unspecified (R13.10)    Aspiration Risk  Mild aspiration risk    Diet Recommendation Dysphagia 3 (Mech soft);Thin liquid   Liquid Administration via: Cup;Straw Medication Administration: Whole meds with liquid Supervision: Staff to assist with self feeding Compensations: Minimize environmental distractions;Slow rate;Small sips/bites Postural Changes: Seated upright at 90 degrees    Other  Recommendations Oral Care Recommendations: Oral care BID   Follow up Recommendations 24 hour supervision/assistance      Frequency and Duration min 1 x/week  1 week       Prognosis Prognosis for Safe Diet Advancement: Fair Barriers to Reach Goals: Cognitive deficits;Other (Comment) (dentition)      Swallow Study   General HPI: Pt is an 83 y.o. female with PMH of Atrial  Fibchronic anticoagulation, dementia , Hyperlipidemia, COPD, history of CVA, history of diastolic CHF or echogram in 2011,PAD. Presented to ED on 7/28 with worsening confusion and foul-smelling urine. hasn't been taking home meds for at  least a week. Dementia slowly progressing but "much worse" past week. Head CT showed nothing acute. CXR showed elevation of left hemidiaphragm with colonic interposition and left lung base atelectasis, no acute change from prior study. Concern for dehydration that may be secondary to decreased PO intake. Bedside swallow eval ordered.   Type of Study: Bedside Swallow Evaluation Previous Swallow Assessment: none in chart Diet Prior to this Study: Regular;Thin liquids Temperature Spikes Noted: No History of Recent Intubation: No Behavior/Cognition: Alert;Cooperative;Pleasant mood;Confused;Requires cueing Oral Cavity Assessment: Within Functional Limits Oral Cavity - Dentition: Poor condition;Missing dentition Self-Feeding Abilities: Needs assist Patient Positioning: Upright in bed Baseline Vocal Quality: Normal Volitional Cough: Cognitively unable to elicit Volitional Swallow: Unable to elicit    Oral/Motor/Sensory Function Overall Oral Motor/Sensory Function:  (not following directions for OME)   Ice Chips Ice chips: Not tested   Thin Liquid Thin Liquid: Within functional limits Presentation: Cup;Straw    Nectar Thick Nectar Thick Liquid: Not tested   Honey Thick Honey Thick Liquid: Not tested   Puree Puree: Within functional limits   Solid   GO   Solid: Impaired Oral Phase Impairments: Impaired mastication Oral Phase Functional Implications: Impaired mastication        Bonnie Peery Cecille AverK Jossilyn Benda, MA, CCC-SLP 04/20/2017,3:59 PM [phone]

## 2017-04-20 NOTE — Progress Notes (Signed)
CRITICAL VALUE ALERT  Critical Value:  Troponin 1.77  Date & Time Notied:  04/20/2017 1450  Provider Notified: Dr. Elvera LennoxGherghe  Orders Received/Actions taken:  MD aware

## 2017-04-21 LAB — BASIC METABOLIC PANEL
Anion gap: 13 (ref 5–15)
BUN: 21 mg/dL — AB (ref 6–20)
CALCIUM: 9.5 mg/dL (ref 8.9–10.3)
CO2: 21 mmol/L — AB (ref 22–32)
CREATININE: 1.03 mg/dL — AB (ref 0.44–1.00)
Chloride: 108 mmol/L (ref 101–111)
GFR calc non Af Amer: 49 mL/min — ABNORMAL LOW (ref >60)
GFR, EST AFRICAN AMERICAN: 57 mL/min — AB (ref >60)
Glucose, Bld: 107 mg/dL — ABNORMAL HIGH (ref 65–99)
Potassium: 4.1 mmol/L (ref 3.5–5.1)
SODIUM: 142 mmol/L (ref 135–145)

## 2017-04-21 LAB — CBC
HCT: 43.5 % (ref 36.0–46.0)
Hemoglobin: 14.7 g/dL (ref 12.0–15.0)
MCH: 30.2 pg (ref 26.0–34.0)
MCHC: 33.8 g/dL (ref 30.0–36.0)
MCV: 89.3 fL (ref 78.0–100.0)
PLATELETS: 142 10*3/uL — AB (ref 150–400)
RBC: 4.87 MIL/uL (ref 3.87–5.11)
RDW: 13.3 % (ref 11.5–15.5)
WBC: 3.8 10*3/uL — AB (ref 4.0–10.5)

## 2017-04-21 MED ORDER — METOPROLOL TARTRATE 25 MG PO TABS
25.0000 mg | ORAL_TABLET | Freq: Two times a day (BID) | ORAL | Status: DC
  Administered 2017-04-21 – 2017-04-22 (×2): 25 mg via ORAL
  Filled 2017-04-21 (×2): qty 1

## 2017-04-21 MED ORDER — METOPROLOL TARTRATE 25 MG PO TABS
12.5000 mg | ORAL_TABLET | Freq: Once | ORAL | Status: AC
  Administered 2017-04-21: 12.5 mg via ORAL
  Filled 2017-04-21: qty 1

## 2017-04-21 MED ORDER — AMLODIPINE BESYLATE 5 MG PO TABS
5.0000 mg | ORAL_TABLET | Freq: Every day | ORAL | Status: DC
  Administered 2017-04-21 – 2017-04-23 (×3): 5 mg via ORAL
  Filled 2017-04-21 (×3): qty 1

## 2017-04-21 NOTE — Progress Notes (Signed)
  Speech Language Pathology Treatment: Dysphagia  Patient Details Name: [name] MRN: [id number] DOB: 1932-10-09 Today's Date: 04/21/2017 Time: [id number] SLP Time Calculation (min) (ACUTE ONLY): 10 min  Assessment / Plan / Recommendation Clinical Impression  Pt observed consuming single bolus of magic cup and 3 cup sips of cranberry juice.  No indication of aspiration.  Nearly full lunch tray at bedside  With meat, potatoes, green beans untouched.  Concern for pt nutritional status given her lack of intake.  Pt has very few (?2) teeth and this is concerning for ability to masticate.  She declined to consume solids with this SLP.  RN reports pt refusing po also - Will modify diet for increased efficiency in swallow in hopes to increase intake.  RN agrees to plan.    HPI HPI: Pt is an 83 y.o. female with PMH of Atrial Fibchronic anticoagulation, dementia , Hyperlipidemia, COPD, history of CVA, history of diastolic CHF or echogram in 2011,PAD. Presented to ED on 7/28 with worsening confusion and foul-smelling urine. hasn't been taking home meds for at least a week. Dementia slowly progressing but "much worse" past week. Head CT showed nothing acute. CXR showed elevation of left hemidiaphragm with colonic interposition and left lung base atelectasis, no acute change from prior study. Concern for dehydration that may be secondary to decreased PO intake. Bedside swallow eval ordered.        SLP Plan  Continue with current plan of care       Recommendations  Diet recommendations: Dysphagia 2 (fine chop);Thin liquid Liquids provided via: Cup;Straw Medication Administration: Whole meds with puree Supervision: Full supervision/cueing for compensatory strategies;Staff to assist with self feeding Compensations: Slow rate;Small sips/bites Postural Changes and/or Swallow Maneuvers: Seated upright 90 degrees;Upright 30-60 min after meal                Oral Care Recommendations: Oral care  BID Follow up Recommendations: Skilled Nursing facility Plan: Continue with current plan of care       GO              [name], MS [facility] SLP [phone]   [name], Lincoln Kleiner Ann 04/21/2017, 3:28 PM

## 2017-04-21 NOTE — Evaluation (Signed)
Physical Therapy Evaluation Patient Details Name: [name] MRN: [id number] DOB: 09/20/1933 Today's Date: 04/21/2017   History of Present Illness  83 y.o. female with medical history significant of Atrial Fib chronic anticoagulation, dementia , Hyperlipidemia, COPD, history of CVA, history of diastolic CHF or echogram in 2011, PAD admitted with confusion. Dx of UTI, sepsis, AKI.   Clinical Impression  Pt admitted with above diagnosis. Pt currently with functional limitations due to the deficits listed below (see PT Problem List). Pt ambulated 60' with RW, HR up to 160 with walking, 111 with seated rest immediately following walk. No family present to provide prior functional level. Pt not oriented to self, cannot follow commands. 24* assistance recommended following acute DC.  Pt will benefit from skilled PT to increase their independence and safety with mobility to allow discharge to the venue listed below.       Follow Up Recommendations Home health PT;Supervision/Assistance - 24 hour; SNF if family cannot provide 24* supervision    Equipment Recommendations  None recommended by PT    Recommendations for Other Services       Precautions / Restrictions Precautions Precautions: Fall Precaution Comments: monitor HR Restrictions Weight Bearing Restrictions: No      Mobility  Bed Mobility Overal bed mobility: Needs Assistance Bed Mobility: Supine to Sit     Supine to sit: Mod assist     General bed mobility comments: assist to initiate movement, mod A to raise trunk  Transfers Overall transfer level: Needs assistance Equipment used: Rolling walker (2 wheeled) Transfers: Sit to/from Stand Sit to Stand: Min assist;From elevated surface         General transfer comment: min A to rise  Ambulation/Gait Ambulation/Gait assistance: Min assist;Min guard Ambulation Distance (Feet): 60 Feet Assistive device: Rolling walker (2 wheeled) Gait Pattern/deviations: Step-through  pattern;Decreased step length - right;Decreased step length - left     General Gait Details: min/guard for balance, HR up to 160 walking, 111 at rest immediately after walking, min A to steer RW with turns, no LOB  Stairs            Wheelchair Mobility    Modified Rankin (Stroke Patients Only)       Balance Overall balance assessment: Needs assistance   Sitting balance-Leahy Scale: Fair       Standing balance-Leahy Scale: Fair                               Pertinent Vitals/Pain Faces Pain Scale: No hurt    Home Living Family/patient expects to be discharged to:: Private residence Living Arrangements: Children                    Prior Function           Comments: pt not oriented to self, unable to provide prior function/home info     Hand Dominance        Extremity/Trunk Assessment   Upper Extremity Assessment Upper Extremity Assessment: Difficult to assess due to impaired cognition    Lower Extremity Assessment Lower Extremity Assessment: Difficult to assess due to impaired cognition (cannot follow commands, able to weight bear thru BLEs, no gross limitations in LE ROM noted)    Cervical / Trunk Assessment Cervical / Trunk Assessment: Normal  Communication   Communication: No difficulties  Cognition Arousal/Alertness: Awake/alert Behavior During Therapy: WFL for tasks assessed/performed Overall Cognitive Status: No family/caregiver present to determine baseline  cognitive functioning                                 General Comments: h/o dementia per chart, no family present      General Comments      Exercises     Assessment/Plan    PT Assessment Patient needs continued PT services  PT Problem List Decreased activity tolerance;Cardiopulmonary status limiting activity;Decreased mobility       PT Treatment Interventions DME instruction;Gait training;Functional mobility training;Therapeutic  exercise;Therapeutic activities;Patient/family education    PT Goals (Current goals can be found in the Care Plan section)  Acute Rehab PT Goals PT Goal Formulation: Patient unable to participate in goal setting Time For Goal Achievement: 05/05/17 Potential to Achieve Goals: Good    Frequency Min 3X/week   Barriers to discharge        Co-evaluation               AM-PAC PT "6 Clicks" Daily Activity  Outcome Measure Difficulty turning over in bed (including adjusting bedclothes, sheets and blankets)?: Total Difficulty moving from lying on back to sitting on the side of the bed? : Total Difficulty sitting down on and standing up from a chair with arms (e.g., wheelchair, bedside commode, etc,.)?: Total Help needed moving to and from a bed to chair (including a wheelchair)?: A Little Help needed walking in hospital room?: A Little Help needed climbing 3-5 steps with a railing? : A Lot 6 Click Score: 11    End of Session Equipment Utilized During Treatment: Gait belt Activity Tolerance: Patient tolerated treatment well Patient left: in chair;with call bell/phone within reach;with chair alarm set Nurse Communication: Mobility status PT Visit Diagnosis: Difficulty in walking, not elsewhere classified (R26.2)    Time: [id number] PT Time Calculation (min) (ACUTE ONLY): 20 min   Charges:   PT Evaluation $PT Eval Moderate Complexity: 1 Procedure     PT G CodesTamala Ser:          Maston Wight Kistler 04/21/2017, 10:31 AM [phone]

## 2017-04-21 NOTE — Progress Notes (Addendum)
CSW called pt's grandson at [phone] to attempt to complete pt's assessment.  This phone was listed as the pt's son's phone Hinton RaoAnthony Oliva.  Pt's grandson stated pt's son Ethelene Brownsnthony no longer lived with him but pt's son could be reached at ph: [phone].    CSW called and left a HIPPA-compliant VM asking for a return call.    9:57 PM   CSW received a call from pt's son Ethelene Brownsnthony who stated he desires for the pt to reside in a facility for long-term care at some point.  Pt's son gives verbal permission for CSW Dept to attempt SNF placement at this time - see assessment.  Please reconsult if future social work needs arise.    Dorothe PeaJonathan F. Dayana Dalporto, Francesco SorLCSWA, LCAS, CSI Clinical Social Worker Ph: [phone]

## 2017-04-21 NOTE — Clinical Social Work Note (Signed)
Clinical Social Work Assessment  Patient Details  Name: [name] MRN: [id number] Date of Birth: 1933-02-09  Date of referral:  04/21/17               Reason for consult:  Facility Placement                Permission sought to share information with:    Permission granted to share information::  Yes, Verbal Permission Granted  Name::        Agency::     Relationship::     Contact Information:     Housing/Transportation Living arrangements for the past 2 months:  Single Family Home Source of Information:  Adult Children Patient Interpreter Needed:  None Criminal Activity/Legal Involvement Pertinent to Current Situation/Hospitalization:    Significant Relationships:  Adult Children, Siblings Lives with:    Do you feel safe going back to the place where you live?  No Need for family participation in patient care:  Yes (Comment)  Care giving concerns:  Per pt's son [name] at ph: [phone] pt can not live at home safely.  [name] states one son is severely handicapped and one son is elderly an unable to care for pt.  [name] is attempting to assist pt with pbtaining Medicaid in order to help her get into ALF Memory Care at some point   Social Worker assessment / plan:  *CSW met with pt and confirmed pt's plan to be discharged to SNF if appropritate.  CSW provided active listening and validated pt's son's concerns.   Pt has Dementia per notes and pt's son [name] gave verbal permission for the CSW to complete FL-2 and send referrals out to SNF facilities via the hub per pt's request.  Pt has been living independently with two children, prior to being admitted to [facility].  Employment status:  Retired Forensic scientist:  Medicare PT Recommendations:  [place] / Referral to community resources:     Patient/Family's Response to care:  Patient not alert and oriented.  Patient's son [name] at ph: [phone] agreeable to plan.  Pt's son supportive and  strongly involved in pt.'s care.  Pt.'s son pleasant and appreciated CSW intervention.    Patient/Family's Understanding of and Emotional Response to Diagnosis, Current Treatment, and Prognosis:  *Still assessing  Emotional Assessment Appearance:    Attitude/Demeanor/Rapport:  Unable to Assess Affect (typically observed):  Unable to Assess Orientation:  Fluctuating Orientation (Suspected and/or reported Sundowners) (per notes pt has dementia) Alcohol / Substance use:    Psych involvement (Current and /or in the community):     Discharge Needs  Concerns to be addressed:  No discharge needs identified Readmission within the last 30 days:  No Current discharge risk:  None Barriers to Discharge:   (Pt lives with two sons in their sixties, one of whom is handicapped and per [name] pt is unsafe at home alone)   Claudine Mouton, [place] 04/21/2017, 10:07 PM

## 2017-04-21 NOTE — Progress Notes (Signed)
PROGRESS NOTE  [name] WUJ:[id number] DOB: Apr 10, 1933 DOA: 04/19/2017 PCP: System, Provider Not In   LOS: 2 days   Brief Narrative / Interim history: 83 year old female with history of A. fib on chronic anticoagulation, dementia, hyperlipidemia, COPD, prior CVA, diastolic CHF, was brought to the hospital by family due to confusion more so than her baseline, paranoid ideation in the setting of foul-smelling urine.  Assessment & Plan: Active Problems:   HTN (hypertension)   Chronic diastolic heart failure (HCC)   Acute encephalopathy   Hypokalemia   Dehydration   Acute lower UTI   Atrial fibrillation with RVR (HCC)   AKI (acute kidney injury) (HCC)   Sepsis (HCC)   Sepsis secondary to urinary tract infection -Started empirically on ceftriaxone, blood cultures are pending, continue -Urine culture pending -clinically improving  Acute encephalopathy -improving, pleasant today, eating breakfast  Chronic atrial fibrillation -Rates improve after fluids, continue Xarelto  Acute kidney injury -Due to dehydration in the setting of increased confusion and poor by mouth intake, improved with fluids -Hold lisinopril -Cr improving  Troponin elevation -due to demand ischemia in the setting of A fib with RVR and sepsis -no chest pain  Dementia -Continue Aricept  Hypertension -Hold Norvasc in the setting of sepsis   DVT prophylaxis: Xarelto Code Status: Full code Family Communication: no family at bedside Disposition Plan: SNF  Consultants:   None   Procedures:   None   Antimicrobials:  Ceftriaxone 7/28 >>   Subjective: -pleasantly demented. Smiling. No complaints  Objective: Vitals:   04/20/17 2052 04/21/17 0531 04/21/17 1025 04/21/17 1049  BP: (!) 140/119 (!) 145/89    Pulse: (!) 107 100 (!) 160 93  Resp: (!) 30 (!) 26    Temp: 98 F (36.7 C) 98 F (36.7 C)    TempSrc: Axillary Axillary    SpO2: (!) 85% 99%    Weight:      Height:         Intake/Output Summary (Last 24 hours) at 04/21/17 1314 Last data filed at 04/21/17 0600  Gross per 24 hour  Intake               50 ml  Output              600 ml  Net             -550 ml   Filed Weights   04/20/17 0126  Weight: 68.9 kg (151 lb 14.4 oz)    Examination:  Vitals:   04/20/17 2052 04/21/17 0531 04/21/17 1025 04/21/17 1049  BP: (!) 140/119 (!) 145/89    Pulse: (!) 107 100 (!) 160 93  Resp: (!) 30 (!) 26    Temp: 98 F (36.7 C) 98 F (36.7 C)    TempSrc: Axillary Axillary    SpO2: (!) 85% 99%    Weight:      Height:        Constitutional: NAD Respiratory: CTA biL Cardiovascular: irregular, no MRG Abdomen: non tender   Data Reviewed: I have independently reviewed following labs and imaging studies   CBC:  Recent Labs Lab 04/19/17 2047 04/20/17 0206 04/21/17 0449  WBC 3.1* 3.7* 3.8*  HGB 12.7 13.1 14.7  HCT 38.4 40.0 43.5  MCV 88.5 90.1 89.3  PLT 173 136* 142*   Basic Metabolic Panel:  Recent Labs Lab 04/19/17 2047 04/19/17 2050 04/20/17 0206 04/21/17 0449  NA 142  --  142 142  K 3.4*  --  3.8 4.1  CL 105  --  108 108  CO2 26  --  23 21*  GLUCOSE 90  --  87 107*  BUN 27*  --  20 21*  CREATININE 1.37*  --  1.19* 1.03*  CALCIUM 9.9  --  9.3 9.5  MG  --  2.0 1.8  --   PHOS  --   --  3.4  --    GFR: Estimated Creatinine Clearance: 40.2 mL/min (A) (by C-G formula based on SCr of 1.03 mg/dL (H)). Liver Function Tests:  Recent Labs Lab 04/19/17 2047 04/20/17 0206  AST 26 32  ALT 10* 12*  ALKPHOS 60 56  BILITOT 1.1 0.8  PROT 8.1 7.7  ALBUMIN 3.6 3.4*   No results for input(s): LIPASE, AMYLASE in the last 168 hours. No results for input(s): AMMONIA in the last 168 hours. Coagulation Profile:  Recent Labs Lab 04/19/17 2050  INR 1.24   Cardiac Enzymes:  Recent Labs Lab 04/20/17 0206 04/20/17 0721 04/20/17 1332  TROPONINI 1.66* 1.49* 1.77*   BNP (last 3 results) No results for input(s): PROBNP in the last 8760  hours. HbA1C: No results for input(s): HGBA1C in the last 72 hours. CBG:  Recent Labs Lab 04/19/17 2013  GLUCAP 90   Lipid Profile: No results for input(s): CHOL, HDL, LDLCALC, TRIG, CHOLHDL, LDLDIRECT in the last 72 hours. Thyroid Function Tests:  Recent Labs  04/20/17 0206  TSH 0.047*   Anemia Panel: No results for input(s): VITAMINB12, FOLATE, FERRITIN, TIBC, IRON, RETICCTPCT in the last 72 hours. Urine analysis:    Component Value Date/Time   COLORURINE YELLOW 04/19/2017 2047   APPEARANCEUR CLEAR 04/19/2017 2047   LABSPEC 1.011 04/19/2017 2047   PHURINE 5.0 04/19/2017 2047   GLUCOSEU NEGATIVE 04/19/2017 2047   HGBUR MODERATE (A) 04/19/2017 2047   BILIRUBINUR NEGATIVE 04/19/2017 2047   BILIRUBINUR small 07/29/2016 1103   KETONESUR 5 (A) 04/19/2017 2047   PROTEINUR 30 (A) 04/19/2017 2047   UROBILINOGEN 1.0 07/29/2016 1103   UROBILINOGEN 1.0 04/14/2015 1030   NITRITE POSITIVE (A) 04/19/2017 2047   LEUKOCYTESUR TRACE (A) 04/19/2017 2047   Sepsis Labs: Invalid input(s): PROCALCITONIN, LACTICIDVEN  Recent Results (from the past 240 hour(s))  Blood culture (routine x 2)     Status: None (Preliminary result)   Collection Time: 04/19/17  9:45 PM  Result Value Ref Range Status   Specimen Description BLOOD RIGHT WRIST  Final   Special Requests IN PEDIATRIC BOTTLE Blood Culture adequate volume  Final   Culture   Final    NO GROWTH < 24 HOURS Performed at [facility], [address] [place], [place] [address]    Report Status PENDING  Incomplete      Radiology Studies: Ct Head Wo Contrast  Result Date: 04/19/2017 CLINICAL DATA:  Altered mental status. EXAM: CT HEAD WITHOUT CONTRAST TECHNIQUE: Contiguous axial images were obtained from the base of the skull through the vertex without intravenous contrast. COMPARISON:  CT scan of July 17, 2015. FINDINGS: Brain: Mild chronic ischemic white matter disease is noted. Old left parietal infarction is noted. No  mass effect or midline shift is noted. Ventricular size is within normal limits. There is no evidence of mass lesion, hemorrhage or acute infarction. Vascular: No hyperdense vessel or unexpected calcification. Skull: Normal. Negative for fracture or focal lesion. Sinuses/Orbits: No acute finding. Other: None. IMPRESSION: Mild chronic ischemic white matter disease. Old left parietal infarction. No acute intracranial abnormality seen. Electronically Signed   By: Fayrene FearingJames  Christen ButterGreen Jr, M.D.   On: 04/19/2017 21:42   Dg Chest Portable 1 View  Result Date: 04/19/2017 CLINICAL DATA:  Altered mental status. Increasing dementia over 1 month. Increasing paranoia. EXAM: PORTABLE CHEST 1 VIEW COMPARISON:  04/14/2015 FINDINGS: Shallow inspiration with elevation of the left hemidiaphragm. Colonic interposition under the left hemidiaphragm similar to prior study. Atelectasis in the left lung base. Cardiac enlargement without vascular congestion. No edema or consolidation. No blunting of costophrenic angles. No pneumothorax. Calcified and tortuous aorta. Degenerative changes in the spine and shoulders. IMPRESSION: Elevation of left hemidiaphragm with colonic interposition and left lung base atelectasis. Cardiac enlargement. Findings are unchanged since prior study. No active consolidation or infiltration. Electronically Signed   By: Burman NievesWilliam  Stevens M.D.   On: 04/19/2017 21:09     Scheduled Meds: . donepezil  10 mg Oral QHS  . metoprolol tartrate  12.5 mg Oral BID  . potassium chloride  20 mEq Oral Once  . rivaroxaban  15 mg Oral Q supper  . sodium chloride flush  3 mL Intravenous Q12H   Continuous Infusions: . cefTRIAXone (ROCEPHIN)  IV Stopped (04/20/17 [id number])     Pamella Pertostin Gherghe, MD, PhD [facility] Pager [phone]  If 7PM-7AM, please contact night-coverage [email or web address] Password [email or web address] 04/21/2017, 1:14 PM

## 2017-04-21 NOTE — Evaluation (Signed)
Occupational Therapy Evaluation Patient Details Name: Bonnie QuamMary L Stephens MRN: [id number] DOB: [birth year] Today's Date: 04/21/2017    History of Present Illness 83 y.o. female with medical history significant of Atrial Fib chronic anticoagulation, dementia , Hyperlipidemia, COPD, history of CVA, history of diastolic CHF or echogram in 2011, PAD admitted with confusion. Dx of UTI, sepsis, AKI.    Clinical Impression   Pt admitted with UTI . Pt currently with functional limitations due to the deficits listed below (see OT Problem List).  Pt will benefit from skilled OT to increase their safety and independence with ADL and functional mobility for ADL to facilitate discharge to venue listed below.      Follow Up Recommendations  SNF;Supervision/Assistance - 24 hour    Equipment Recommendations  None recommended by OT    Recommendations for Other Services       Precautions / Restrictions Precautions Precautions: Fall Precaution Comments: monitor HR Restrictions Weight Bearing Restrictions: No      Mobility Bed Mobility Overal bed mobility: Needs Assistance Bed Mobility: Supine to Sit     Supine to sit: Mod assist     General bed mobility comments: assist to initiate movement, mod A to raise trunk  Transfers Overall transfer level: Needs assistance Equipment used: Rolling walker (2 wheeled) Transfers: Sit to/from Stand Sit to Stand: Min assist;From elevated surface         General transfer comment: min A to rise    Balance Overall balance assessment: Needs assistance   Sitting balance-Leahy Scale: Fair       Standing balance-Leahy Scale: Fair                             ADL either performed or assessed with clinical judgement   ADL Overall ADL's : Needs assistance/impaired Eating/Feeding: Minimal assistance;Sitting   Grooming: Minimal assistance;Sitting;Cueing for safety;Cueing for sequencing                                 General  ADL Comments: Pt needed MAX VC with hands on A for grooming with hands on A.  Pt would need significant A with ADL activity. No family present     Vision Patient Visual Report: No change from baseline       Perception     Praxis      Pertinent Vitals/Pain Pain Assessment: No/denies pain Faces Pain Scale: No hurt     Hand Dominance     Extremity/Trunk Assessment Upper Extremity Assessment Upper Extremity Assessment: Generalized weakness (pt able to feed self and wash face)   Lower Extremity Assessment Lower Extremity Assessment: Difficult to assess due to impaired cognition (cannot follow commands, able to weight bear thru BLEs, no gross limitations in LE ROM noted)   Cervical / Trunk Assessment Cervical / Trunk Assessment: Normal   Communication Communication Communication: No difficulties   Cognition Arousal/Alertness: Awake/alert Behavior During Therapy: WFL for tasks assessed/performed Overall Cognitive Status: No family/caregiver present to determine baseline cognitive functioning                                 General Comments: h/o dementia per chart, no family present   General Comments       Exercises     Shoulder Instructions      Home Living Family/patient expects to be discharged  to:: Private residence Living Arrangements: Children                                      Prior Functioning/Environment          Comments: pt not oriented to self, unable to provide prior function/home info        OT Problem List: Decreased strength;Decreased activity tolerance;Decreased knowledge of use of DME or AE;Decreased safety awareness;Impaired balance (sitting and/or standing)      OT Treatment/Interventions: Self-care/ADL training;Energy conservation;Patient/family education;DME and/or AE instruction    OT Goals(Current goals can be found in the care plan section) Acute Rehab OT Goals Patient Stated Goal: could not state OT  Goal Formulation: With patient Time For Goal Achievement: 05/05/17  OT Frequency: Min 2X/week   Barriers to D/C:            Co-evaluation              AM-PAC PT "6 Clicks" Daily Activity     Outcome Measure Help from another person eating meals?: A Lot Help from another person taking care of personal grooming?: A Lot Help from another person toileting, which includes using toliet, bedpan, or urinal?: Total Help from another person bathing (including washing, rinsing, drying)?: A Lot Help from another person to put on and taking off regular upper body clothing?: A Lot Help from another person to put on and taking off regular lower body clothing?: Total 6 Click Score: 10   End of Session Nurse Communication: Mobility status  Activity Tolerance: Patient tolerated treatment well Patient left: in bed;with call bell/phone within reach;with bed alarm set  OT Visit Diagnosis: Unsteadiness on feet (R26.81);Cognitive communication deficit (R41.841)                Time: 1130-1145 OT Time Calculation (min): 15 min Charges:  OT General Charges $OT Visit: 1 Procedure OT Evaluation $OT Eval Moderate Complexity: 1 Procedure G-Codes:     Bonnie AuerLori Matisyn Stephens, OT [phone]  Bonnie CrowEDDING, Jeryl Umholtz D 04/21/2017, 12:00 PM

## 2017-04-22 MED ORDER — LORAZEPAM 2 MG/ML IJ SOLN
0.5000 mg | Freq: Once | INTRAMUSCULAR | Status: AC
  Administered 2017-04-22: 0.5 mg via INTRAVENOUS
  Filled 2017-04-22: qty 1

## 2017-04-22 MED ORDER — METOPROLOL TARTRATE 25 MG PO TABS
12.5000 mg | ORAL_TABLET | Freq: Two times a day (BID) | ORAL | Status: DC
  Administered 2017-04-22 – 2017-04-23 (×2): 12.5 mg via ORAL
  Filled 2017-04-22 (×2): qty 1

## 2017-04-22 NOTE — Progress Notes (Signed)
  Speech Language Pathology Treatment: Dysphagia  Patient Details Name: [name] MRN: [id number] DOB: 28-Oct-1932 Today's Date: 04/22/2017 Time: [id number] SLP Time Calculation (min) (ACUTE ONLY): 13 min  Assessment / Plan / Recommendation Clinical Impression  Limited intake accepted by pt = including single bite of grits, eggs, orange juice, milk and coffee.  Pt with excessive oral manipulation with solids - likely due to having few teeth and dementia but use of puree/liquid aided clearance.  NO s/s of aspiration or severe dysphagia.  If pt continues to not accept po intake, would recommend to consider palliative consult due to nutrition/hydration concerns.  At this time, SLP will sign off, nothing further to add to pt care plan.   HPI HPI: Pt is an 83 y.o. female with PMH of Atrial Fibchronic anticoagulation, dementia , Hyperlipidemia, COPD, history of CVA, history of diastolic CHF or echogram in 2011,PAD. Presented to ED on 7/28 with worsening confusion and foul-smelling urine. hasn't been taking home meds for at least a week. Dementia slowly progressing but "much worse" past week. Head CT showed nothing acute. CXR showed elevation of left hemidiaphragm with colonic interposition and left lung base atelectasis, no acute change from prior study. Concern for dehydration that may be secondary to decreased PO intake. Bedside swallow eval ordered.        SLP Plan  Continue with current plan of care       Recommendations  Diet recommendations: Dysphagia 2 (fine chop);Thin liquid Liquids provided via: Cup;Straw Medication Administration: Whole meds with puree Supervision: Full supervision/cueing for compensatory strategies;Staff to assist with self feeding Compensations: Slow rate;Small sips/bites (use puree/liquid to aid oral clearance of solids) Postural Changes and/or Swallow Maneuvers: Seated upright 90 degrees;Upright 30-60 min after meal                Oral Care Recommendations:  Oral care BID Follow up Recommendations: Skilled Nursing facility Plan: Continue with current plan of care       GO               Bonnie Burnetamara Kaidynce Pfister, MS [facility] SLP [phone]  [name], [name] 04/22/2017, 10:55 AM

## 2017-04-22 NOTE — NC FL2 (Signed)
[place] MEDICAID FL2 LEVEL OF CARE SCREENING TOOL     IDENTIFICATION  Patient Name: [name] Birthdate: 07-02-1933 Sex: female Admission Date (Current Location): 04/19/2017  [facility] [place]   Facility and Address:  [facility],  [address], [place] [address]      Provider Number: [id number]  Attending Physician Name and Address:  Leatha GildingGherghe, Costin M, MD  Relative Name and Phone Number:       Current Level of Care: Hospital Recommended Level of Care: Skilled Nursing Facility Prior Approval Number:    Date Approved/Denied:   PASRR Number: [id number]  Discharge Plan: SNF    Current Diagnoses: Patient Active Problem List   Diagnosis Date Noted  . Dehydration 04/19/2017  . Acute lower UTI 04/19/2017  . Atrial fibrillation with RVR (HCC) 04/19/2017  . AKI (acute kidney injury) (HCC) 04/19/2017  . Sepsis (HCC) 04/19/2017  . Pure hypercholesterolemia 08/27/2016  . Encounter for therapeutic drug monitoring 01/20/2014  . Dementia with behavioral disturbance 01/01/2014  . Leukopenia 12/30/2013  . Abnormal TSH 12/30/2013  . Hypokalemia 12/30/2013  . Acute encephalopathy 12/29/2013  . Atrial fibrillation with slow ventricular response (HCC) 12/29/2013  . Thrombocytopenia, unspecified (HCC) 12/29/2013  . Bradycardia 12/29/2013  . Long term (current) use of anticoagulants 01/24/2011  . Possible CAD - abnl myoview 2011, managed conservatively 01/17/2011  . PAD (peripheral artery disease) (HCC) 01/17/2011  . Chronic diastolic heart failure (HCC) 07/23/2010  . ANEMIA 06/19/2009  . HTN (hypertension) 06/02/2007    Orientation RESPIRATION BLADDER Height & Weight     Self  Normal Continent Weight: 151 lb 14.4 oz (68.9 kg) Height:  5\' 4"  (162.6 cm)  BEHAVIORAL SYMPTOMS/MOOD NEUROLOGICAL BOWEL NUTRITION STATUS  Other (Comment) (agitated, paranoia at admission secondary to dementia exacerbated by UTI)   Continent Diet (dysphagia II)   AMBULATORY STATUS COMMUNICATION OF NEEDS Skin   Limited Assist Verbally Normal                       Personal Care Assistance Level of Assistance  Bathing, Feeding, Dressing Bathing Assistance: Limited assistance Feeding assistance: Independent Dressing Assistance: Limited assistance     Functional Limitations Info  Sight, Hearing, Speech Sight Info: Adequate Hearing Info: Adequate Speech Info: Adequate    SPECIAL CARE FACTORS FREQUENCY  PT (By licensed PT), OT (By licensed OT)     PT Frequency: 5x OT Frequency: 5x            Contractures Contractures Info: Not present    Additional Factors Info  Code Status, Allergies Code Status Info: full Allergies Info: nka           Current Medications (04/22/2017):  This is the current hospital active medication list Current Facility-Administered Medications  Medication Dose Route Frequency Provider Last Rate Last Dose  . acetaminophen (TYLENOL) tablet 650 mg  650 mg Oral Q6H PRN Therisa Doyneoutova, Anastassia, MD       Or  . acetaminophen (TYLENOL) suppository 650 mg  650 mg Rectal Q6H PRN Doutova, Anastassia, MD      . amLODipine (NORVASC) tablet 5 mg  5 mg Oral Daily Leatha GildingGherghe, Costin M, MD   5 mg at 04/21/17 1641  . cefTRIAXone (ROCEPHIN) 1 g in dextrose 5 % 50 mL IVPB  1 g Intravenous Q24H Therisa Doyneoutova, Anastassia, MD   Stopped at 04/21/17 2202  . donepezil (ARICEPT) tablet 10 mg  10 mg Oral QHS Doutova, Anastassia, MD   10 mg at  04/21/17 2132  . HYDROcodone-acetaminophen (NORCO/VICODIN) 5-325 MG per tablet 1-2 tablet  1-2 tablet Oral Q4H PRN Therisa Doyneoutova, Anastassia, MD   1 tablet at 04/21/17 2132  . metoprolol tartrate (LOPRESSOR) tablet 25 mg  25 mg Oral BID Leatha GildingGherghe, Costin M, MD   25 mg at 04/21/17 2132  . ondansetron (ZOFRAN) tablet 4 mg  4 mg Oral Q6H PRN Therisa Doyneoutova, Anastassia, MD       Or  . ondansetron (ZOFRAN) injection 4 mg  4 mg Intravenous Q6H PRN Doutova, Anastassia, MD      . potassium chloride SA (K-DUR,KLOR-CON) CR tablet  20 mEq  20 mEq Oral Once Doutova, Jonny RuizAnastassia, MD      . Rivaroxaban (XARELTO) tablet 15 mg  15 mg Oral Q supper Leatha GildingGherghe, Costin M, MD   15 mg at 04/21/17 1641  . sodium chloride flush (NS) 0.9 % injection 3 mL  3 mL Intravenous Q12H Therisa Doyneoutova, Anastassia, MD   3 mL at 04/21/17 2132     Discharge Medications: Please see discharge summary for a list of discharge medications.  Relevant Imaging Results:  Relevant Lab Results:   Additional Information SS#[id number]  Nelwyn SalisburyMeghan R Dawnell Bryant, LCSW

## 2017-04-22 NOTE — Progress Notes (Signed)
Patient had analgesics and has been cleaned. Still agitated. PCP was notified

## 2017-04-22 NOTE — Progress Notes (Signed)
PROGRESS NOTE  [name] EAV:[id number] DOB: Oct 29, 1932 DOA: 04/19/2017 PCP: System, Provider Not In   LOS: 3 days   Brief Narrative / Interim history: 83 year old female with history of A. fib on chronic anticoagulation, dementia, hyperlipidemia, COPD, prior CVA, diastolic CHF, was brought to the hospital by family due to confusion more so than her baseline, paranoid ideation in the setting of foul-smelling urine.  Patient is currently on IV antibiotics, and wound cultures speciated may be able to be transitioned to p.o.  Family wants to take the patient home with home health.  Assessment & Plan: Active Problems:   HTN (hypertension)   Chronic diastolic heart failure (HCC)   Acute encephalopathy   Hypokalemia   Dehydration   Acute lower UTI   Atrial fibrillation with RVR (HCC)   AKI (acute kidney injury) (HCC)   Sepsis (HCC)   Sepsis secondary to urinary tract infection -Started empirically on ceftriaxone, blood cultures are pending, continue -Urine culture pending, currently with gram-negative, Klebsiella pneumonia, susceptibilities to follow -clinically improving  Acute encephalopathy -improving, pleasant, eating breakfast  Chronic atrial fibrillation -Rates improve after fluids, continue Xarelto -She was started on metoprolol 12.5, increased to 25 yesterday due to elevated rates, however will decrease back the dose today due to rates in the 50s. No pauses  Acute kidney injury -Due to dehydration in the setting of increased confusion and poor by mouth intake, improved with fluids -Hold lisinopril -Creatinine continues to improve today  Troponin elevation -due to demand ischemia in the setting of A fib with RVR and sepsis -no chest pain, asymptomatic -Due to advanced dementia not a candidate for aggressive cardiac evaluation  Dementia -Continue Aricept  Hypertension -Hold Norvasc in the setting of sepsis   DVT prophylaxis: Xarelto Code Status: Full  code Family Communication: no family at bedside, discussed with daughter over the phone Disposition Plan: SNF  Consultants:   None   Procedures:   None   Antimicrobials:  Ceftriaxone 7/28 >>   Subjective: -pleasantly demented. Smiling.  Objective: Vitals:   04/21/17 2130 04/22/17 0408 04/22/17 1210 04/22/17 1425  BP: (!) 149/99 (!) 100/55 (!) 144/106 (!) 133/93  Pulse: 85 (!) 55 93 (!) 52  Resp:  19  16  Temp:  97.7 F (36.5 C)  (!) 97.4 F (36.3 C)  TempSrc:  Oral  Oral  SpO2:  100%    Weight:      Height:        Intake/Output Summary (Last 24 hours) at 04/22/17 1445 Last data filed at 04/21/17 2200  Gross per 24 hour  Intake              110 ml  Output                0 ml  Net              110 ml   Filed Weights   04/20/17 0126  Weight: 68.9 kg (151 lb 14.4 oz)    Examination:  Vitals:   04/21/17 2130 04/22/17 0408 04/22/17 1210 04/22/17 1425  BP: (!) 149/99 (!) 100/55 (!) 144/106 (!) 133/93  Pulse: 85 (!) 55 93 (!) 52  Resp:  19  16  Temp:  97.7 F (36.5 C)  (!) 97.4 F (36.3 C)  TempSrc:  Oral  Oral  SpO2:  100%    Weight:      Height:       Constitutional: NAD Eyes: lids and conjunctivae normal Respiratory: clear to  auscultation bilaterally, no wheezing, no crackles. Normal respiratory effort.  Cardiovascular: irregular, no murmurs / rubs / gallops. No LE edema. 2+ pedal pulses.  Abdomen: no tenderness. Bowel sounds positive.  Skin: no rashes, lesions, ulcers. No induration Neurologic: non focal   Data Reviewed: I have independently reviewed following labs and imaging studies   CBC:  Recent Labs Lab 04/19/17 2047 04/20/17 0206 04/21/17 0449  WBC 3.1* 3.7* 3.8*  HGB 12.7 13.1 14.7  HCT 38.4 40.0 43.5  MCV 88.5 90.1 89.3  PLT 173 136* 142*   Basic Metabolic Panel:  Recent Labs Lab 04/19/17 2047 04/19/17 2050 04/20/17 0206 04/21/17 0449  NA 142  --  142 142  K 3.4*  --  3.8 4.1  CL 105  --  108 108  CO2 26  --  23 21*   GLUCOSE 90  --  87 107*  BUN 27*  --  20 21*  CREATININE 1.37*  --  1.19* 1.03*  CALCIUM 9.9  --  9.3 9.5  MG  --  2.0 1.8  --   PHOS  --   --  3.4  --    GFR: Estimated Creatinine Clearance: 40.2 mL/min (A) (by C-G formula based on SCr of 1.03 mg/dL (H)). Liver Function Tests:  Recent Labs Lab 04/19/17 2047 04/20/17 0206  AST 26 32  ALT 10* 12*  ALKPHOS 60 56  BILITOT 1.1 0.8  PROT 8.1 7.7  ALBUMIN 3.6 3.4*   No results for input(s): LIPASE, AMYLASE in the last 168 hours. No results for input(s): AMMONIA in the last 168 hours. Coagulation Profile:  Recent Labs Lab 04/19/17 2050  INR 1.24   Cardiac Enzymes:  Recent Labs Lab 04/20/17 0206 04/20/17 0721 04/20/17 1332  TROPONINI 1.66* 1.49* 1.77*   BNP (last 3 results) No results for input(s): PROBNP in the last 8760 hours. HbA1C: No results for input(s): HGBA1C in the last 72 hours. CBG:  Recent Labs Lab 04/19/17 2013  GLUCAP 90   Lipid Profile: No results for input(s): CHOL, HDL, LDLCALC, TRIG, CHOLHDL, LDLDIRECT in the last 72 hours. Thyroid Function Tests:  Recent Labs  04/20/17 0206  TSH 0.047*   Anemia Panel: No results for input(s): VITAMINB12, FOLATE, FERRITIN, TIBC, IRON, RETICCTPCT in the last 72 hours. Urine analysis:    Component Value Date/Time   COLORURINE YELLOW 04/19/2017 2047   APPEARANCEUR CLEAR 04/19/2017 2047   LABSPEC 1.011 04/19/2017 2047   PHURINE 5.0 04/19/2017 2047   GLUCOSEU NEGATIVE 04/19/2017 2047   HGBUR MODERATE (A) 04/19/2017 2047   BILIRUBINUR NEGATIVE 04/19/2017 2047   BILIRUBINUR small 07/29/2016 1103   KETONESUR 5 (A) 04/19/2017 2047   PROTEINUR 30 (A) 04/19/2017 2047   UROBILINOGEN 1.0 07/29/2016 1103   UROBILINOGEN 1.0 04/14/2015 1030   NITRITE POSITIVE (A) 04/19/2017 2047   LEUKOCYTESUR TRACE (A) 04/19/2017 2047   Sepsis Labs: Invalid input(s): PROCALCITONIN, LACTICIDVEN  Recent Results (from the past 240 hour(s))  Culture, Urine     Status:  Abnormal (Preliminary result)   Collection Time: 04/19/17  8:47 PM  Result Value Ref Range Status   Specimen Description URINE, RANDOM  Final   Special Requests NONE  Final   Culture (A)  Final    >=100,000 COLONIES/mL KLEBSIELLA PNEUMONIAE SUSCEPTIBILITIES TO FOLLOW Performed at [facility], [address] [place], [place] [address]    Report Status PENDING  Incomplete  Blood culture (routine x 2)     Status: None (Preliminary result)   Collection Time:  04/19/17  9:45 PM  Result Value Ref Range Status   Specimen Description BLOOD RIGHT WRIST  Final   Special Requests IN PEDIATRIC BOTTLE Blood Culture adequate volume  Final   Culture   Final    NO GROWTH 1 DAY Performed at [facility], [address], [place], [place] [address]    Report Status PENDING  Incomplete  Blood culture (routine x 2)     Status: None (Preliminary result)   Collection Time: 04/20/17  8:00 AM  Result Value Ref Range Status   Specimen Description BLOOD LEFT ARM  Final   Special Requests IN PEDIATRIC BOTTLE Blood Culture adequate volume  Final   Culture   Final    NO GROWTH < 24 HOURS Performed at [facility], [address] [place], [place] [address]    Report Status PENDING  Incomplete      Radiology Studies: No results found.   Scheduled Meds: . amLODipine  5 mg Oral Daily  . donepezil  10 mg Oral QHS  . metoprolol tartrate  25 mg Oral BID  . potassium chloride  20 mEq Oral Once  . rivaroxaban  15 mg Oral Q supper  . sodium chloride flush  3 mL Intravenous Q12H   Continuous Infusions: . cefTRIAXone (ROCEPHIN)  IV Stopped (04/21/17 2202)     Pamella Pert, MD, PhD [facility] Pager [phone]  If 7PM-7AM, please contact night-coverage [email or web address] Password [email or web address] 04/22/2017, 2:45 PM

## 2017-04-23 ENCOUNTER — Inpatient Hospital Stay (HOSPITAL_COMMUNITY): Payer: Medicare Other

## 2017-04-23 DIAGNOSIS — I360 Nonrheumatic tricuspid (valve) stenosis: Secondary | ICD-10-CM

## 2017-04-23 DIAGNOSIS — N390 Urinary tract infection, site not specified: Secondary | ICD-10-CM

## 2017-04-23 LAB — ECHOCARDIOGRAM COMPLETE
HEIGHTINCHES: 64 in
Weight: 2430.35 oz

## 2017-04-23 LAB — BASIC METABOLIC PANEL
ANION GAP: 9 (ref 5–15)
BUN: 29 mg/dL — ABNORMAL HIGH (ref 6–20)
CALCIUM: 9.2 mg/dL (ref 8.9–10.3)
CO2: 27 mmol/L (ref 22–32)
Chloride: 107 mmol/L (ref 101–111)
Creatinine, Ser: 1.2 mg/dL — ABNORMAL HIGH (ref 0.44–1.00)
GFR, EST AFRICAN AMERICAN: 47 mL/min — AB (ref >60)
GFR, EST NON AFRICAN AMERICAN: 41 mL/min — AB (ref >60)
GLUCOSE: 86 mg/dL (ref 65–99)
POTASSIUM: 3.6 mmol/L (ref 3.5–5.1)
SODIUM: 143 mmol/L (ref 135–145)

## 2017-04-23 LAB — CBC
HCT: 37.4 % (ref 36.0–46.0)
Hemoglobin: 12.5 g/dL (ref 12.0–15.0)
MCH: 30.1 pg (ref 26.0–34.0)
MCHC: 33.4 g/dL (ref 30.0–36.0)
MCV: 90.1 fL (ref 78.0–100.0)
PLATELETS: 150 10*3/uL (ref 150–400)
RBC: 4.15 MIL/uL (ref 3.87–5.11)
RDW: 13.6 % (ref 11.5–15.5)
WBC: 5.4 10*3/uL (ref 4.0–10.5)

## 2017-04-23 LAB — URINE CULTURE: Culture: >=100000 — AB

## 2017-04-23 LAB — T4, FREE: FREE T4: 1.73 ng/dL — AB (ref 0.61–1.12)

## 2017-04-23 MED ORDER — CEFUROXIME AXETIL 250 MG PO TABS
250.0000 mg | ORAL_TABLET | Freq: Two times a day (BID) | ORAL | Status: DC
  Filled 2017-04-23 (×2): qty 1

## 2017-04-23 MED ORDER — CEFUROXIME AXETIL 250 MG PO TABS: 250.0000 mg | ORAL_TABLET | Freq: Two times a day (BID) | ORAL | 0 refills | Status: DC

## 2017-04-23 MED ORDER — PERFLUTREN LIPID MICROSPHERE
1.0000 mL | INTRAVENOUS | Status: AC | PRN
  Administered 2017-04-23: 2 mL via INTRAVENOUS
  Filled 2017-04-23: qty 10

## 2017-04-23 MED ORDER — PERFLUTREN LIPID MICROSPHERE
INTRAVENOUS | Status: AC
  Filled 2017-04-23: qty 10

## 2017-04-23 MED ORDER — METOPROLOL TARTRATE 25 MG PO TABS: 12.5000 mg | ORAL_TABLET | Freq: Two times a day (BID) | ORAL | 0 refills | Status: DC

## 2017-04-23 NOTE — Progress Notes (Signed)
Occupational Therapy Treatment Patient Details Name: [name] MRN: [id number] DOB: 1933-02-16 Today's Date: 04/23/2017    History of present illness 83 y.o. female with medical history significant of Atrial Fib chronic anticoagulation, dementia , Hyperlipidemia, COPD, history of CVA, history of diastolic CHF or echogram in 2011, PAD admitted with confusion. Dx of UTI, sepsis, AKI.       Follow Up Recommendations  SNF;Supervision/Assistance - 24 hour    Equipment Recommendations  None recommended by OT       Precautions / Restrictions Precautions Precautions: Fall Precaution Comments: monitor HR,, incontinent       Mobility Bed Mobility Overal bed mobility: Needs Assistance Bed Mobility: Supine to Sit;Sit to Supine     Supine to sit: Mod assist Sit to supine: Mod assist   General bed mobility comments: all mobility required tactile cues and  physical assistance to move to sitting and to place legsonto bed. no initiation by patient.   Transfers Overall transfer level: Needs assistance Equipment used: Rolling walker (2 wheeled) Transfers: Sit to/from Stand Sit to Stand: Mod assist         General transfer comment: did not perform    Balance Overall balance assessment: Needs assistance   Sitting balance-Leahy Scale: Fair     Standing balance support: Bilateral upper extremity supported;During functional activity Standing balance-Leahy Scale: Fair                             ADL either performed or assessed with clinical judgement   ADL Overall ADL's : Needs assistance/impaired Eating/Feeding: Moderate assistance Eating/Feeding Details (indicate cue type and reason): mod- max A with folk and spoon but min A with cup this OT session. Increased time needed.  Pt does not seem to follow verbal commands, but did respond well to hand over hand guiding.  Grooming: Minimal assistance;Sitting;Cueing for safety;Cueing for sequencing;Moderate assistance                                        Vision Patient Visual Report: No change from baseline            Cognition Arousal/Alertness: Awake/alert Behavior During Therapy: WFL for tasks assessed/performed Overall Cognitive Status: No family/caregiver present to determine baseline cognitive functioning                                 General Comments: h/o dementia per chart, no family present, patient follows no verbal commands. Requires tactile and multimodal cues for mobility, standing and sitting down        Exercises     Shoulder Instructions       General Comments      Pertinent Vitals/ Pain       Faces Pain Scale: No hurt         Frequency  Min 2X/week        Progress Toward Goals  OT Goals(current goals can now be found in the care plan section)  Progress towards OT goals: OT to reassess next treatment     Plan Discharge plan remains appropriate       AM-PAC PT "6 Clicks" Daily Activity     Outcome Measure   Help from another person eating meals?: A Lot Help from another person taking care of personal grooming?: A  Lot Help from another person toileting, which includes using toliet, bedpan, or urinal?: Total Help from another person bathing (including washing, rinsing, drying)?: A Lot Help from another person to put on and taking off regular upper body clothing?: A Lot Help from another person to put on and taking off regular lower body clothing?: Total 6 Click Score: 10    End of Session    OT Visit Diagnosis: Unsteadiness on feet (R26.81);Cognitive communication deficit (R41.841);Muscle weakness (generalized) (M62.81)   Activity Tolerance Patient tolerated treatment well   Patient Left in bed;with call bell/phone within reach;with bed alarm set   Nurse Communication Mobility status        Time: [id number] OT Time Calculation (min): 16 min  Charges: OT General Charges $OT Visit: 1 Procedure OT Treatments $Self  Care/Home Management : 8-22 mins  NilwoodLori Breianna Delfino, [place] [phone]   Einar CrowEDDING, Elissa Grieshop D 04/23/2017, 2:29 PM

## 2017-04-23 NOTE — Discharge Summary (Addendum)
Physician Discharge Summary  [name] FAO:[id number] DOB: 10/23/1932 DOA: 04/19/2017  PCP: System, Provider Not In  Admit date: 04/19/2017 Discharge date: 04/23/2017  Admitted From: Home  Disposition: SNF  Recommendations for Outpatient Follow-up:  1. Follow up with PCP in 1-2 weeks 2. Please obtain BMP/CBC in one week 3. Please follow Free T 3 and T 4. Might need referral to endocrinologist      Discharge Condition: stable.  CODE STATUS: Full code.  Diet recommendation: Dysphagia 2 diet   Brief/Interim Summary: 83 year old female with history of A. fib on chronic anticoagulation, dementia, hyperlipidemia, COPD, prior CVA, diastolic CHF, was brought to the hospital by family due to confusion more so than her baseline, paranoid ideation in the setting of foul-smelling urine.  Patient is currently on IV antibiotics, and wound cultures speciated may be able to be transitioned to p.o.  Family wants to take the patient home with home health.  Assessment & Plan: Active Problems:   HTN (hypertension)   Chronic diastolic heart failure (HCC)   Acute encephalopathy   Hypokalemia   Dehydration   Acute lower UTI   Atrial fibrillation with RVR (HCC)   AKI (acute kidney injury) (HCC)   Sepsis (HCC)   Sepsis secondary to urinary tract infection -Started empirically on ceftriaxone, blood cultures no growth to date,  -Urine culture ; Klebsiella pneumonia, discharge on ceftin for 5 more days.  -clinically improving  Acute encephalopathy -improving, pleasant. At baseline per family   Low TSH; please follow Free T 3 and T4.  Need referral to endocrinologist   Chronic atrial fibrillation -Rates improve after fluids, continue Xarelto -She was started on metoprolol 12.5, increased to 25 yesterday due to elevated rates, however will decrease back the dose today due to rates in the 50s. No pauses  Acute kidney injury -Due to dehydration in the setting of increased confusion and poor  by mouth intake, improved with fluids -Hold lisinopril at discharge.  -Creatinine continues to improve today  Troponin elevation -due to demand ischemia in the setting of A fib with RVR and sepsis -no chest pain, asymptomatic -Due to advanced dementia not a candidate for aggressive cardiac evaluation -check ECHO , normal EF. Hypokinesis. If renal function continue to be stable consider resuming lisinopril.   Dementia -Continue Aricept  Hypertension -resume norvasc at discharge   Discharge Diagnoses:  Active Problems:   HTN (hypertension)   Chronic diastolic heart failure (HCC)   Acute encephalopathy   Hypokalemia   Dehydration   Acute lower UTI   Atrial fibrillation with RVR (HCC)   AKI (acute kidney injury) (HCC)   Sepsis [facility](HCC)    Discharge Instructions  Discharge Instructions    Diet - low sodium heart healthy    Complete by:  As directed    Increase activity slowly    Complete by:  As directed      Allergies as of 04/23/2017   No Known Allergies     Medication List    STOP taking these medications   lisinopril 40 MG tablet Commonly known as:  PRINIVIL,ZESTRIL     TAKE these medications   amLODipine 10 MG tablet Commonly known as:  NORVASC Take 1 tablet (10 mg total) by mouth daily.   atorvastatin 80 MG tablet Commonly known as:  LIPITOR TAKE 1 TABLET BY MOUTH DAILY AT 6 PM.   cefUROXime 250 MG tablet Commonly known as:  CEFTIN Take 1 tablet (250 mg total) by mouth 2 (two) times daily with a  meal.   donepezil 10 MG tablet Commonly known as:  ARICEPT Take 1 tablet (10 mg total) by mouth at bedtime.   metoprolol tartrate 25 MG tablet Commonly known as:  LOPRESSOR Take 0.5 tablets (12.5 mg total) by mouth 2 (two) times daily.   rivaroxaban 20 MG Tabs tablet Commonly known as:  XARELTO Take 1 tablet (20 mg total) by mouth daily with supper.   sodium chloride 0.65 % Soln nasal spray Commonly known as:  OCEAN Place 1 spray into both nostrils 3  (three) times daily as needed for congestion.   vitamin B-12 1000 MCG tablet Commonly known as:  CYANOCOBALAMIN Take 1,000 mcg by mouth daily. Reported on 11/27/2015       No Known Allergies  Consultations:  none   Procedures/Studies: Ct Head Wo Contrast  Result Date: 04/19/2017 CLINICAL DATA:  Altered mental status. EXAM: CT HEAD WITHOUT CONTRAST TECHNIQUE: Contiguous axial images were obtained from the base of the skull through the vertex without intravenous contrast. COMPARISON:  CT scan of July 17, 2015. FINDINGS: Brain: Mild chronic ischemic white matter disease is noted. Old left parietal infarction is noted. No mass effect or midline shift is noted. Ventricular size is within normal limits. There is no evidence of mass lesion, hemorrhage or acute infarction. Vascular: No hyperdense vessel or unexpected calcification. Skull: Normal. Negative for fracture or focal lesion. Sinuses/Orbits: No acute finding. Other: None. IMPRESSION: Mild chronic ischemic white matter disease. Old left parietal infarction. No acute intracranial abnormality seen. Electronically Signed   By: Lupita Raider, M.D.   On: 04/19/2017 21:42   Dg Chest Portable 1 View  Result Date: 04/19/2017 CLINICAL DATA:  Altered mental status. Increasing dementia over 1 month. Increasing paranoia. EXAM: PORTABLE CHEST 1 VIEW COMPARISON:  04/14/2015 FINDINGS: Shallow inspiration with elevation of the left hemidiaphragm. Colonic interposition under the left hemidiaphragm similar to prior study. Atelectasis in the left lung base. Cardiac enlargement without vascular congestion. No edema or consolidation. No blunting of costophrenic angles. No pneumothorax. Calcified and tortuous aorta. Degenerative changes in the spine and shoulders. IMPRESSION: Elevation of left hemidiaphragm with colonic interposition and left lung base atelectasis. Cardiac enlargement. Findings are unchanged since prior study. No active consolidation or  infiltration. Electronically Signed   By: Burman Nieves M.D.   On: 04/19/2017 21:09     Subjective: Patient smile, laugh. She doesn't speak a lot.  Daughter at bedside report patient is back to baseline.   Discharge Exam: Vitals:   04/22/17 2056 04/23/17 0428  BP: 133/76 133/65  Pulse: (!) 54 (!) 59  Resp: 16 18  Temp: 98.2 F (36.8 C) 98.7 F (37.1 C)   Vitals:   04/22/17 1210 04/22/17 1425 04/22/17 2056 04/23/17 0428  BP: (!) 144/106 (!) 133/93 133/76 133/65  Pulse: 93 (!) 52 (!) 54 (!) 59  Resp:  16 16 18   Temp:  (!) 97.4 F (36.3 C) 98.2 F (36.8 C) 98.7 F (37.1 C)  TempSrc:  Oral Oral Oral  SpO2:   100% 100%  Weight:      Height:        General: Pt is alert, awake, not in acute distress,  Cardiovascular: RRR, S1/S2 +, no rubs, no gallops Respiratory: CTA bilaterally, no wheezing, no rhonchi Abdominal: Soft, NT, ND, bowel sounds + Extremities: no edema, no cyanosis    The results of significant diagnostics from this hospitalization (including imaging, microbiology, ancillary and laboratory) are listed below for reference.     Microbiology: Recent  Results (from the past 240 hour(s))  Culture, Urine     Status: Abnormal   Collection Time: 04/19/17  8:47 PM  Result Value Ref Range Status   Specimen Description URINE, RANDOM  Final   Special Requests NONE  Final   Culture >=100,000 COLONIES/mL KLEBSIELLA PNEUMONIAE (A)  Final   Report Status 04/23/2017 FINAL  Final   Organism ID, Bacteria KLEBSIELLA PNEUMONIAE (A)  Final      Susceptibility   Klebsiella pneumoniae - MIC*    AMPICILLIN >=32 RESISTANT Resistant     CEFAZOLIN <=4 SENSITIVE Sensitive     CEFTRIAXONE <=1 SENSITIVE Sensitive     CIPROFLOXACIN <=0.25 SENSITIVE Sensitive     GENTAMICIN <=1 SENSITIVE Sensitive     IMIPENEM <=0.25 SENSITIVE Sensitive     NITROFURANTOIN 64 INTERMEDIATE Intermediate     TRIMETH/SULFA >=320 RESISTANT Resistant     AMPICILLIN/SULBACTAM 8 SENSITIVE Sensitive      PIP/TAZO <=4 SENSITIVE Sensitive     Extended ESBL NEGATIVE Sensitive     * >=100,000 COLONIES/mL KLEBSIELLA PNEUMONIAE  Blood culture (routine x 2)     Status: None (Preliminary result)   Collection Time: 04/19/17  9:45 PM  Result Value Ref Range Status   Specimen Description BLOOD RIGHT WRIST  Final   Special Requests IN PEDIATRIC BOTTLE Blood Culture adequate volume  Final   Culture   Final    NO GROWTH 3 DAYS Performed at [facility], [address] [place], [place] [address]    Report Status PENDING  Incomplete  Blood culture (routine x 2)     Status: None (Preliminary result)   Collection Time: 04/20/17  8:00 AM  Result Value Ref Range Status   Specimen Description BLOOD LEFT ARM  Final   Special Requests IN PEDIATRIC BOTTLE Blood Culture adequate volume  Final   Culture   Final    NO GROWTH 3 DAYS Performed at [facility], [address], [place], [place] [address]    Report Status PENDING  Incomplete     Labs: BNP (last 3 results) No results for input(s): BNP in the last 8760 hours. Basic Metabolic Panel:  Recent Labs Lab 04/19/17 2047 04/19/17 2050 04/20/17 0206 04/21/17 0449 04/23/17 0743  NA 142  --  142 142 143  K 3.4*  --  3.8 4.1 3.6  CL 105  --  108 108 107  CO2 26  --  23 21* 27  GLUCOSE 90  --  87 107* 86  BUN 27*  --  20 21* 29*  CREATININE 1.37*  --  1.19* 1.03* 1.20*  CALCIUM 9.9  --  9.3 9.5 9.2  MG  --  2.0 1.8  --   --   PHOS  --   --  3.4  --   --    Liver Function Tests:  Recent Labs Lab 04/19/17 2047 04/20/17 0206  AST 26 32  ALT 10* 12*  ALKPHOS 60 56  BILITOT 1.1 0.8  PROT 8.1 7.7  ALBUMIN 3.6 3.4*   No results for input(s): LIPASE, AMYLASE in the last 168 hours. No results for input(s): AMMONIA in the last 168 hours. CBC:  Recent Labs Lab 04/19/17 2047 04/20/17 0206 04/21/17 0449 04/23/17 0743  WBC 3.1* 3.7* 3.8* 5.4  HGB 12.7 13.1 14.7 12.5  HCT 38.4 40.0 43.5 37.4  MCV 88.5 90.1 89.3 90.1  PLT 173  136* 142* 150   Cardiac Enzymes:  Recent Labs Lab 04/20/17 0206 04/20/17  [id number] 04/20/17 1332  TROPONINI 1.66* 1.49* 1.77*   BNP: Invalid input(s): POCBNP CBG:  Recent Labs Lab 04/19/17 2013  GLUCAP 90   D-Dimer No results for input(s): DDIMER in the last 72 hours. Hgb A1c No results for input(s): HGBA1C in the last 72 hours. Lipid Profile No results for input(s): CHOL, HDL, LDLCALC, TRIG, CHOLHDL, LDLDIRECT in the last 72 hours. Thyroid function studies No results for input(s): TSH, T4TOTAL, T3FREE, THYROIDAB in the last 72 hours.  Invalid input(s): FREET3 Anemia work up No results for input(s): VITAMINB12, FOLATE, FERRITIN, TIBC, IRON, RETICCTPCT in the last 72 hours. Urinalysis    Component Value Date/Time   COLORURINE YELLOW 04/19/2017 2047   APPEARANCEUR CLEAR 04/19/2017 2047   LABSPEC 1.011 04/19/2017 2047   PHURINE 5.0 04/19/2017 2047   GLUCOSEU NEGATIVE 04/19/2017 2047   HGBUR MODERATE (A) 04/19/2017 2047   BILIRUBINUR NEGATIVE 04/19/2017 2047   BILIRUBINUR small 07/29/2016 1103   KETONESUR 5 (A) 04/19/2017 2047   PROTEINUR 30 (A) 04/19/2017 2047   UROBILINOGEN 1.0 07/29/2016 1103   UROBILINOGEN 1.0 04/14/2015 1030   NITRITE POSITIVE (A) 04/19/2017 2047   LEUKOCYTESUR TRACE (A) 04/19/2017 2047   Sepsis Labs Invalid input(s): PROCALCITONIN,  WBC,  LACTICIDVEN Microbiology Recent Results (from the past 240 hour(s))  Culture, Urine     Status: Abnormal   Collection Time: 04/19/17  8:47 PM  Result Value Ref Range Status   Specimen Description URINE, RANDOM  Final   Special Requests NONE  Final   Culture >=100,000 COLONIES/mL KLEBSIELLA PNEUMONIAE (A)  Final   Report Status 04/23/2017 FINAL  Final   Organism ID, Bacteria KLEBSIELLA PNEUMONIAE (A)  Final      Susceptibility   Klebsiella pneumoniae - MIC*    AMPICILLIN >=32 RESISTANT Resistant     CEFAZOLIN <=4 SENSITIVE Sensitive     CEFTRIAXONE <=1 SENSITIVE Sensitive     CIPROFLOXACIN <=0.25  SENSITIVE Sensitive     GENTAMICIN <=1 SENSITIVE Sensitive     IMIPENEM <=0.25 SENSITIVE Sensitive     NITROFURANTOIN 64 INTERMEDIATE Intermediate     TRIMETH/SULFA >=320 RESISTANT Resistant     AMPICILLIN/SULBACTAM 8 SENSITIVE Sensitive     PIP/TAZO <=4 SENSITIVE Sensitive     Extended ESBL NEGATIVE Sensitive     * >=100,000 COLONIES/mL KLEBSIELLA PNEUMONIAE  Blood culture (routine x 2)     Status: None (Preliminary result)   Collection Time: 04/19/17  9:45 PM  Result Value Ref Range Status   Specimen Description BLOOD RIGHT WRIST  Final   Special Requests IN PEDIATRIC BOTTLE Blood Culture adequate volume  Final   Culture   Final    NO GROWTH 3 DAYS Performed at [facility], [address] [place], [place] [address]    Report Status PENDING  Incomplete  Blood culture (routine x 2)     Status: None (Preliminary result)   Collection Time: 04/20/17  8:00 AM  Result Value Ref Range Status   Specimen Description BLOOD LEFT ARM  Final   Special Requests IN PEDIATRIC BOTTLE Blood Culture adequate volume  Final   Culture   Final    NO GROWTH 3 DAYS Performed at [facility], [address] [place], [place] [address]    Report Status PENDING  Incomplete     Time coordinating discharge: Over 30 minutes  SIGNED:   Alba Coryegalado, Jailynne Opperman A, MD  [facility] 04/23/2017, 4:08 PM Pager   If 7PM-7AM, please contact night-coverage [email or web address] Password [email or web address]

## 2017-04-23 NOTE — Progress Notes (Addendum)
Spoke with pt's son Ethelene Brownsnthony, informed CM and CSW family has agreed upon SNF for ST rehab at DC. Son provided with bed offers, [facility]. [facility] and confirmed bed availability for today. Selected facility in HUB. All information sent to facility via HUB.   Updated attending MD. Pt ready for DC pending receiving results of echocardiogram.   Pt will transport via PTAR- CSW completed medical necessity form and will call for transport.   Report # for RN [phone]  Plan: [facility].   Ilean SkillMeghan Leidi Astle, MSW, LCSW Clinical Social Work 04/23/2017 [phone]  16:42- awaiting review of echo prior to arranging transport- provided unit with after hours transport #[phone] in order to arrange when pt ready.

## 2017-04-23 NOTE — Progress Notes (Signed)
Physical Therapy Treatment Patient Details Name: Bonnie QuamMary L Twaddle MRN: [id number] DOB: 22-Feb-1933 Today's Date: 04/23/2017    History of Present Illness 83 y.o. female with medical history significant of Atrial Fib chronic anticoagulation, dementia , Hyperlipidemia, COPD, history of CVA, history of diastolic CHF or echogram in 2011, PAD admitted with confusion. Dx of UTI, sepsis, AKI.     PT Comments    The patient is pleasant, cooperates, does not follow verbal commands. Ambulates  With RW. HR 133. Per RN, plans to return home. Will require close 24/7 care.   Follow Up Recommendations  Home health PT;Supervision/Assistance - 24 hour;SNF     Equipment Recommendations  None recommended by PT    Recommendations for Other Services       Precautions / Restrictions Precautions Precautions: Fall Precaution Comments: monitor HR,, incontinent    Mobility  Bed Mobility Overal bed mobility: Needs Assistance Bed Mobility: Supine to Sit;Sit to Supine     Supine to sit: Mod assist Sit to supine: Mod assist   General bed mobility comments: all mobility required tactile cues and  physical assistance to move to sitting and to place legsonto bed. no initiation by patient.   Transfers Overall transfer level: Needs assistance Equipment used: Rolling walker (2 wheeled) Transfers: Sit to/from Stand Sit to Stand: Mod assist         General transfer comment: min A to rise, mod to  sit down with tactile cues.  Ambulation/Gait Ambulation/Gait assistance: Min assist Ambulation Distance (Feet): 20 Feet Assistive device: Rolling walker (2 wheeled) Gait Pattern/deviations: Step-through pattern;Decreased step length - right;Decreased step length - left     General Gait Details: min/assist  for balance and steer the RW around the room., HR up to 133 walking, 109 at rest   Stairs            Wheelchair Mobility    Modified Rankin (Stroke Patients Only)       Balance Overall  balance assessment: Needs assistance   Sitting balance-Leahy Scale: Fair     Standing balance support: Bilateral upper extremity supported;During functional activity Standing balance-Leahy Scale: Fair                              Cognition Arousal/Alertness: Awake/alert Behavior During Therapy: WFL for tasks assessed/performed Overall Cognitive Status: No family/caregiver present to determine baseline cognitive functioning                                 General Comments: h/o dementia per chart, no family present, patient follows no verbal commands. Requires tactile and multimodal cues for mobility, standing and sitting down      Exercises      General Comments        Pertinent Vitals/Pain Faces Pain Scale: No hurt    Home Living                      Prior Function            PT Goals (current goals can now be found in the care plan section) Progress towards PT goals: Progressing toward goals    Frequency    Min 3X/week      PT Plan Current plan remains appropriate    Co-evaluation              AM-PAC PT "6 Clicks" Daily Activity  Outcome Measure  Difficulty turning over in bed (including adjusting bedclothes, sheets and blankets)?: Total Difficulty moving from lying on back to sitting on the side of the bed? : Total Difficulty sitting down on and standing up from a chair with arms (e.g., wheelchair, bedside commode, etc,.)?: Total Help needed moving to and from a bed to chair (including a wheelchair)?: A Lot Help needed walking in hospital room?: A Lot Help needed climbing 3-5 steps with a railing? : A Lot 6 Click Score: 9    End of Session Equipment Utilized During Treatment: Gait belt Activity Tolerance: Patient tolerated treatment well Patient left: with call bell/phone within reach;in bed;with bed alarm set Nurse Communication: Mobility status PT Visit Diagnosis: Difficulty in walking, not elsewhere  classified (R26.2)     Time: 1206-1222 PT Time Calculation (min) (ACUTE ONLY): 16 min  Charges:  $Gait Training: 8-22 mins                    G CodesBlanchard Kelch:       Lisanne Ponce PT [phone]   Rada HayHill, Johan Antonacci Elizabeth 04/23/2017, 1:10 PM

## 2017-04-23 NOTE — Progress Notes (Signed)
This CM made several calls to son and daughter's number listed in EPIC, numbers are unavailable. Reached grandson Lianne Curenthony Jr. To ask his father Dewitt Rotanthony, Sr. To call CM.

## 2017-04-23 NOTE — Progress Notes (Signed)
  Echocardiogram 2D Echocardiogram has been performed.  Matheo Rathbone L Androw 04/23/2017, 1:30 PM

## 2017-04-23 NOTE — Care Management Important Message (Signed)
Important Message  Patient Details  Name: [name] MRN: [id number] Date of Birth: June 07, 1933   Medicare Important Message Given:  Yes    Caren MacadamFuller, Andrianna Manalang 04/23/2017, 10:53 AMImportant Message  Patient Details  Name: Bonnie QuamMary L Stephens MRN: [id number] Date of Birth: June 07, 1933   Medicare Important Message Given:  Yes    Caren MacadamFuller, Alexias Margerum 04/23/2017, 10:53 AM

## 2017-04-23 NOTE — Progress Notes (Signed)
Received report from [place], Charity fundraiserN. Agree with previous assessment. Patient currently sleeping. Will continue to monitor patient.

## 2017-04-23 NOTE — Progress Notes (Addendum)
Pt's son [name] here. Spoke with him concerning discharge plans. Plans are to discharge to SNF. CSW to follow. [name] also gave his cell number [phone].

## 2017-04-24 LAB — T3, FREE: T3 FREE: 2.8 pg/mL (ref 2.0–4.4)

## 2017-04-25 LAB — CULTURE, BLOOD (ROUTINE X 2)
Culture: NO GROWTH
Culture: NO GROWTH
SPECIAL REQUESTS: ADEQUATE
Special Requests: ADEQUATE

## 2017-04-27 ENCOUNTER — Inpatient Hospital Stay (HOSPITAL_COMMUNITY)
Admission: EM | Admit: 2017-04-27 | Discharge: 2017-05-01 | DRG: 064 | Disposition: A | Discharge status: Skilled Nursing Facility | Payer: Medicare Other | Attending: Internal Medicine | Admitting: Internal Medicine

## 2017-04-27 ENCOUNTER — Emergency Department (HOSPITAL_COMMUNITY): Payer: Medicare Other

## 2017-04-27 DIAGNOSIS — F0391 Unspecified dementia with behavioral disturbance: Secondary | ICD-10-CM | POA: Diagnosis present

## 2017-04-27 DIAGNOSIS — Z825 Family history of asthma and other chronic lower respiratory diseases: Secondary | ICD-10-CM

## 2017-04-27 DIAGNOSIS — R4701 Aphasia: Secondary | ICD-10-CM | POA: Diagnosis present

## 2017-04-27 DIAGNOSIS — R400 Somnolence: Secondary | ICD-10-CM | POA: Diagnosis not present

## 2017-04-27 DIAGNOSIS — N179 Acute kidney failure, unspecified: Secondary | ICD-10-CM | POA: Diagnosis present

## 2017-04-27 DIAGNOSIS — I4891 Unspecified atrial fibrillation: Secondary | ICD-10-CM | POA: Diagnosis present

## 2017-04-27 DIAGNOSIS — R29721 NIHSS score 21: Secondary | ICD-10-CM | POA: Diagnosis present

## 2017-04-27 DIAGNOSIS — R531 Weakness: Secondary | ICD-10-CM

## 2017-04-27 DIAGNOSIS — I739 Peripheral vascular disease, unspecified: Secondary | ICD-10-CM | POA: Diagnosis present

## 2017-04-27 DIAGNOSIS — G8191 Hemiplegia, unspecified affecting right dominant side: Secondary | ICD-10-CM | POA: Diagnosis present

## 2017-04-27 DIAGNOSIS — Z7901 Long term (current) use of anticoagulants: Secondary | ICD-10-CM

## 2017-04-27 DIAGNOSIS — G9341 Metabolic encephalopathy: Secondary | ICD-10-CM | POA: Diagnosis present

## 2017-04-27 DIAGNOSIS — R64 Cachexia: Secondary | ICD-10-CM | POA: Diagnosis present

## 2017-04-27 DIAGNOSIS — Z6826 Body mass index (BMI) 26.0-26.9, adult: Secondary | ICD-10-CM

## 2017-04-27 DIAGNOSIS — I5032 Chronic diastolic (congestive) heart failure: Secondary | ICD-10-CM | POA: Diagnosis present

## 2017-04-27 DIAGNOSIS — I272 Pulmonary hypertension, unspecified: Secondary | ICD-10-CM | POA: Diagnosis present

## 2017-04-27 DIAGNOSIS — I2510 Atherosclerotic heart disease of native coronary artery without angina pectoris: Secondary | ICD-10-CM | POA: Diagnosis present

## 2017-04-27 DIAGNOSIS — N390 Urinary tract infection, site not specified: Secondary | ICD-10-CM | POA: Diagnosis present

## 2017-04-27 DIAGNOSIS — R748 Abnormal levels of other serum enzymes: Secondary | ICD-10-CM | POA: Diagnosis present

## 2017-04-27 DIAGNOSIS — I63512 Cerebral infarction due to unspecified occlusion or stenosis of left middle cerebral artery: Secondary | ICD-10-CM | POA: Diagnosis not present

## 2017-04-27 DIAGNOSIS — Z7189 Other specified counseling: Secondary | ICD-10-CM

## 2017-04-27 DIAGNOSIS — Z79899 Other long term (current) drug therapy: Secondary | ICD-10-CM

## 2017-04-27 DIAGNOSIS — I110 Hypertensive heart disease with heart failure: Secondary | ICD-10-CM | POA: Diagnosis present

## 2017-04-27 DIAGNOSIS — I639 Cerebral infarction, unspecified: Secondary | ICD-10-CM | POA: Diagnosis present

## 2017-04-27 DIAGNOSIS — Z8249 Family history of ischemic heart disease and other diseases of the circulatory system: Secondary | ICD-10-CM

## 2017-04-27 DIAGNOSIS — R946 Abnormal results of thyroid function studies: Secondary | ICD-10-CM | POA: Diagnosis present

## 2017-04-27 DIAGNOSIS — G9349 Other encephalopathy: Secondary | ICD-10-CM | POA: Diagnosis present

## 2017-04-27 DIAGNOSIS — I6300 Cerebral infarction due to thrombosis of unspecified precerebral artery: Secondary | ICD-10-CM | POA: Diagnosis not present

## 2017-04-27 DIAGNOSIS — I482 Chronic atrial fibrillation: Secondary | ICD-10-CM | POA: Diagnosis present

## 2017-04-27 DIAGNOSIS — Z515 Encounter for palliative care: Secondary | ICD-10-CM

## 2017-04-27 DIAGNOSIS — R29810 Facial weakness: Secondary | ICD-10-CM | POA: Diagnosis present

## 2017-04-27 DIAGNOSIS — E785 Hyperlipidemia, unspecified: Secondary | ICD-10-CM | POA: Diagnosis present

## 2017-04-27 LAB — CBG MONITORING, ED: Glucose-Capillary: 84 mg/dL (ref 65–99)

## 2017-04-27 LAB — COMPREHENSIVE METABOLIC PANEL
ALK PHOS: 53 U/L (ref 38–126)
ALT: 16 U/L (ref 14–54)
ANION GAP: 8 (ref 5–15)
AST: 35 U/L (ref 15–41)
Albumin: 3.1 g/dL — ABNORMAL LOW (ref 3.5–5.0)
BUN: 28 mg/dL — AB (ref 6–20)
CALCIUM: 9.4 mg/dL (ref 8.9–10.3)
CHLORIDE: 107 mmol/L (ref 101–111)
CO2: 25 mmol/L (ref 22–32)
CREATININE: 1.38 mg/dL — AB (ref 0.44–1.00)
GFR calc non Af Amer: 35 mL/min — ABNORMAL LOW (ref >60)
GFR, EST AFRICAN AMERICAN: 40 mL/min — AB (ref >60)
Glucose, Bld: 93 mg/dL (ref 65–99)
POTASSIUM: 4.5 mmol/L (ref 3.5–5.1)
SODIUM: 140 mmol/L (ref 135–145)
Total Bilirubin: 0.3 mg/dL (ref 0.3–1.2)
Total Protein: 7.5 g/dL (ref 6.5–8.1)

## 2017-04-27 LAB — CBC
HCT: 39.1 % (ref 36.0–46.0)
Hemoglobin: 12.6 g/dL (ref 12.0–15.0)
MCH: 29.1 pg (ref 26.0–34.0)
MCHC: 32.2 g/dL (ref 30.0–36.0)
MCV: 90.3 fL (ref 78.0–100.0)
PLATELETS: 208 10*3/uL (ref 150–400)
RBC: 4.33 MIL/uL (ref 3.87–5.11)
RDW: 13.5 % (ref 11.5–15.5)
WBC: 2.9 10*3/uL — ABNORMAL LOW (ref 4.0–10.5)

## 2017-04-27 LAB — DIFFERENTIAL
BASOS PCT: 1 %
Basophils Absolute: 0 10*3/uL (ref 0.0–0.1)
EOS ABS: 0.1 10*3/uL (ref 0.0–0.7)
EOS PCT: 2 %
Lymphocytes Relative: 35 %
Lymphs Abs: 1 10*3/uL (ref 0.7–4.0)
MONO ABS: 0.5 10*3/uL (ref 0.1–1.0)
Monocytes Relative: 16 %
Neutro Abs: 1.3 10*3/uL — ABNORMAL LOW (ref 1.7–7.7)
Neutrophils Relative %: 46 %

## 2017-04-27 LAB — I-STAT CHEM 8, ED
BUN: 38 mg/dL — AB (ref 6–20)
CHLORIDE: 107 mmol/L (ref 101–111)
CREATININE: 1.3 mg/dL — AB (ref 0.44–1.00)
Calcium, Ion: 1.08 mmol/L — ABNORMAL LOW (ref 1.15–1.40)
Glucose, Bld: 89 mg/dL (ref 65–99)
HEMATOCRIT: 41 % (ref 36.0–46.0)
Hemoglobin: 13.9 g/dL (ref 12.0–15.0)
Potassium: 5 mmol/L (ref 3.5–5.1)
Sodium: 140 mmol/L (ref 135–145)
TCO2: 28 mmol/L (ref 0–100)

## 2017-04-27 LAB — I-STAT TROPONIN, ED: Troponin i, poc: 0.28 ng/mL (ref 0.00–0.08)

## 2017-04-27 MED ORDER — CEFUROXIME AXETIL 500 MG PO TABS: 250.0000 mg | ORAL_TABLET | Freq: Two times a day (BID) | ORAL | Status: AC

## 2017-04-27 MED ORDER — ACETAMINOPHEN 325 MG PO TABS: 650.0000 mg | ORAL_TABLET | ORAL | Status: DC | PRN

## 2017-04-27 MED ORDER — LORAZEPAM 2 MG/ML IJ SOLN
1.0000 mg | Freq: Once | INTRAMUSCULAR | Status: AC
  Administered 2017-04-27: 1 mg via INTRAVENOUS
  Filled 2017-04-27: qty 1

## 2017-04-27 MED ORDER — DONEPEZIL HCL 10 MG PO TABS: 10.0000 mg | ORAL_TABLET | Freq: Every day | ORAL | Status: DC

## 2017-04-27 MED ORDER — ATORVASTATIN CALCIUM 80 MG PO TABS
80.0000 mg | ORAL_TABLET | Freq: Every day | ORAL | Status: DC
  Administered 2017-04-30: 80 mg via ORAL
  Filled 2017-04-27: qty 1

## 2017-04-27 MED ORDER — METOPROLOL TARTRATE 12.5 MG HALF TABLET: 12.5000 mg | ORAL_TABLET | Freq: Two times a day (BID) | ORAL | Status: DC

## 2017-04-27 MED ORDER — STROKE: EARLY STAGES OF RECOVERY BOOK: Freq: Once | Status: DC

## 2017-04-27 MED ORDER — VITAMIN B-12 1000 MCG PO TABS
1000.0000 ug | ORAL_TABLET | Freq: Every day | ORAL | Status: DC
  Administered 2017-04-30 – 2017-05-01 (×2): 1000 ug via ORAL
  Filled 2017-04-27 (×3): qty 1

## 2017-04-27 MED ORDER — ACETAMINOPHEN 160 MG/5ML PO SOLN: 650.0000 mg | ORAL | Status: DC | PRN

## 2017-04-27 MED ORDER — ACETAMINOPHEN 650 MG RE SUPP: 650.0000 mg | RECTAL | Status: DC | PRN

## 2017-04-27 MED ORDER — ASPIRIN 300 MG RE SUPP: 300.0000 mg | Freq: Every day | RECTAL | Status: DC

## 2017-04-27 NOTE — H&P (Signed)
History and Physical    Bonnie QuamMary L Pundt ZOX:[id number] DOB: [birth year] DOA: 04/27/2017  PCP: System, Provider Not In  Patient coming from:SNF [facility].   I have personally briefly reviewed patient's [facility]  Chief Complaint: ALTERED MENTAL STATUS.   HPI: [name] is a 83 y.o. female with medical history significant of hypertension. Chronic diastolic heart failure, atrial fibrillation, recent admission for UTI and sepsis, was discharged on 8/1 , sent back from SNF for altered mental status, . History is minimal as she is somnolent, would respond to verbal cues but wouldn ot give me any history sec to dementia.  She was seen normal around 12 and was leaning to the right. She was sent to ED for evaluation of stroke.  Most of the history is from EDP and snf notes.    Review of Systems: couldn't be obtained sec to dementia and altered mental status.   Past Medical History:  Diagnosis Date  . CAD (coronary artery disease)    a. Possible dx -  Lexiscan myoview (9/11): EF 76%, normal wall motion, possible small anterior MI with mild peri-infarct ischemia but cannot rule out breast attenuation, managed conservatively.  . Cerebrovascular accident, embolic (HCC)    left frontal  . Chronic atrial fibrillation (HCC)    a. Chronic. Has had cardioembolic right renal infarct in 12/06 and cardioembolic event to the right arm requiring right brachial embolectomy. Both events occurred while subtherapeutic on coumadin. b. Slow VR 12/2013.  . Diastolic HF (heart failure) (HCC)    Echo (7/11): EF 60-65%, moderate LVH, severe diastolic dysfunction.   . Dyslipidemia   . HTN (hypertension)    a. Resistant HTN. Patient states that this has been poorly controlled for years.   . Iron deficiency anemia   . Memory loss   . Obese   . PAD (peripheral artery disease) (HCC)    a. peripheral arterial dopplers (5/12) with right mid popliteal artery occlusion with reconstitution  of arteries below the knees by collaterals. Only mild reduction in ABI on the right (not tissue-threatening).    Past Surgical History:  Procedure Laterality Date  . EMBOLECTOMY     right brachial embolectomy     reports that she has never smoked. She has never used smokeless tobacco. She reports that she does not drink alcohol or use drugs.  No Known Allergies  Family History  Problem Relation Age of Onset  . Anemia Father   . Asthma Father   . Hypertension Mother    Couldn't be obtained, no family at bedside and dementia.   Prior to Admission medications   Medication Sig Start Date End Date Taking? Authorizing Provider  amLODipine (NORVASC) 10 MG tablet Take 1 tablet (10 mg total) by mouth daily. 07/29/16  Yes Langeland, Dawn T, MD  atorvastatin (LIPITOR) 80 MG tablet TAKE 1 TABLET BY MOUTH DAILY AT 6 PM. Patient taking differently: Take 80 mg by mouth at bedtime.  07/29/16  Yes Langeland, Dawn T, MD  cefUROXime (CEFTIN) 250 MG tablet Take 1 tablet (250 mg total) by mouth 2 (two) times daily with a meal. Patient taking differently: Take 250 mg by mouth 2 (two) times daily with a meal. Has 3 doses left 04/23/17  Yes Regalado, Belkys A, MD  cyanocobalamin 500 MCG tablet Take 1,000 mcg by mouth daily.   Yes [provider]  donepezil (ARICEPT) 10 MG tablet Take 1 tablet (10 mg total) by mouth at bedtime. 07/29/16  Yes Langeland, Dawn T, MD  LORazepam (ATIVAN) 2 MG/ML concentrated solution Take 0.5 mg by mouth every 8 (eight) hours.   Yes [provider]  metoprolol tartrate (LOPRESSOR) 25 MG tablet Take 0.5 tablets (12.5 mg total) by mouth 2 (two) times daily. 04/23/17  Yes Regalado, Belkys A, MD  rivaroxaban (XARELTO) 20 MG TABS tablet Take 1 tablet (20 mg total) by mouth daily with supper. 07/29/16  Yes Langeland, Dawn T, MD  sodium chloride (OCEAN) 0.65 % SOLN nasal spray Place 1 spray into both nostrils 3 (three) times daily as needed for congestion. 04/05/15   Marisa Severin, MD    Physical Exam: Vitals:   04/27/17 1615 04/27/17 1616 04/27/17 1630 04/27/17 1700  BP: 113/79   122/83  Pulse:   80 100  Resp:   17 16  Temp:  97.6 F (36.4 C)  97.7 F (36.5 C)  TempSrc:  Axillary  Oral  SpO2:   100%   Weight:    68.8 kg (151 lb 11.2 oz)  Height:        Constitutional: NAD, calm, comfortable Vitals:   04/27/17 1615 04/27/17 1616 04/27/17 1630 04/27/17 1700  BP: 113/79   122/83  Pulse:   80 100  Resp:   17 16  Temp:  97.6 F (36.4 C)  97.7 F (36.5 C)  TempSrc:  Axillary  Oral  SpO2:   100%   Weight:    68.8 kg (151 lb 11.2 oz)  Height:       Eyes: PERRL, lids and conjunctivae normal ENMT: dry mucous membranes.  Respiratory: clear to auscultation bilaterally, no wheezing, no crackles. Normal respiratory effort. No accessory muscle use.  Cardiovascular: irregular, no murmer. Trace pedal edema.  Abdomen: no tenderness, no masses palpated. No hepatosplenomegaly. Bowel sounds positive.  Musculoskeletal:trace pedal edema, no cyanosis or clubbing.  Skin: no rashes, lesions, ulcers. No induration Neurologic: detailed neuro exam couldn't be done.  Not following commands. Responded to verbal cues.  Psychiatric: mood and insight couldn't be assessed.   Labs on Admission: I have personally reviewed following labs and imaging studies  CBC:  Recent Labs Lab 04/21/17 0449 04/23/17 0743 04/27/17 1404 04/27/17 1420  WBC 3.8* 5.4  --  2.9*  NEUTROABS  --   --   --  1.3*  HGB 14.7 12.5 13.9 12.6  HCT 43.5 37.4 41.0 39.1  MCV 89.3 90.1  --  90.3  PLT 142* 150  --  208   Basic Metabolic Panel:  Recent Labs Lab 04/21/17 0449 04/23/17 0743 04/27/17 1404 04/27/17 1420  NA 142 143 140 140  K 4.1 3.6 5.0 4.5  CL 108 107 107 107  CO2 21* 27  --  25  GLUCOSE 107* 86 89 93  BUN 21* 29* 38* 28*  CREATININE 1.03* 1.20* 1.30* 1.38*  CALCIUM 9.5 9.2  --  9.4   GFR: Estimated Creatinine Clearance: 29.9 mL/min (A) (by C-G formula based on SCr of  1.38 mg/dL (H)). Liver Function Tests:  Recent Labs Lab 04/27/17 1420  AST 35  ALT 16  ALKPHOS 53  BILITOT 0.3  PROT 7.5  ALBUMIN 3.1*   No results for input(s): LIPASE, AMYLASE in the last 168 hours. No results for input(s): AMMONIA in the last 168 hours. Coagulation Profile: No results for input(s): INR, PROTIME in the last 168 hours. Cardiac Enzymes: No results for input(s): CKTOTAL, CKMB, CKMBINDEX, TROPONINI in the last 168 hours. BNP (last 3 results) No results for input(s): PROBNP  in the last 8760 hours. HbA1C: No results for input(s): HGBA1C in the last 72 hours. CBG:  Recent Labs Lab 04/27/17 1348  GLUCAP 84   Lipid Profile: No results for input(s): CHOL, HDL, LDLCALC, TRIG, CHOLHDL, LDLDIRECT in the last 72 hours. Thyroid Function Tests: No results for input(s): TSH, T4TOTAL, FREET4, T3FREE, THYROIDAB in the last 72 hours. Anemia Panel: No results for input(s): VITAMINB12, FOLATE, FERRITIN, TIBC, IRON, RETICCTPCT in the last 72 hours. Urine analysis:    Component Value Date/Time   COLORURINE YELLOW 04/19/2017 2047   APPEARANCEUR CLEAR 04/19/2017 2047   LABSPEC 1.011 04/19/2017 2047   PHURINE 5.0 04/19/2017 2047   GLUCOSEU NEGATIVE 04/19/2017 2047   HGBUR MODERATE (A) 04/19/2017 2047   BILIRUBINUR NEGATIVE 04/19/2017 2047   BILIRUBINUR small 07/29/2016 1103   KETONESUR 5 (A) 04/19/2017 2047   PROTEINUR 30 (A) 04/19/2017 2047   UROBILINOGEN 1.0 07/29/2016 1103   UROBILINOGEN 1.0 04/14/2015 1030   NITRITE POSITIVE (A) 04/19/2017 2047   LEUKOCYTESUR TRACE (A) 04/19/2017 2047    Radiological Exams on Admission: Dg Chest Port 1 View  Result Date: 04/27/2017 CLINICAL DATA:  Altered mental status, Hx of CAD, heart failure EXAM: PORTABLE CHEST 1 VIEW COMPARISON:  Chest x-ray dated 04/14/2015. Chest x-ray dated 04/19/2017. FINDINGS: Cardiomegaly appears stable. Atherosclerotic changes noted at the aortic arch, presumed aortic ectasia. Lungs are clear. No  pleural effusion or pneumothorax seen. Degenerative changes noted at the shoulders. No acute or suspicious osseous finding. Stable elevation of the left hemidiaphragm. IMPRESSION: No active disease.  No evidence of pneumonia or pulmonary edema. Stable cardiomegaly. Aortic atherosclerosis. Electronically Signed   By: Bary Richard M.D.   On: 04/27/2017 14:37   Ct Head Code Stroke Wo Contrast  Result Date: 04/27/2017 CLINICAL DATA:  Code stroke.  Right-sided paralysis. EXAM: CT HEAD WITHOUT CONTRAST TECHNIQUE: Contiguous axial images were obtained from the base of the skull through the vertex without intravenous contrast. COMPARISON:  04/19/2017 FINDINGS: Brain: Very motion degraded study, patient reportedly combative. No evidence of acute hemorrhage. There is a remote left parietal infarct that is moderate size. No interval infarct is visible. No hydrocephalus or masslike findings. Vascular: Atherosclerosis.  No evidence of hyperdense vessel. Skull: Hyperostosis.  No acute finding. Sinuses/Orbits: No acute finding Other: Prelim results sent by text page 04/27/2017 at 1:53 pm to Dr. Laurence Slate. ASPECTS [facility] Stroke Program Early CT Score) Not scored given the degree of artifact. IMPRESSION: 1. Combative patient with motion degraded study that could obscure pathology. 2. No acute finding. 3. Moderate remote left parietal infarct. Electronically Signed   By: Marnee Spring M.D.   On: 04/27/2017 13:55    EKG: Independently reviewed. afib with RVR.   Assessment/Plan Active Problems:   Stroke [facility])   ACUTE ENCEPHALOPATHY:  Unclear etiology. Evaluating for stroke.  Admit to telemetry.  Stroke work up ordered.  Initial CT head negative for acute pathology.  Follow up with MRI brain and MRA HEAD and neck.  No need for echo as she had echo one week ago.  Get carotid duplex.  Lipid panel and hgba1c ordered.  Neurology consulted and recommendations pending.   Dementia:  Resume aricept if she passes swallow  screen.    Hypertension: permissive hypertension.    afib with RVR:  On xarelto for anticoagulation ,w hich is being held as per neurology.  And on metoprolol for rate control.   Elevated troponin:  ? Demand ischemia from afib vs UTI.    Acute kidney injury:  Holding ace inhibitor for now.  Gentle hydration.  Repeat renal parameters.          DVT prophylaxis: xarelto Code Status: ful lcode.  Family Communication: none at bedside, will call daughter and update.  Disposition Plan: pending further eval.  Consults called: neurology.  Admission status: obs tele.    Kathlen ModyAKULA,Clenton Esper MD [facility] Pager [phone]  If 7PM-7AM, please contact night-coverage [email or web address] Password [email or web address]  04/27/2017, 5:25 PM

## 2017-04-27 NOTE — ED Provider Notes (Addendum)
[facility]-EMERGENCY DEPT Provider Note   CSN: [id number] Arrival date & time: 04/27/17  1325   An emergency department physician performed an initial assessment on this suspected stroke patient at [id number].  History   Chief Complaint Chief Complaint  Patient presents with  . Code Stroke    HPI [name] is a 83 y.o. female.  Patient is a 83 year old female with a history of dementia, coronary artery disease, prior stroke and chronic A. fib who presents with altered mental status. History is limited due to patient's dementia. Per EMS, the nursing facility states that patient was found last known normal at 1255 today leading to the right. She's also less responsive than she normally is per report. No other history could be obtained.      Past Medical History:  Diagnosis Date  . CAD (coronary artery disease)    a. Possible dx -  Lexiscan myoview (9/11): EF 76%, normal wall motion, possible small anterior MI with mild peri-infarct ischemia but cannot rule out breast attenuation, managed conservatively.  . Cerebrovascular accident, embolic (HCC)    left frontal  . Chronic atrial fibrillation (HCC)    a. Chronic. Has had cardioembolic right renal infarct in 12/06 and cardioembolic event to the right arm requiring right brachial embolectomy. Both events occurred while subtherapeutic on coumadin. b. Slow VR 12/2013.  . Diastolic HF (heart failure) (HCC)    Echo (7/11): EF 60-65%, moderate LVH, severe diastolic dysfunction.   . Dyslipidemia   . HTN (hypertension)    a. Resistant HTN. Patient states that this has been poorly controlled for years.   . Iron deficiency anemia   . Memory loss   . Obese   . PAD (peripheral artery disease) (HCC)    a. peripheral arterial dopplers (5/12) with right mid popliteal artery occlusion with reconstitution of arteries below the knees by collaterals. Only mild reduction in ABI on the right (not tissue-threatening).    Patient Active Problem List   Diagnosis Date Noted  . Stroke (HCC) 04/27/2017  . Dehydration 04/19/2017  . Acute lower UTI 04/19/2017  . Atrial fibrillation with RVR (HCC) 04/19/2017  . AKI (acute kidney injury) (HCC) 04/19/2017  . Sepsis (HCC) 04/19/2017  . Pure hypercholesterolemia 08/27/2016  . Encounter for therapeutic drug monitoring 01/20/2014  . Dementia with behavioral disturbance 01/01/2014  . Leukopenia 12/30/2013  . Abnormal TSH 12/30/2013  . Hypokalemia 12/30/2013  . Acute encephalopathy 12/29/2013  . Atrial fibrillation with slow ventricular response (HCC) 12/29/2013  . Thrombocytopenia, unspecified (HCC) 12/29/2013  . Bradycardia 12/29/2013  . Long term (current) use of anticoagulants 01/24/2011  . Possible CAD - abnl myoview 2011, managed conservatively 01/17/2011  . PAD (peripheral artery disease) (HCC) 01/17/2011  . Chronic diastolic heart failure (HCC) 07/23/2010  . ANEMIA 06/19/2009  . HTN (hypertension) 06/02/2007    Past Surgical History:  Procedure Laterality Date  . EMBOLECTOMY     right brachial embolectomy    OB History    No data available       Home Medications    Prior to Admission medications   Medication Sig Start Date End Date Taking? Authorizing Provider  amLODipine (NORVASC) 10 MG tablet Take 1 tablet (10 mg total) by mouth daily. 07/29/16  Yes Langeland, Dawn T, MD  atorvastatin (LIPITOR) 80 MG tablet TAKE 1 TABLET BY MOUTH DAILY AT 6 PM. Patient taking differently: Take 80 mg by mouth at bedtime.  07/29/16  Yes Langeland, Dawn T, MD  cefUROXime (CEFTIN) 250 MG tablet Take  1 tablet (250 mg total) by mouth 2 (two) times daily with a meal. Patient taking differently: Take 250 mg by mouth 2 (two) times daily with a meal. Has 3 doses left 04/23/17  Yes Regalado, Belkys A, MD  cyanocobalamin 500 MCG tablet Take 1,000 mcg by mouth daily.   Yes [provider]  donepezil (ARICEPT) 10 MG tablet Take 1 tablet (10 mg total) by mouth at bedtime. 07/29/16  Yes Langeland,  Dawn T, MD  LORazepam (ATIVAN) 2 MG/ML concentrated solution Take 0.5 mg by mouth every 8 (eight) hours.   Yes [provider]  metoprolol tartrate (LOPRESSOR) 25 MG tablet Take 0.5 tablets (12.5 mg total) by mouth 2 (two) times daily. 04/23/17  Yes Regalado, Belkys A, MD  rivaroxaban (XARELTO) 20 MG TABS tablet Take 1 tablet (20 mg total) by mouth daily with supper. 07/29/16  Yes Langeland, Dawn T, MD  sodium chloride (OCEAN) 0.65 % SOLN nasal spray Place 1 spray into both nostrils 3 (three) times daily as needed for congestion. 04/05/15   Marisa Severin, MD    Family History Family History  Problem Relation Age of Onset  . Anemia Father   . Asthma Father   . Hypertension Mother     Social History Social History  Substance Use Topics  . Smoking status: Never Smoker  . Smokeless tobacco: Never Used  . Alcohol use No     Allergies   Patient has no known allergies.   Review of Systems Review of Systems  Unable to perform ROS: Dementia     Physical Exam Updated Vital Signs BP 113/79   Pulse 75   Temp 97.6 F (36.4 C) (Axillary)   Resp 14   Ht 5\' 4"  (1.626 m)   SpO2 97%   Physical Exam  Constitutional: She appears well-developed and well-nourished.  HENT:  Head: Normocephalic and atraumatic.  Eyes: Pupils are equal, round, and reactive to light.  Neck: Normal range of motion. Neck supple.  Cardiovascular: Normal rate, regular rhythm and normal heart sounds.   Pulmonary/Chest: Effort normal and breath sounds normal. No respiratory distress. She has no wheezes. She has no rales. She exhibits no tenderness.  Abdominal: Soft. Bowel sounds are normal. There is no tenderness. There is no rebound and no guarding.  Musculoskeletal: Normal range of motion. She exhibits no edema.  Lymphadenopathy:    She has no cervical adenopathy.  Neurological:  Patient is alert with eyes open but is nonverbal. She has no obvious facial drooping. She does seem to have some weakness on  grip of the right hand as compared to the left. No appreciable differences in the legs are obtained the patient is not following commands.  Skin: Skin is warm and dry. No rash noted.  Psychiatric: She has a normal mood and affect.     ED Treatments / Results  Labs (all labs ordered are listed, but only abnormal results are displayed) Labs Reviewed  CBC - Abnormal; Notable for the following:       Result Value   WBC 2.9 (*)    All other components within normal limits  DIFFERENTIAL - Abnormal; Notable for the following:    Neutro Abs 1.3 (*)    All other components within normal limits  COMPREHENSIVE METABOLIC PANEL - Abnormal; Notable for the following:    BUN 28 (*)    Creatinine, Ser 1.38 (*)    Albumin 3.1 (*)    GFR calc non Af Amer 35 (*)  GFR calc Af Amer 40 (*)    All other components within normal limits  I-STAT TROPONIN, ED - Abnormal; Notable for the following:    Troponin i, poc 0.28 (*)    All other components within normal limits  I-STAT CHEM 8, ED - Abnormal; Notable for the following:    BUN 38 (*)    Creatinine, Ser 1.30 (*)    Calcium, Ion 1.08 (*)    All other components within normal limits  URINALYSIS, ROUTINE W REFLEX MICROSCOPIC  CBG MONITORING, ED    EKG  EKG Interpretation  Date/Time:  Sunday April 27 2017 13:56:10 EDT Ventricular Rate:  98 PR Interval:    QRS Duration: 94 QT Interval:  336 QTC Calculation: 429 R Axis:   13 Text Interpretation:  Atrial fibrillation LVH with secondary repolarization abnormality since last tracing no significant change Confirmed by Rolan Bucco [phone]) on 04/27/2017 2:13:11 PM       Radiology Dg Chest Port 1 View  Result Date: 04/27/2017 CLINICAL DATA:  Altered mental status, Hx of CAD, heart failure EXAM: PORTABLE CHEST 1 VIEW COMPARISON:  Chest x-ray dated 04/14/2015. Chest x-ray dated 04/19/2017. FINDINGS: Cardiomegaly appears stable. Atherosclerotic changes noted at the aortic arch, presumed aortic  ectasia. Lungs are clear. No pleural effusion or pneumothorax seen. Degenerative changes noted at the shoulders. No acute or suspicious osseous finding. Stable elevation of the left hemidiaphragm. IMPRESSION: No active disease.  No evidence of pneumonia or pulmonary edema. Stable cardiomegaly. Aortic atherosclerosis. Electronically Signed   By: Bary Richard M.D.   On: 04/27/2017 14:37   Ct Head Code Stroke Wo Contrast  Result Date: 04/27/2017 CLINICAL DATA:  Code stroke.  Right-sided paralysis. EXAM: CT HEAD WITHOUT CONTRAST TECHNIQUE: Contiguous axial images were obtained from the base of the skull through the vertex without intravenous contrast. COMPARISON:  04/19/2017 FINDINGS: Brain: Very motion degraded study, patient reportedly combative. No evidence of acute hemorrhage. There is a remote left parietal infarct that is moderate size. No interval infarct is visible. No hydrocephalus or masslike findings. Vascular: Atherosclerosis.  No evidence of hyperdense vessel. Skull: Hyperostosis.  No acute finding. Sinuses/Orbits: No acute finding Other: Prelim results sent by text page 04/27/2017 at 1:53 pm to Dr. Laurence Slate. ASPECTS [facility] Stroke Program Early CT Score) Not scored given the degree of artifact. IMPRESSION: 1. Combative patient with motion degraded study that could obscure pathology. 2. No acute finding. 3. Moderate remote left parietal infarct. Electronically Signed   By: Marnee Spring M.D.   On: 04/27/2017 13:55    Procedures Procedures (including critical care time)  Medications Ordered in ED Medications - No data to display   Initial Impression / Assessment and Plan / ED Course  I have reviewed the triage vital signs and the nursing notes.  Pertinent labs & imaging results that were available during my care of the patient were reviewed by me and considered in my medical decision making (see chart for details).     Patient is a 83 year old female with a history of dementia and prior  stroke, A. fib on Xarelto who presents with right-sided weakness and decreased mental status. Her vital signs are stable. She has been seen by neurology and is not a candidate for intervention. Her head CT shows no acute abnormalities. I will consult the hospitalist service for admission.  I spoke with Dr. Blake Divine who will admit the pt.  16:21: I did speak with the neurologist who advises to HOLD THE XARELTO FOR NOW.  Message  sent to Dr. Blake DivineAkula as well.  Final Clinical Impressions(s) / ED Diagnoses   Final diagnoses:  Somnolence  Right sided weakness    New Prescriptions New Prescriptions   No medications on file     Rolan BuccoBelfi, Catherin Doorn, MD 04/27/17 1609    Rolan BuccoBelfi, Bryonna Sundby, MD 04/27/17 1622

## 2017-04-27 NOTE — ED Notes (Signed)
Pt given a warm blanket and dimmed lights to attempt to calm. Pt continues to fidget with cords and role in bed.

## 2017-04-27 NOTE — ED Notes (Signed)
Pt attempting to crawl out of bed, seizure pads place for protection.

## 2017-04-27 NOTE — ED Notes (Signed)
Labs results reported to Dr.Belfi. For I-Stat Troponin of 0.28.

## 2017-04-27 NOTE — Code Documentation (Signed)
83 y.o. Female with PMHx of PAD, CAD, CVA, dementia, HTN, HLD and a-fibb on Xarelto who was stated to be in her normal state of health today at her facility where she resides, [facility]. Per staff, she had a sudden onset of global aphasia and leaning towards the right. On arrival, patient was met at the bridge by the stroke team, airway cleared by EDP, labs attempted but not successful d/t difficult insertion and then taken to CT. NIHSS 21. See EMR for NIHSS and code stroke times. On assessment, patient globally aphasic, unable to follow commands and appears to have generalized weakness with the RUE appearing slightly weaker. CT motion degraded d/t pt movement, per radiology report, CT negative for acute findings. ASPECTS not scored d/t artifact. IV tPA not given d/t being on Xarelto. Not a thrombectomy candidate d/t PmRS 4-5 per neurologist. ED bedside handoff with ED RN Roselyn Reef.

## 2017-04-27 NOTE — ED Notes (Signed)
[place] alarm paid placed under patient for safety.

## 2017-04-27 NOTE — Consult Note (Signed)
Requesting Physician: Dr. Fredderick Phenix    Chief Complaint: facial droop, Right side weakness  History obtained from:  EMS, NH nurse  HPI:                                                                                                                                       [name] is an 83 y.o. female history of advanced dementia, coronary artery disease, atrial fibrillation, prior stroke was brought in as a stroke alert after her nurse found her to have a facial droop and leaning to right side at 12:55 PM. She takes Xarelto and last dose was 4:35 PM. At baseline the nursing home, she cannot have a conversation but intermittently some words. She she can only follow simple commands, needs assistance to eat and he use bathroom, and can walk with someone helping her.    She arrived at 1:25 PM to [facility] ER. She was aphasic, right arm weakness, right facial droop. He had was very limited, however did not show bleed. She could not receive TPA as she was on anticoagulation and was not a candidate for mechanical thrombectomy due to poor baseline.   Date last known well: 04/27/17 Time last known well: 12.55 pm tPA Given: no NIHSS: 21 Modified Rankin: 4   Past Medical History:  Diagnosis Date  . CAD (coronary artery disease)    a. Possible dx -  Lexiscan myoview (9/11): EF 76%, normal wall motion, possible small anterior MI with mild peri-infarct ischemia but cannot rule out breast attenuation, managed conservatively.  . Cerebrovascular accident, embolic (HCC)    left frontal  . Chronic atrial fibrillation (HCC)    a. Chronic. Has had cardioembolic right renal infarct in 12/06 and cardioembolic event to the right arm requiring right brachial embolectomy. Both events occurred while subtherapeutic on coumadin. b. Slow VR 12/2013.  . Diastolic HF (heart failure) (HCC)    Echo (7/11): EF 60-65%, moderate LVH, severe diastolic dysfunction.   . Dyslipidemia   . HTN (hypertension)    a. Resistant HTN.  Patient states that this has been poorly controlled for years.   . Iron deficiency anemia   . Memory loss   . Obese   . PAD (peripheral artery disease) (HCC)    a. peripheral arterial dopplers (5/12) with right mid popliteal artery occlusion with reconstitution of arteries below the knees by collaterals. Only mild reduction in ABI on the right (not tissue-threatening).    Past Surgical History:  Procedure Laterality Date  . EMBOLECTOMY     right brachial embolectomy    Family History  Problem Relation Age of Onset  . Anemia Father   . Asthma Father   . Hypertension Mother    Social History:  reports that she has never smoked. She has never used smokeless tobacco. She reports that she does not drink alcohol or use drugs.  Allergies: No Known Allergies  Medications:                                                                                                                           {medication reviewed/display  ROS:                                                                                                                                       Unable to obtain    Examination:                                                                                                      General: Appears well-developed and well-nourished.  Psych: Affect appropriate to situation Eyes: No scleral injection HENT: No OP obstrucion Head: Normocephalic.  Cardiovascular: Normal rate and regular rhythm.  Respiratory: Effort normal and breath sounds normal to anterior ascultation GI: Soft.  No distension. There is no tenderness.  Skin: WDI   Neurological Examination Mental Status: Awake, aphasic, did not follow commands Cranial Nerves: PERRLA. Left gaze preference, does not blink to threat on right side Right : Upper extremity   2/5    Left:     Upper extremity   4/5  Lower extremity   4/5     Lower extremity   4/5 Tone and bulk:normal tone throughout; no atrophy noted Sensory:  withdraws to pain  bilaterally Deep Tendon Reflexes: 2+ and symmetric throughout Plantars: Right: downgoing   Left: downgoing Cerebellar: unable to assess accurately , no profound ataxia noted Gait: unable to test     Lab Results: Basic Metabolic Panel:  Recent Labs Lab 04/21/17 0449 04/23/17 0743 04/27/17 1404 04/27/17 1420  NA 142 143 140 140  K 4.1 3.6 5.0 4.5  CL 108 107 107 107  CO2 21* 27  --  25  GLUCOSE 107* 86 89 93  BUN 21* 29* 38* 28*  CREATININE 1.03* 1.20* 1.30* 1.38*  CALCIUM 9.5 9.2  --  9.4  CBC:  Recent Labs Lab 04/21/17 0449 04/23/17 0743 04/27/17 1404 04/27/17 1420  WBC 3.8* 5.4  --  2.9*  NEUTROABS  --   --   --  1.3*  HGB 14.7 12.5 13.9 12.6  HCT 43.5 37.4 41.0 39.1  MCV 89.3 90.1  --  90.3  PLT 142* 150  --  208    Coagulation Studies: No results for input(s): LABPROT, INR in the last 72 hours.  Imaging: Dg Chest Port 1 View  Result Date: 04/27/2017 CLINICAL DATA:  Altered mental status, Hx of CAD, heart failure EXAM: PORTABLE CHEST 1 VIEW COMPARISON:  Chest x-ray dated 04/14/2015. Chest x-ray dated 04/19/2017. FINDINGS: Cardiomegaly appears stable. Atherosclerotic changes noted at the aortic arch, presumed aortic ectasia. Lungs are clear. No pleural effusion or pneumothorax seen. Degenerative changes noted at the shoulders. No acute or suspicious osseous finding. Stable elevation of the left hemidiaphragm. IMPRESSION: No active disease.  No evidence of pneumonia or pulmonary edema. Stable cardiomegaly. Aortic atherosclerosis. Electronically Signed   By: Bary RichardStan  Maynard M.D.   On: 04/27/2017 14:37   Ct Head Code Stroke Wo Contrast  Result Date: 04/27/2017 CLINICAL DATA:  Code stroke.  Right-sided paralysis. EXAM: CT HEAD WITHOUT CONTRAST TECHNIQUE: Contiguous axial images were obtained from the base of the skull through the vertex without intravenous contrast. COMPARISON:  04/19/2017 FINDINGS: Brain: Very motion degraded study, patient  reportedly combative. No evidence of acute hemorrhage. There is a remote left parietal infarct that is moderate size. No interval infarct is visible. No hydrocephalus or masslike findings. Vascular: Atherosclerosis.  No evidence of hyperdense vessel. Skull: Hyperostosis.  No acute finding. Sinuses/Orbits: No acute finding Other: Prelim results sent by text page 04/27/2017 at 1:53 pm to Dr. Laurence SlateAroor. ASPECTS [facility]([place] Stroke Program Early CT Score) Not scored given the degree of artifact. IMPRESSION: 1. Combative patient with motion degraded study that could obscure pathology. 2. No acute finding. 3. Moderate remote left parietal infarct. Electronically Signed   By: Marnee SpringJonathon  Watts M.D.   On: 04/27/2017 13:55     ASSESSMENT AND PLAN   [name] is a 83 year old female with advanced dementia who presents with sudden onset right-sided weakness. Her exam is very concerning for a left MCA stroke. She is very unfortunate to have varices are also the night before and still present with a ischemic stroke. I fear that she has had a large stroke and possibly a large vessel occlusion- however shewould not be a candidate for any intervention given her poor baseline functional status. Her MRS is 4, and she is almost nonverbal at baseline.    Left MCA stroke   Admit to medicine Hold Xarelto tonight, she likely has a large stroke this would increase risk of hemorrhage. Permissive hypertension up to 220 systolic Continue atorvastatin 40 mg Further workup - echo if desired by stroke team  Nothing by mouth she will likely not pass dysphagia Discuss goals of care, consider palliative consult    Aava Deland MD [facility] [phone]  If 7pm to 7am, please call on call as listed on AMION.

## 2017-04-27 NOTE — Progress Notes (Signed)
New Admission Note:  Arrival Method: on stretcher from ER Mental Orientation: Pt is not speaking or following command  Telemetry:5707/A fib Assessment: Completed Skin: see flowsheet IV: L forearm  Pain:0/10 Safety Measures: Safety Fall Prevention Plan was given, discussed. Admission: Completed 5M07: Patient has been orientated to the room, unit and the staff. Family:  Orders have been reviewed and implemented. Will continue to monitor the patient. Call light has been placed within reach and bed alarm has been activated.   Lawernce IonYari Lee Kuang ,RN

## 2017-04-27 NOTE — ED Notes (Signed)
Pt moved from D30 to D33 in order to have better visualization of the patient from nurses station. Order placed for tele sitter.

## 2017-04-27 NOTE — ED Triage Notes (Signed)
PT BIB [facility] from SNF where they report LSN 1255 today with pt leaning more to the right. Unable to obtain IV.

## 2017-04-27 NOTE — ED Notes (Signed)
Pt sleeping, this writer not preforming stroke swallow screen until pt alert enough to respond appropriately.

## 2017-04-28 ENCOUNTER — Ambulatory Visit (HOSPITAL_COMMUNITY): Payer: Medicare Other

## 2017-04-28 ENCOUNTER — Observation Stay (HOSPITAL_COMMUNITY): Payer: Medicare Other

## 2017-04-28 ENCOUNTER — Observation Stay (HOSPITAL_COMMUNITY)
Admit: 2017-04-28 | Discharge: 2017-04-28 | Disposition: A | Discharge status: Home / Self Care | Payer: Medicare Other | Attending: Internal Medicine | Admitting: Internal Medicine

## 2017-04-28 DIAGNOSIS — R64 Cachexia: Secondary | ICD-10-CM | POA: Diagnosis present

## 2017-04-28 DIAGNOSIS — I110 Hypertensive heart disease with heart failure: Secondary | ICD-10-CM | POA: Diagnosis present

## 2017-04-28 DIAGNOSIS — Z825 Family history of asthma and other chronic lower respiratory diseases: Secondary | ICD-10-CM | POA: Diagnosis not present

## 2017-04-28 DIAGNOSIS — I272 Pulmonary hypertension, unspecified: Secondary | ICD-10-CM | POA: Diagnosis present

## 2017-04-28 DIAGNOSIS — G9340 Encephalopathy, unspecified: Secondary | ICD-10-CM | POA: Diagnosis not present

## 2017-04-28 DIAGNOSIS — I340 Nonrheumatic mitral (valve) insufficiency: Secondary | ICD-10-CM

## 2017-04-28 DIAGNOSIS — F0391 Unspecified dementia with behavioral disturbance: Secondary | ICD-10-CM | POA: Diagnosis present

## 2017-04-28 DIAGNOSIS — R531 Weakness: Secondary | ICD-10-CM | POA: Diagnosis not present

## 2017-04-28 DIAGNOSIS — N390 Urinary tract infection, site not specified: Secondary | ICD-10-CM | POA: Diagnosis present

## 2017-04-28 DIAGNOSIS — I2510 Atherosclerotic heart disease of native coronary artery without angina pectoris: Secondary | ICD-10-CM | POA: Diagnosis present

## 2017-04-28 DIAGNOSIS — I739 Peripheral vascular disease, unspecified: Secondary | ICD-10-CM | POA: Diagnosis present

## 2017-04-28 DIAGNOSIS — Z7189 Other specified counseling: Secondary | ICD-10-CM | POA: Diagnosis not present

## 2017-04-28 DIAGNOSIS — G8191 Hemiplegia, unspecified affecting right dominant side: Secondary | ICD-10-CM | POA: Diagnosis present

## 2017-04-28 DIAGNOSIS — Z8249 Family history of ischemic heart disease and other diseases of the circulatory system: Secondary | ICD-10-CM | POA: Diagnosis not present

## 2017-04-28 DIAGNOSIS — I4891 Unspecified atrial fibrillation: Secondary | ICD-10-CM

## 2017-04-28 DIAGNOSIS — R946 Abnormal results of thyroid function studies: Secondary | ICD-10-CM | POA: Diagnosis present

## 2017-04-28 DIAGNOSIS — N179 Acute kidney failure, unspecified: Secondary | ICD-10-CM | POA: Diagnosis not present

## 2017-04-28 DIAGNOSIS — I63512 Cerebral infarction due to unspecified occlusion or stenosis of left middle cerebral artery: Secondary | ICD-10-CM | POA: Diagnosis present

## 2017-04-28 DIAGNOSIS — E785 Hyperlipidemia, unspecified: Secondary | ICD-10-CM | POA: Diagnosis present

## 2017-04-28 DIAGNOSIS — R4701 Aphasia: Secondary | ICD-10-CM | POA: Diagnosis present

## 2017-04-28 DIAGNOSIS — I6300 Cerebral infarction due to thrombosis of unspecified precerebral artery: Secondary | ICD-10-CM

## 2017-04-28 DIAGNOSIS — R29810 Facial weakness: Secondary | ICD-10-CM | POA: Diagnosis present

## 2017-04-28 DIAGNOSIS — G9349 Other encephalopathy: Secondary | ICD-10-CM | POA: Diagnosis present

## 2017-04-28 DIAGNOSIS — Z79899 Other long term (current) drug therapy: Secondary | ICD-10-CM | POA: Diagnosis not present

## 2017-04-28 DIAGNOSIS — R748 Abnormal levels of other serum enzymes: Secondary | ICD-10-CM | POA: Diagnosis present

## 2017-04-28 DIAGNOSIS — I5032 Chronic diastolic (congestive) heart failure: Secondary | ICD-10-CM | POA: Diagnosis present

## 2017-04-28 DIAGNOSIS — Z6826 Body mass index (BMI) 26.0-26.9, adult: Secondary | ICD-10-CM | POA: Diagnosis not present

## 2017-04-28 DIAGNOSIS — Z7901 Long term (current) use of anticoagulants: Secondary | ICD-10-CM | POA: Diagnosis not present

## 2017-04-28 DIAGNOSIS — R400 Somnolence: Secondary | ICD-10-CM | POA: Diagnosis present

## 2017-04-28 DIAGNOSIS — R29721 NIHSS score 21: Secondary | ICD-10-CM | POA: Diagnosis present

## 2017-04-28 DIAGNOSIS — I482 Chronic atrial fibrillation: Secondary | ICD-10-CM | POA: Diagnosis present

## 2017-04-28 DIAGNOSIS — Z515 Encounter for palliative care: Secondary | ICD-10-CM | POA: Diagnosis not present

## 2017-04-28 LAB — LIPID PANEL
Cholesterol: 145 mg/dL (ref 0–200)
HDL: 33 mg/dL — AB (ref >40)
LDL CALC: 98 mg/dL (ref 0–99)
Total CHOL/HDL Ratio: 4.4 RATIO
Triglycerides: 70 mg/dL (ref <150)
VLDL: 14 mg/dL (ref 0–40)

## 2017-04-28 MED ORDER — METOPROLOL TARTRATE 5 MG/5ML IV SOLN
5.0000 mg | Freq: Four times a day (QID) | INTRAVENOUS | Status: DC
  Administered 2017-04-28 – 2017-04-29 (×6): 5 mg via INTRAVENOUS
  Filled 2017-04-28 (×6): qty 5

## 2017-04-28 MED ORDER — SODIUM CHLORIDE 0.9 % IV SOLN
INTRAVENOUS | Status: DC
  Administered 2017-04-28 – 2017-04-30 (×4): via INTRAVENOUS

## 2017-04-28 MED ORDER — LORAZEPAM 2 MG/ML IJ SOLN
0.5000 mg | Freq: Once | INTRAMUSCULAR | Status: AC
  Administered 2017-04-28: 0.5 mg via INTRAVENOUS
  Filled 2017-04-28: qty 1

## 2017-04-28 MED ORDER — WHITE PETROLATUM GEL
Status: AC
  Administered 2017-04-28: 16:00:00
  Filled 2017-04-28: qty 1

## 2017-04-28 NOTE — Progress Notes (Signed)
PROGRESS NOTE    [name]  UJW:[id number] DOB: 21-Aug-1933 DOA: 04/27/2017 PCP: System, Provider Not In   Outpatient Specialists:     Brief Narrative:  [name] is a 83 y.o. female with medical history significant of hypertension. Chronic diastolic heart failure, atrial fibrillation, recent admission for UTI and sepsis, was discharged on 8/1 , sent back from SNF for altered mental status, . History is minimal as she is somnolent, would respond to verbal cues but wouldn ot give me any history sec to dementia.  She was seen normal around 12 and was leaning to the right. She was sent to ED for evaluation of stroke.  Most of the history is from EDP and snf notes.    Assessment & Plan:   Active Problems:   Stroke [facility])   AMS- ? Due to CVA Initial CT head negative for acute pathology- unable to do MRI due to patient not being able to cooperate Recent echo, so will not replete.  Unable to get carotid duplex as patient would not cooperate  Lipid panel and hgba1c ordered.  Neurology consulted  -EEG pending -palliative care consult for GOC  Dementia- severe:  Resume aricept if she passes swallow screen.   Hypertension: permissive hypertension.   afib with RVR:  On xarelto for anticoagulation, which is being held as per neurology.  -IV BB while NPO  Elevated troponin:  -decreased from last admission  Acute kidney injury:  Hold ace inhibitor  Gentle hydration.      DVT prophylaxis:  SCD's  Code Status: Full Code   Family Communication: Called daughter- no answer  Disposition Plan:     Consultants:   Neuro  Palliative care    Subjective: Sleeping, moving but not speaking  Objective: Vitals:   04/28/17 0513 04/28/17 0652 04/28/17 1003 04/28/17 1206  BP: 117/73 134/84 (!) 118/95 (!) 166/111  Pulse: 94 80 (!) 120 (!) 145  Resp: 18 18 18    Temp: 97.9 F (36.6 C) 97.9 F (36.6 C) (!) 97.5 F (36.4 C)   TempSrc: Axillary Axillary Oral     SpO2: 100% 100% 95% 99%  Weight:      Height:       No intake or output data in the 24 hours ending 04/28/17 1229 Filed Weights   04/27/17 1700  Weight: 68.8 kg (151 lb 11.2 oz)    Examination:  General exam: sleeping  Respiratory system: Clear to auscultation anterior. Respiratory effort normal. Cardiovascular system: irregular and fast about 105 Gastrointestinal system: Abdomen is nondistended, soft and nontender. No organomegaly or masses felt. Normal bowel sounds heard. Central nervous system: not following commands Extremities: unable to cooperate with exam- foot drop in bed b/l Skin: No rashes, lesions or ulcers     Data Reviewed: I have personally reviewed following labs and imaging studies  CBC:  Recent Labs Lab 04/23/17 0743 04/27/17 1404 04/27/17 1420  WBC 5.4  --  2.9*  NEUTROABS  --   --  1.3*  HGB 12.5 13.9 12.6  HCT 37.4 41.0 39.1  MCV 90.1  --  90.3  PLT 150  --  208   Basic Metabolic Panel:  Recent Labs Lab 04/23/17 0743 04/27/17 1404 04/27/17 1420  NA 143 140 140  K 3.6 5.0 4.5  CL 107 107 107  CO2 27  --  25  GLUCOSE 86 89 93  BUN 29* 38* 28*  CREATININE 1.20* 1.30* 1.38*  CALCIUM 9.2  --  9.4   GFR:  Estimated Creatinine Clearance: 29.9 mL/min (A) (by C-G formula based on SCr of 1.38 mg/dL (H)). Liver Function Tests:  Recent Labs Lab 04/27/17 1420  AST 35  ALT 16  ALKPHOS 53  BILITOT 0.3  PROT 7.5  ALBUMIN 3.1*   No results for input(s): LIPASE, AMYLASE in the last 168 hours. No results for input(s): AMMONIA in the last 168 hours. Coagulation Profile: No results for input(s): INR, PROTIME in the last 168 hours. Cardiac Enzymes: No results for input(s): CKTOTAL, CKMB, CKMBINDEX, TROPONINI in the last 168 hours. BNP (last 3 results) No results for input(s): PROBNP in the last 8760 hours. HbA1C: No results for input(s): HGBA1C in the last 72 hours. CBG:  Recent Labs Lab 04/27/17 1348  GLUCAP 84   Lipid  Profile:  Recent Labs  04/28/17 0313  CHOL 145  HDL 33*  LDLCALC 98  TRIG 70  CHOLHDL 4.4   Thyroid Function Tests: No results for input(s): TSH, T4TOTAL, FREET4, T3FREE, THYROIDAB in the last 72 hours. Anemia Panel: No results for input(s): VITAMINB12, FOLATE, FERRITIN, TIBC, IRON, RETICCTPCT in the last 72 hours. Urine analysis:    Component Value Date/Time   COLORURINE YELLOW 04/19/2017 2047   APPEARANCEUR CLEAR 04/19/2017 2047   LABSPEC 1.011 04/19/2017 2047   PHURINE 5.0 04/19/2017 2047   GLUCOSEU NEGATIVE 04/19/2017 2047   HGBUR MODERATE (A) 04/19/2017 2047   BILIRUBINUR NEGATIVE 04/19/2017 2047   BILIRUBINUR small 07/29/2016 1103   KETONESUR 5 (A) 04/19/2017 2047   PROTEINUR 30 (A) 04/19/2017 2047   UROBILINOGEN 1.0 07/29/2016 1103   UROBILINOGEN 1.0 04/14/2015 1030   NITRITE POSITIVE (A) 04/19/2017 2047   LEUKOCYTESUR TRACE (A) 04/19/2017 2047     ) Recent Results (from the past 240 hour(s))  Culture, Urine     Status: Abnormal   Collection Time: 04/19/17  8:47 PM  Result Value Ref Range Status   Specimen Description URINE, RANDOM  Final   Special Requests NONE  Final   Culture >=100,000 COLONIES/mL KLEBSIELLA PNEUMONIAE (A)  Final   Report Status 04/23/2017 FINAL  Final   Organism ID, Bacteria KLEBSIELLA PNEUMONIAE (A)  Final      Susceptibility   Klebsiella pneumoniae - MIC*    AMPICILLIN >=32 RESISTANT Resistant     CEFAZOLIN <=4 SENSITIVE Sensitive     CEFTRIAXONE <=1 SENSITIVE Sensitive     CIPROFLOXACIN <=0.25 SENSITIVE Sensitive     GENTAMICIN <=1 SENSITIVE Sensitive     IMIPENEM <=0.25 SENSITIVE Sensitive     NITROFURANTOIN 64 INTERMEDIATE Intermediate     TRIMETH/SULFA >=320 RESISTANT Resistant     AMPICILLIN/SULBACTAM 8 SENSITIVE Sensitive     PIP/TAZO <=4 SENSITIVE Sensitive     Extended ESBL NEGATIVE Sensitive     * >=100,000 COLONIES/mL KLEBSIELLA PNEUMONIAE  Blood culture (routine x 2)     Status: None   Collection Time: 04/19/17  9:45  PM  Result Value Ref Range Status   Specimen Description BLOOD RIGHT WRIST  Final   Special Requests IN PEDIATRIC BOTTLE Blood Culture adequate volume  Final   Culture   Final    NO GROWTH 5 DAYS Performed at [facility], [address] [place], [place] [address]    Report Status 04/25/2017 FINAL  Final  Blood culture (routine x 2)     Status: None   Collection Time: 04/20/17  8:00 AM  Result Value Ref Range Status   Specimen Description BLOOD LEFT ARM  Final   Special Requests IN PEDIATRIC BOTTLE  Blood Culture adequate volume  Final   Culture   Final    NO GROWTH 5 DAYS Performed at [facility], [address] [place], [place] [address]    Report Status 04/25/2017 FINAL  Final      Anti-infectives    Start     Dose/Rate Route Frequency Ordered Stop   04/27/17 1745  cefUROXime (CEFTIN) tablet 250 mg    Comments:  Patient taking differently: Has 3 doses left     250 mg Oral 2 times daily with meals 04/27/17 1742 04/29/17 0759       Radiology Studies: Dg Chest Port 1 View  Result Date: 04/27/2017 CLINICAL DATA:  Altered mental status, Hx of CAD, heart failure EXAM: PORTABLE CHEST 1 VIEW COMPARISON:  Chest x-ray dated 04/14/2015. Chest x-ray dated 04/19/2017. FINDINGS: Cardiomegaly appears stable. Atherosclerotic changes noted at the aortic arch, presumed aortic ectasia. Lungs are clear. No pleural effusion or pneumothorax seen. Degenerative changes noted at the shoulders. No acute or suspicious osseous finding. Stable elevation of the left hemidiaphragm. IMPRESSION: No active disease.  No evidence of pneumonia or pulmonary edema. Stable cardiomegaly. Aortic atherosclerosis. Electronically Signed   By: Bary RichardStan  Maynard M.D.   On: 04/27/2017 14:37   Ct Head Code Stroke Wo Contrast  Result Date: 04/27/2017 CLINICAL DATA:  Code stroke.  Right-sided paralysis. EXAM: CT HEAD WITHOUT CONTRAST TECHNIQUE: Contiguous axial images were obtained from the base of the skull through  the vertex without intravenous contrast. COMPARISON:  04/19/2017 FINDINGS: Brain: Very motion degraded study, patient reportedly combative. No evidence of acute hemorrhage. There is a remote left parietal infarct that is moderate size. No interval infarct is visible. No hydrocephalus or masslike findings. Vascular: Atherosclerosis.  No evidence of hyperdense vessel. Skull: Hyperostosis.  No acute finding. Sinuses/Orbits: No acute finding Other: Prelim results sent by text page 04/27/2017 at 1:53 pm to Dr. Laurence SlateAroor. ASPECTS [facility]([place] Stroke Program Early CT Score) Not scored given the degree of artifact. IMPRESSION: 1. Combative patient with motion degraded study that could obscure pathology. 2. No acute finding. 3. Moderate remote left parietal infarct. Electronically Signed   By: Marnee SpringJonathon  Watts M.D.   On: 04/27/2017 13:55        Scheduled Meds: .  stroke: mapping our early stages of recovery book   Does not apply Once  . atorvastatin  80 mg Oral q1800  . cefUROXime  250 mg Oral BID WC  . donepezil  10 mg Oral QHS  . metoprolol tartrate  5 mg Intravenous Q6H  . cyanocobalamin  1,000 mcg Oral Daily   Continuous Infusions: . sodium chloride       LOS: 0 days    Time spent: 35 min    Bonnie Withem U Donnis Phaneuf, DO [facility] Pager [phone]  If 7PM-7AM, please contact night-coverage [email or web address] Password [email or web address] 04/28/2017, 12:29 PM

## 2017-04-28 NOTE — Progress Notes (Signed)
Attempted Carotid Duplex. Patient moving head and sitting up. Unable to complete test at this time. Please notify vascular lab if patient able to complete test.   [phone] Farrel DemarkJill Eunice, RDMS, RVT

## 2017-04-28 NOTE — Progress Notes (Signed)
Patient medicated 1mg  of ativan prior to MRI. In Mri was notified that patient was not well sedated therefore crawling in the Machine. Suggested to MRI to bring patient back to the unit so that she gets more medication. Followed after patient came back on the unit to see when she can be picked up again, was told later. Awaiting for patient to be picked up again for MRI.

## 2017-04-28 NOTE — Evaluation (Signed)
Physical Therapy Evaluation Patient Details Name: [name] MRN: [id number] DOB: 10/23/1932 Today's Date: 04/28/2017   History of Present Illness  83 y.o. female admitted on 04/27/17 for AMS.  Dx with acute encephalopathy of unclear etiology r/o stroke.  MRI pending (attempted on ativan, but pt moving too much), CT was negative.  Pt with significant PMH of PAD, memory loss, HTN, diastolic HF, chronic A-fib, CVA, and CAD.  Clinical Impression  Pt in A-fib with rate up to 145 during my limited eval at [facility].  Pt unable to report any PLOF, but per chart came from SNF.  She was able to stand and take a few side steps with significant one person assist and would be safer attempting gait with two person assist.  She is appropriate to return to SNF at discharge.   PT to follow acutely for deficits listed below.       Follow Up Recommendations SNF    Equipment Recommendations  None recommended by PT    Recommendations for Other Services   NA     Precautions / Restrictions Precautions Precautions: Fall Precaution Comments: monitor HR,, incontinent, right side seems weaker than left      Mobility  Bed Mobility Overal bed mobility: Needs Assistance Bed Mobility: Supine to Sit;Rolling Rolling: Mod assist   Supine to sit: Mod assist;HOB elevated Sit to supine: Max assist   General bed mobility comments: Mod assist to help pt complete supine to sit as she was almost already there with legs hanging over the bed rail trying to get up.  Assist needed to support trunk to come all the way up to sitting.  More assist needed to get back in bed (max) as assist for both legs and trunk, also , pt was continuing to try to get up, so she was a bit resistant to the movement back down.   Transfers Overall transfer level: Needs assistance Equipment used: 1 person hand held assist Transfers: Sit to/from Stand Sit to Stand: Mod assist         General transfer comment: Mod assist to support trunk to get  to standing, multiple sit to stands preformed as pt kept trying to stand.    Ambulation/Gait Ambulation/Gait assistance: Mod assist Ambulation Distance (Feet): 3 Feet Assistive device: 1 person hand held assist Gait Pattern/deviations: Step-to pattern     General Gait Details: Once standing pt started to side step left (towards her stronger side) down the bed, with attempts to side step right up to [facility] to get her in a good position to return to supine, pt had difficulty moving and stabilizing on her right leg and ultimately just sat back down on the bed.  Attempted again, and again she side stepped left and was unable to side step back right.       Modified Rankin (Stroke Patients Only) Modified Rankin (Stroke Patients Only) Pre-Morbid Rankin Score: Moderately severe disability Modified Rankin: Moderately severe disability     Balance Overall balance assessment: Needs assistance Sitting-balance support: Feet supported;Single extremity supported Sitting balance-Leahy Scale: Poor Sitting balance - Comments: min assist [facility].    Standing balance support: No upper extremity supported Standing balance-Leahy Scale: Poor Standing balance comment: need mod assist in standing.                              Pertinent Vitals/Pain Pain Assessment: Faces Pain Score: 0-No pain    Home Living Family/patient expects to  be discharged to:: Skilled nursing facility (Per chart from SNF- [facility])                      Prior Function           Comments: unable to assess due to no family present.         Extremity/Trunk Assessment   Upper Extremity Assessment Upper Extremity Assessment: RUE deficits/detail RUE Deficits / Details: right side seems weaker functionally she is dragging it behind as she moves to [facility].     Lower Extremity Assessment Lower Extremity Assessment: RLE deficits/detail RLE Deficits / Details: right leg also seems functionally weaker  as she is buckling over it with attempts at side stepping.      Cervical / Trunk Assessment Cervical / Trunk Assessment: Normal  Communication   Communication: Other (comment) (mumbling throughout session, incomprehensible)  Cognition Arousal/Alertness: Awake/alert Behavior During Therapy: Restless Overall Cognitive Status: No family/caregiver present to determine baseline cognitive functioning (chart reports memory loss/dementia )                                 General Comments: Pt is trying to climb out of the bed over the bed rail.       General Comments General comments (skin integrity, edema, etc.): HR at rest 120 went up to 145 max observed during limited mobility.  O2 sats 99% on RA, so left that off to help decrease restlessness and distractions from lines, and BP 160s/111s.          Assessment/Plan    PT Assessment Patient needs continued PT services  PT Problem List Decreased activity tolerance;Cardiopulmonary status limiting activity;Decreased mobility       PT Treatment Interventions DME instruction;Gait training;Functional mobility training;Therapeutic activities;Therapeutic exercise;Balance training;Neuromuscular re-education;Cognitive remediation;Patient/family education    PT Goals (Current goals can be found in the Care Plan section)  Acute Rehab PT Goals Patient Stated Goal: unable to state PT Goal Formulation: Patient unable to participate in goal setting Time For Goal Achievement: 05/12/17 Potential to Achieve Goals: Good    Frequency Min 3X/week           AM-PAC PT "6 Clicks" Daily Activity  Outcome Measure Difficulty turning over in bed (including adjusting bedclothes, sheets and blankets)?: Total Difficulty moving from lying on back to sitting on the side of the bed? : Total Difficulty sitting down on and standing up from a chair with arms (e.g., wheelchair, bedside commode, etc,.)?: Total Help needed moving to and from a bed to  chair (including a wheelchair)?: A Lot Help needed walking in hospital room?: A Lot Help needed climbing 3-5 steps with a railing? : Total 6 Click Score: 8    End of Session Equipment Utilized During Treatment: Gait belt Activity Tolerance: Other (comment) (limited by HR and restlessness.) Patient left: in bed;with call bell/phone within reach;with bed alarm set   PT Visit Diagnosis: Difficulty in walking, not elsewhere classified (R26.2)    Time: [id number] PT Time Calculation (min) (ACUTE ONLY): 14 min   Charges:   PT Evaluation $PT Eval Moderate Complexity: 1 Mod     PT G Codes:            Shanautica Forker B. Mekiah Wahler, PT, DPT [phone]   PT G-Codes **NOT FOR INPATIENT CLASS** Functional Assessment Tool Used: AM-PAC 6 Clicks Basic Mobility Functional Limitation: Mobility: Walking and moving around Mobility: Walking and Moving Around  Current Status [phone]): At least 80 percent but less than 100 percent impaired, limited or restricted Mobility: Walking and Moving Around Goal Status [phone]): At least 40 percent but less than 60 percent impaired, limited or restricted    04/28/2017, 12:26 PM

## 2017-04-28 NOTE — Evaluation (Signed)
OT Cancellation Note  Patient Details Name: Lebron QuamMary L Asleson MRN: [id number] DOB: [birth year]   Cancelled Treatment:    Reason Eval/Treat Not Completed: Medical issues which prohibited therapy; Pt currently with elevated HR, fluctuating between 125-138 at rest while supine in bed. Will follow up as schedule permits.   Marcy SirenBreanna Laydon Martis, OT Pager [phone] 04/28/2017   Orlando PennerBreanna L Calena Salem 04/28/2017, 12:34 PM

## 2017-04-28 NOTE — Procedures (Signed)
History: 83 year old female with right hemiparesis and loss of speech, very concerning for infarction on CT  Sedation: None  Technique: This is a 21 channel routine scalp EEG performed at the bedside with bipolar and monopolar montages arranged in accordance to the international 10/20 system of electrode placement. One channel was dedicated to EKG recording.    Background: There is a well defined posterior dominant rhythm of 8 Hz that attenuates with eye opening. There is also generalized irregular delta and theta activity even during maximal wakefulness. This is clearly better defined on the right than the left, with attenuation of faster frequencies particularly in the left parietotemporal region. Sleep is not recorded  Photic stimulation: Physiologic driving is now performed  EEG Abnormalities: 1) asymmetric PDR 2) generalized irregular slow activity  Clinical Interpretation: This EEG is most consistent with a focal area of cortical dysfunction in the left posterior quadrant in the setting of a more generalized mild nonspecific cerebral dysfunction (encephalopathy). There was no seizure or seizure predisposition recorded on this study. Please note that a normal EEG does not preclude the possibility of epilepsy.   Ritta SlotMcNeill Barby Colvard, MD [facility] [phone]  If 7pm- 7am, please page neurology on call as listed in AMION.

## 2017-04-28 NOTE — Evaluation (Signed)
Speech Language Pathology Evaluation Patient Details Name: ADAMARYS SHALL MRN: [id number] DOB: November 23, 1932 Today's Date: 04/28/2017 Time: 1914-7829 SLP Time Calculation (min) (ACUTE ONLY): 8 min  Problem List:  Patient Active Problem List   Diagnosis Date Noted  . Stroke (HCC) 04/27/2017  . Dehydration 04/19/2017  . Acute lower UTI 04/19/2017  . Atrial fibrillation with RVR (HCC) 04/19/2017  . AKI (acute kidney injury) (HCC) 04/19/2017  . Sepsis (HCC) 04/19/2017  . Pure hypercholesterolemia 08/27/2016  . Encounter for therapeutic drug monitoring 01/20/2014  . Dementia with behavioral disturbance 01/01/2014  . Leukopenia 12/30/2013  . Abnormal TSH 12/30/2013  . Hypokalemia 12/30/2013  . Acute encephalopathy 12/29/2013  . Atrial fibrillation with slow ventricular response (HCC) 12/29/2013  . Thrombocytopenia, unspecified (HCC) 12/29/2013  . Bradycardia 12/29/2013  . Long term (current) use of anticoagulants 01/24/2011  . Possible CAD - abnl myoview 2011, managed conservatively 01/17/2011  . PAD (peripheral artery disease) (HCC) 01/17/2011  . Chronic diastolic heart failure (HCC) 07/23/2010  . ANEMIA 06/19/2009  . HTN (hypertension) 06/02/2007   Past Medical History:  Past Medical History:  Diagnosis Date  . CAD (coronary artery disease)    a. Possible dx -  Lexiscan myoview (9/11): EF 76%, normal wall motion, possible small anterior MI with mild peri-infarct ischemia but cannot rule out breast attenuation, managed conservatively.  . Cerebrovascular accident, embolic (HCC)    left frontal  . Chronic atrial fibrillation (HCC)    a. Chronic. Has had cardioembolic right renal infarct in 12/06 and cardioembolic event to the right arm requiring right brachial embolectomy. Both events occurred while subtherapeutic on coumadin. b. Slow VR 12/2013.  . Diastolic HF (heart failure) (HCC)    Echo (7/11): EF 60-65%, moderate LVH, severe diastolic dysfunction.   . Dyslipidemia   . HTN  (hypertension)    a. Resistant HTN. Patient states that this has been poorly controlled for years.   . Iron deficiency anemia   . Memory loss   . Obese   . PAD (peripheral artery disease) (HCC)    a. peripheral arterial dopplers (5/12) with right mid popliteal artery occlusion with reconstitution of arteries below the knees by collaterals. Only mild reduction in ABI on the right (not tissue-threatening).   Past Surgical History:  Past Surgical History:  Procedure Laterality Date  . EMBOLECTOMY     right brachial embolectomy   HPI:  83 y.o. female admitted on 04/27/17 for AMS.  Dx with acute encephalopathy of unclear etiology r/o stroke.  MRI pending (attempted on ativan, but pt moving too much), CT was negative.  Pt with significant PMH of PAD, memory loss, HTN, diastolic HF, chronic A-fib, CVA, and CAD.   Assessment / Plan / Recommendation Clinical Impression  Pt is alert but does not demonstrate any orientation and does not respond to simple questioning. She does try to make some spontaneous communication but it is difficult to understand what she is trying to say - in part because she is dysarthric but also because what she is saying does not seem to be appropriate to the context of the conversation. She has R sided inattention and does not consistently focus her attention to verbal/tactile stimulation. She does not follow commands for me. It is unclear what her baseline level of function is; however during recent admission she was able to participate in swallowing evaluation. Therefore I suspect that this is at least somewhat of a decline in function. SLP will continue to follow to facilitate communication and cognition to facilitate  participation in functional, familiar tasks.     SLP Assessment  SLP Recommendation/Assessment: Patient needs continued Speech Lanaguage Pathology Services SLP Visit Diagnosis: Dysarthria and anarthria (R47.1);Cognitive communication deficit (R41.841)     Follow Up Recommendations  Skilled Nursing facility    Frequency and Duration min 2x/week  2 weeks      SLP Evaluation Cognition  Overall Cognitive Status: No family/caregiver present to determine baseline cognitive functioning (niece present but not able to give clear description) Arousal/Alertness: Awake/alert Orientation Level: Disoriented X4 Attention: Focused Focused Attention: Impaired Focused Attention Impairment: Verbal basic;Functional basic Awareness: Impaired Awareness Impairment: Emergent impairment Problem Solving: Impaired Problem Solving Impairment: Functional basic Safety/Judgment: Impaired Comments: pt trying to climb OOB upon SLP arrival       Comprehension  Auditory Comprehension Overall Auditory Comprehension: Impaired Commands: Impaired One Step Basic Commands: 0-24% accurate    Expression Expression Primary Mode of Expression: Verbal Verbal Expression Overall Verbal Expression: Impaired Initiation: No impairment Level of Generative/Spontaneous Verbalization: Phrase Other Verbal Expression Comments: pt is making attempts to communicate but it does not appear to be appropriate to context   Oral / Motor  Oral Motor/Sensory Function Overall Oral Motor/Sensory Function: Other (comment) (does not follow commands to assess) Motor Speech Overall Motor Speech: Impaired Respiration: Within functional limits Phonation: Normal Resonance: Within functional limits Articulation: Impaired Level of Impairment: Phrase Intelligibility: Intelligibility reduced Phrase: 50-74% accurate Motor Speech Errors: Not applicable   GO          Functional Assessment Tool Used: skilled clinical judgment Functional Limitations: Attention Swallow Current Status (Z6109(G8996): At least 80 percent but less than 100 percent impaired, limited or restricted Swallow Goal Status [phone]): At least 60 percent but less than 80 percent impaired, limited or restricted Attention Current  Status (U9811(G9165): At least 80 percent but less than 100 percent impaired, limited or restricted Attention Goal Status (B1478(G9166): At least 60 percent but less than 80 percent impaired, limited or restricted         Maxcine Hamaiewonsky, Nicollette Wilhelmi 04/28/2017, 4:16 PM  Maxcine HamLaura Paiewonsky, M.A. CCC-SLP [phone]

## 2017-04-28 NOTE — Progress Notes (Signed)
Patient noted with increase agiation and attempting to get ouf of bed several occasions. TELE sitter at bedside monitoring patient at this time. MD order ativan to help at this time.    Sim BoastHavy, RN

## 2017-04-28 NOTE — Evaluation (Signed)
Clinical/Bedside Swallow Evaluation Patient Details  Name: [name] MRN: [id number] Date of Birth: Jun 15, 1933  Today's Date: 04/28/2017 Time: SLP Start Time (ACUTE ONLY): 1426 SLP Stop Time (ACUTE ONLY): 1435 SLP Time Calculation (min) (ACUTE ONLY): 9 min  Past Medical History:  Past Medical History:  Diagnosis Date  . CAD (coronary artery disease)    a. Possible dx -  Lexiscan myoview (9/11): EF 76%, normal wall motion, possible small anterior MI with mild peri-infarct ischemia but cannot rule out breast attenuation, managed conservatively.  . Cerebrovascular accident, embolic (HCC)    left frontal  . Chronic atrial fibrillation (HCC)    a. Chronic. Has had cardioembolic right renal infarct in 12/06 and cardioembolic event to the right arm requiring right brachial embolectomy. Both events occurred while subtherapeutic on coumadin. b. Slow VR 12/2013.  . Diastolic HF (heart failure) (HCC)    Echo (7/11): EF 60-65%, moderate LVH, severe diastolic dysfunction.   . Dyslipidemia   . HTN (hypertension)    a. Resistant HTN. Patient states that this has been poorly controlled for years.   . Iron deficiency anemia   . Memory loss   . Obese   . PAD (peripheral artery disease) (HCC)    a. peripheral arterial dopplers (5/12) with right mid popliteal artery occlusion with reconstitution of arteries below the knees by collaterals. Only mild reduction in ABI on the right (not tissue-threatening).   Past Surgical History:  Past Surgical History:  Procedure Laterality Date  . EMBOLECTOMY     right brachial embolectomy   HPI:  83 y.o. female admitted on 04/27/17 for AMS.  Dx with acute encephalopathy of unclear etiology r/o stroke.  MRI pending (attempted on ativan, but pt moving too much), CT was negative.  Pt with significant PMH of PAD, memory loss, HTN, diastolic HF, chronic A-fib, CVA, and CAD.   Assessment / Plan / Recommendation Clinical Impression  Pt does not make any attempts to take  in POs despite stimulation and Max cueing from therapist and niece. Cognitively she is not yet ready to initiate a diet. SLP will f/u to better assess readiness. SLP Visit Diagnosis: Dysphagia, unspecified (R13.10)    Aspiration Risk  Risk for inadequate nutrition/hydration;Moderate aspiration risk    Diet Recommendation NPO   Medication Administration: Via alternative means    Other  Recommendations Oral Care Recommendations: Oral care QID   Follow up Recommendations Skilled Nursing facility      Frequency and Duration min 2x/week  2 weeks       Prognosis Prognosis for Safe Diet Advancement: Fair Barriers to Reach Goals: Cognitive deficits      Swallow Study   General HPI: 83 y.o. female admitted on 04/27/17 for AMS.  Dx with acute encephalopathy of unclear etiology r/o stroke.  MRI pending (attempted on ativan, but pt moving too much), CT was negative.  Pt with significant PMH of PAD, memory loss, HTN, diastolic HF, chronic A-fib, CVA, and CAD. Type of Study: Bedside Swallow Evaluation Previous Swallow Assessment: BSE 04/20/17 recommending Dys 3 diet and thin liquids, later downgraded to Dys 2 due to limited dentition and reduced pt intake Diet Prior to this Study: NPO Temperature Spikes Noted: No Respiratory Status: Room air History of Recent Intubation: No Behavior/Cognition: Alert;Confused;Doesn't follow directions Oral Cavity Assessment: Other (comment) (unable to visualize well) Oral Care Completed by SLP: Other (Comment) (attempted, pt does not open mouth much) Oral Cavity - Dentition: Missing dentition;Other (Comment) (very few teeth seen - maybe 2?) Self-Feeding  Abilities: Total assist Patient Positioning: Upright in bed Baseline Vocal Quality: Normal Volitional Cough: Cognitively unable to elicit Volitional Swallow: Unable to elicit    Oral/Motor/Sensory Function Overall Oral Motor/Sensory Function: Other (comment) (does not follow commands to complete)   Ice Chips  Ice chips: Impaired Presentation: Spoon Oral Phase Impairments: Poor awareness of bolus;Other (comment) (no acceptance)   Thin Liquid Thin Liquid: Not tested    Nectar Thick Nectar Thick Liquid: Not tested   Honey Thick Honey Thick Liquid: Not tested   Puree Puree: Not tested   Solid   GO   Solid: Not tested    Functional Assessment Tool Used: skilled clinical judgment Functional Limitations: Swallowing Swallow Current Status (Z6109): At least 80 percent but less than 100 percent impaired, limited or restricted Swallow Goal Status [phone]): At least 60 percent but less than 80 percent impaired, limited or restricted   Maxcine Ham 04/28/2017,4:08 PM   Maxcine Ham, M.A. CCC-SLP [phone]

## 2017-04-28 NOTE — Progress Notes (Signed)
  Echocardiogram 2D Echocardiogram has been performed.  [name] 04/28/2017, 1:48 PM

## 2017-04-28 NOTE — Care Management Note (Addendum)
Case Management Note  Patient Details  Name: [name] MRN: [id number] Date of Birth: 1933/04/07  Subjective/Objective:     Pt in to r/o CVA. [facility]             Action/Plan: Pt unable to cooperate with MRI and carotid US. Palliative consulted for GOC. Also awaiting PT/OT recs. CM following.  Expected Discharge Date:                  Expected Discharge Plan:     In-House Referral:     Discharge planning Services     Post Acute Care Choice:    Choice offered to:     DME Arranged:    DME Agency:     HH Arranged:    HH Agency:     Status of Service:  In process, will continue to follow  If discussed at Long Length of Stay Meetings, dates discussed:    Additional Comments:  Kermit BaloKelli F Sugar Vanzandt, RN 04/28/2017, 11:45 AM

## 2017-04-28 NOTE — Evaluation (Signed)
Occupational Therapy Evaluation Patient Details Name: [name] MRN: [id number] DOB: 1933-06-05 Today's Date: 04/28/2017    History of Present Illness 83 y.o. female admitted on 04/27/17 for AMS.  Dx with acute encephalopathy of unclear etiology r/o stroke.  MRI pending (attempted on ativan, but pt moving too much), CT was negative.  Pt with significant PMH of PAD, memory loss, HTN, diastolic HF, chronic A-fib, CVA, and CAD.   Clinical Impression   This 83 y/o F presents with the above. Limited eval as Pt with cognitive impairments and significant difficulty following one step commands during session. Pt currently requires MaxA for bed mobility, MinA for static sitting EOB, MaxA for UB ADLs, and totalA for LB ADLs. Pt will benefit from continued acute OT services and recommend Pt receive ST rehab OT services in SNF setting to maximize Pt's overall safety and independence with ADLs and functional mobility as well as to decrease level of caregiver assist.     Follow Up Recommendations  SNF;Supervision/Assistance - 24 hour    Equipment Recommendations  Other (comment) (TBD in next venue )           Precautions / Restrictions Precautions Precautions: Fall Precaution Comments: monitor HR,, incontinent, right side seems weaker than left Restrictions Weight Bearing Restrictions: No      Mobility Bed Mobility Overal bed mobility: Needs Assistance Bed Mobility: Supine to Sit;Sit to Supine     Supine to sit: HOB elevated;Max assist Sit to supine: Max assist   General bed mobility comments: MaxA, encouragement, multimodal cues to bring LEs over EOB, assist to bring trunk into upright position and steady upon sitting; Pt initiates returning to supine, requires assist for LE management and assist for turning trunk to [facility] prior to fully reaching supine  Transfers                 General transfer comment: OOB mobility not attempted this session due to safety concerns, Pt attempting  to return to supine after sitting EOB, unable to follow commands to attempt OOB mobility    Balance Overall balance assessment: Needs assistance Sitting-balance support: Feet supported;Single extremity supported Sitting balance-Leahy Scale: Poor Sitting balance - Comments: min assist EOB.                                    ADL either performed or assessed with clinical judgement   ADL Overall ADL's : Needs assistance/impaired Eating/Feeding: Bed level;Moderate assistance   Grooming: Maximal assistance;Sitting;Wash/dry face;Cueing for sequencing Grooming Details (indicate cue type and reason): Pt requires Max HOH assist to complete washing face while seated EOB Upper Body Bathing: Sitting;Moderate assistance;Cueing for sequencing   Lower Body Bathing: Maximal assistance;Cueing for sequencing;Sitting/lateral leans   Upper Body Dressing : Moderate assistance;Sitting;Cueing for sequencing   Lower Body Dressing: Maximal assistance;Cueing for sequencing;Sitting/lateral leans       Toileting- Clothing Manipulation and Hygiene: Total assistance;Bed level;Cueing for sequencing         General ADL Comments: Pt is able to maintain static sitting EOB during session, Pt inconsistently follows one step commands and does so very minimally, Pt attempting to return to supine after sitting EOB for brief period of time and initiating laying back into supine, therefore OOB mobility not attempted this session due to safety concerns and Pt not able to fully follow/understand commands      Vision   Additional Comments: Upon entering room Pt initially looking  to left (towards niece), does not turn head or respond to name being called while therapist does so standing to R of bed, makes eye contact with therapist when therapist approaches Pt to the L; unclear whether this is cognitive or vision related - to be further assessed                 Pertinent Vitals/Pain Pain Assessment:  Faces Faces Pain Scale: No hurt          Extremity/Trunk Assessment Upper Extremity Assessment Upper Extremity Assessment: RUE deficits/detail RUE Deficits / Details: RUE appears weaker, PROM appears [facility] for both L and R UE, During LUE shoulder flexion Pt is able to briefly maintain holding LUE in position and control descent of LUE back onto bed, was not able to do this for RUE   Lower Extremity Assessment Lower Extremity Assessment: Defer to PT evaluation   Cervical / Trunk Assessment Cervical / Trunk Assessment: Normal   Communication Communication Communication: Other (comment) (mumbling throughout session, unable to articulate clear sentences )   Cognition Arousal/Alertness: Awake/alert Behavior During Therapy: Restless Overall Cognitive Status: No family/caregiver present to determine baseline cognitive functioning (niece present but not able to give clear description) Area of Impairment: Attention;Awareness                   Current Attention Level: Focused       Awareness: Intellectual   General Comments: Pt very minimally follows one step commands with max multimodal cues                    Home Living Family/patient expects to be discharged to:: Skilled nursing facility (per chart Pt was at [facility] [employer] prior to admission, was previously living with son)                                 Additional Comments: Pt's neice present during session, reports Pt was most recently receiving therapy at SNF, was living with son prior to this, niece is unsure of home setup      Prior Functioning/Environment          Comments: Prior to this admission Pt was at SNF, Pt's niece present and reporting prior to this SNF stay Pt was living with her son, reports Pt was able to complete ADLs and functional mobility with supervision at that time, unsure as to Pt's level of function while in SNF leading up to this hospital admission        OT  Problem List: Decreased strength;Impaired balance (sitting and/or standing);Decreased activity tolerance;Decreased cognition;Decreased safety awareness      OT Treatment/Interventions: Self-care/ADL training;DME and/or AE instruction;Therapeutic activities;Balance training;Therapeutic exercise;Cognitive remediation/compensation;Neuromuscular education;Visual/perceptual remediation/compensation;Patient/family education    OT Goals(Current goals can be found in the care plan section) Acute Rehab OT Goals Patient Stated Goal: unable to state OT Goal Formulation: Patient unable to participate in goal setting Time For Goal Achievement: 05/12/17 Potential to Achieve Goals: Fair  OT Frequency: Min 2X/week                             AM-PAC PT "6 Clicks" Daily Activity     Outcome Measure Help from another person eating meals?: A Lot Help from another person taking care of personal grooming?: A Lot Help from another person toileting, which includes using toliet, bedpan, or urinal?: Total Help from  another person bathing (including washing, rinsing, drying)?: A Lot Help from another person to put on and taking off regular upper body clothing?: A Lot Help from another person to put on and taking off regular lower body clothing?: Total 6 Click Score: 10   End of Session Nurse Communication: Mobility status  Activity Tolerance: Patient tolerated treatment well Patient left: in bed;with call bell/phone within reach;with bed alarm set;with nursing/sitter in room;with family/visitor present  OT Visit Diagnosis: Unsteadiness on feet (R26.81);Cognitive communication deficit (R41.841);Muscle weakness (generalized) (M62.81)                Time: [id number] OT Time Calculation (min): 24 min Charges:  OT General Charges $OT Visit: 1 Procedure OT Evaluation $OT Eval Moderate Complexity: 1 Procedure G-Codes: OT G-codes **NOT FOR INPATIENT CLASS** Functional Assessment Tool Used: AM-PAC 6  Clicks Daily Activity;Clinical judgement Functional Limitation: Self care Self Care Current Status (V4098(G8987): At least 80 percent but less than 100 percent impaired, limited or restricted Self Care Goal Status (J1914(G8988): At least 40 percent but less than 60 percent impaired, limited or restricted   Marcy SirenBreanna Ebony Yorio, OT Pager [phone] 04/28/2017   Orlando PennerBreanna L Amaad Byers 04/28/2017, 4:50 PM

## 2017-04-28 NOTE — Progress Notes (Signed)
EEG completed, results pending. 

## 2017-04-28 NOTE — Progress Notes (Signed)
STROKE TEAM PROGRESS NOTE   HISTORY OF PRESENT ILLNESS (per record)  [name] is an 83 y.o. female history of advanced dementia, coronary artery disease, atrial fibrillation, prior stroke was brought in as a stroke alert after her nurse found her to have a facial droop and leaning to right side at 12:55 PM. She takes Xarelto and last dose was 4:35 PM. At baseline in the nursing home, she cannot have a conversation but can intermittently speak some words. She she can only follow simple commands, needs assistance to eat and use bathroom, and can walk with someone helping her.   She arrived at 1:25 PM to [facility]. She was aphasic, with right arm weakness and right facial droop. He CT head was very limited due to motion artifact but was negative for hemorrhage.  Not IA candidate due to poor baseline MRS.  Date last known well: 04/27/17 Time last known well: 12.55 pm NIHSS: 21 Modified Rankin: 4  Patient was not administered IV t-PA secondary to being on oral anticoagulation. She was admitted to General Neurology for further evaluation and treatment.   SUBJECTIVE (INTERVAL HISTORY) She is alone in the room.  The patient is awake, confused, and only occasionally follows commands with frequent prompting, which is consistent with her baseline.    OBJECTIVE Temp:  [97.5 F (36.4 C)-98.5 F (36.9 C)] 98.5 F (36.9 C) (08/06 1511) Pulse Rate:  [70-145] 90 (08/06 1511) Cardiac Rhythm: Normal sinus rhythm (08/06 0702) Resp:  [15-20] 18 (08/06 1511) BP: (96-167)/(69-111) 167/98 (08/06 1511) SpO2:  [95 %-100 %] 98 % (08/06 1511) FiO2 (%):  [2 %] 2 % (08/06 1003) Weight:  [68.8 kg (151 lb 11.2 oz)] 68.8 kg (151 lb 11.2 oz) (08/05 1700)  CBC:  Recent Labs Lab 04/23/17 0743 04/27/17 1404 04/27/17 1420  WBC 5.4  --  2.9*  NEUTROABS  --   --  1.3*  HGB 12.5 13.9 12.6  HCT 37.4 41.0 39.1  MCV 90.1  --  90.3  PLT 150  --  208    Basic Metabolic Panel:  Recent Labs Lab  04/23/17 0743 04/27/17 1404 04/27/17 1420  NA 143 140 140  K 3.6 5.0 4.5  CL 107 107 107  CO2 27  --  25  GLUCOSE 86 89 93  BUN 29* 38* 28*  CREATININE 1.20* 1.30* 1.38*  CALCIUM 9.2  --  9.4    Lipid Panel:    Component Value Date/Time   CHOL 145 04/28/2017 0313   TRIG 70 04/28/2017 0313   HDL 33 (L) 04/28/2017 0313   CHOLHDL 4.4 04/28/2017 0313   VLDL 14 04/28/2017 0313   LDLCALC 98 04/28/2017 0313   HgbA1c:  Lab Results  Component Value Date   HGBA1C 5.4 12/29/2013   Urine Drug Screen:    Component Value Date/Time   LABOPIA NONE DETECTED 12/07/2013 1658   COCAINSCRNUR NONE DETECTED 12/07/2013 1658   LABBENZ NONE DETECTED 12/07/2013 1658   AMPHETMU NONE DETECTED 12/07/2013 1658   THCU NONE DETECTED 12/07/2013 1658   LABBARB NONE DETECTED 12/07/2013 1658    Alcohol Level     Component Value Date/Time   ETH <11 12/07/2013 1705    IMAGING  Ct Head Wo Contrast 04/28/2017 IMPRESSION: 1. Loss of gray-white differentiation along the left posterior insular ribbon is concerning for progressive left MCA territory infarct. 2. More remote left parietal lobe infarct is stable. 3. Diffuse white matter disease likely reflects the sequela of chronic microvascular ischemia.  Dg Chest Port 1 View 04/27/2017 IMPRESSION: No active disease.  No evidence of pneumonia or pulmonary edema. Stable cardiomegaly. Aortic atherosclerosis.  Ct Head Code Stroke Wo Contrast 04/27/2017 IMPRESSION: 1. Combative patient with motion degraded study that could obscure pathology. 2. No acute finding. 3. Moderate remote left parietal infarct.  TTE 04/28/2017  pending  Carotid US 04/28/2017  pending  EEG 04/28/2017  pending     PHYSICAL EXAM Frail cachectic elderly African-American lady not in distress. . Afebrile. Head is nontraumatic. Neck is supple without bruit.    Cardiac exam no murmur or gallop. Lungs are clear to auscultation. Distal pulses are well felt.  Neurological Exam :    Patient is awake alert. She can follow gaze but does not follow commands. She does not speak any words. She makes some guttural sounds. She blinks to threat on either side. Pupils irregular reactive. Fundi could not be visualized. She does not follow any commands. She withdraws left side more than right side to noxious stimuli. She is not cooperative for detailed motor testing. Deep tendon reflexes are depressed on the right compared to the left. Both plantars are upgoing. Gait was not tested. ASSESSMENT/PLAN [name] is a 83 y.o. female with history of advanced dementia, coronary artery disease, atrial fibrillation, prior stroke was brought in as a stroke alert after her nurse found her to have a facial droop and leaning to right side at 12:55 PM. She did not receive IV t-PA due to being on oral anticoagulation.   Stroke vs. Seizure: Loss of gray-white differentiation along the left posterior insular ribbon concerning for progressive left MCA territory infarct in setting advanced small vessel disease and atrial fibrillation on Xarelto.  Resultant   right hemiparesis and loss of speech  CT head: Loss of gray-white differentiation along the left posterior insular ribbon is concerning for progressive left MCA territory infarct.   MRI head  unable to obtain due to patient dementia/non-cooperation  MRA head  not ordered  Carotid Doppler   pending  2D Echo   pending   LDL 98  HgbA1c 5.4  SCDs for VTE prophylaxis  Diet NPO time specified  Xarelto (rivaroxaban) daily prior to admission, now on No antithrombotic  Patient counseled to be compliant with her antithrombotic medications  Ongoing aggressive stroke risk factor management  Therapy recommendations:   pending  Disposition:   pending  Hypertension  Stable Permissive hypertension (OK if < 220/120) but gradually normalize in 5-7 days Long-term BP goal normotensive  Hyperlipidemia  Home meds: atorvastatin 80mg  PO  daily, reordered in hospital  LDL 98, goal < 70  Continue statin at discharge  Other Stroke Risk Factors  Advanced age  Hx stroke/TIA  Coronary artery disease  Other Active Problems  Dementia: continue home donepazil  UTI: UA with + bacteria and nitrites on 7/28, treating with cefuroxime  Hospital day # 0  I have personally examined this patient, reviewed notes, independently viewed imaging studies, participated in medical decision making and plan of care.ROS completed by me personally and pertinent positives fully documented  I have made any additions or clarifications directly to the above note. Patient has significant residual dementia and right hemiparesis and is comfortable worsening of her symptoms possibly from new left brain infarct versus aggravation of old deficits in the setting of bladder infection. Patient is unlikely to be cooperative for an MRI scan hence we'll cancel it and instead repeat a CT scan. Check EEG for seizures as well. No  family available at the bedside for discussion. Discussed with Dr. Benjamine Molavann. Greater than 50% time during this 25 minute visit was spent in counseling and coordination of care about her presentation and diagnostic workup.  Delia HeadyPramod Sethi, MD Medical Director [facility] Pager: [phone] 04/28/2017 4:49 PM   To contact Stroke Continuity provider, please refer to [email or web address]. After hours, contact General Neurology

## 2017-04-29 DIAGNOSIS — F0391 Unspecified dementia with behavioral disturbance: Secondary | ICD-10-CM

## 2017-04-29 DIAGNOSIS — R400 Somnolence: Secondary | ICD-10-CM

## 2017-04-29 DIAGNOSIS — Z515 Encounter for palliative care: Secondary | ICD-10-CM

## 2017-04-29 DIAGNOSIS — R531 Weakness: Secondary | ICD-10-CM

## 2017-04-29 LAB — BASIC METABOLIC PANEL
ANION GAP: 11 (ref 5–15)
BUN: 12 mg/dL (ref 6–20)
CO2: 26 mmol/L (ref 22–32)
Calcium: 9.2 mg/dL (ref 8.9–10.3)
Chloride: 104 mmol/L (ref 101–111)
Creatinine, Ser: 0.93 mg/dL (ref 0.44–1.00)
GFR calc non Af Amer: 56 mL/min — ABNORMAL LOW (ref >60)
Glucose, Bld: 75 mg/dL (ref 65–99)
POTASSIUM: 3.7 mmol/L (ref 3.5–5.1)
SODIUM: 141 mmol/L (ref 135–145)

## 2017-04-29 LAB — CBC
HCT: 38.6 % (ref 36.0–46.0)
Hemoglobin: 12.2 g/dL (ref 12.0–15.0)
MCH: 28.4 pg (ref 26.0–34.0)
MCHC: 31.6 g/dL (ref 30.0–36.0)
MCV: 89.8 fL (ref 78.0–100.0)
PLATELETS: 214 10*3/uL (ref 150–400)
RBC: 4.3 MIL/uL (ref 3.87–5.11)
RDW: 13.3 % (ref 11.5–15.5)
WBC: 3.5 10*3/uL — AB (ref 4.0–10.5)

## 2017-04-29 LAB — ECHOCARDIOGRAM COMPLETE
HEIGHTINCHES: 64 in
WEIGHTICAEL: 2427.2 [oz_av]

## 2017-04-29 LAB — HEMOGLOBIN A1C
Hgb A1c MFr Bld: 5.3 % (ref 4.8–5.6)
Mean Plasma Glucose: 105 mg/dL

## 2017-04-29 MED ORDER — ENOXAPARIN SODIUM 40 MG/0.4ML ~~LOC~~ SOLN
40.0000 mg | SUBCUTANEOUS | Status: DC
  Administered 2017-04-29 – 2017-04-30 (×2): 40 mg via SUBCUTANEOUS
  Filled 2017-04-29 (×2): qty 0.4

## 2017-04-29 NOTE — Consult Note (Addendum)
Consultation Note Date: 04/29/2017   Patient Name: [name]  DOB: [birth year]  MRN: [id number]  Age / Sex: 83 y.o., female  PCP: System, Provider Not In Referring Physician: Joseph Art, DO   Reason for Consultation: Establishing goals of care  HPI/Patient Profile: 83 y.o. female  with past medical history of HTN, prior stroke, Diastolic HF (EF of 55%), atrial fibrillation, dementia and a recent discharge on 8/1 after UTI with sepsis who was admitted on 04/27/2017 with altered mental status. Her recent Echo shoes Pulmonary artery pressure of 53 mmHG (moderate pulmonary HTN), EF of 55% and LVH.  She has been agitated and it has been difficult to assess her with imaging. CT shows "loss of grey-white matter along left posterior insular ribbon concerning for progressive left MSA territory infarct."  Neurology is concerned for a left MCA stroke but reports that she is not a candidate for any intervention.  Speech reports that she has poor awareness of food bolus and they were unable to test multiple foods. They have noted a decline from the last swallow study.  Her albumin is 3.1. According to nursing staff she sleeps all day and is awake and agitated at night.   Clinical Assessment and Goals of Care:  I have reviewed medical records including EPIC notes, labs and imaging, received report from her sitter, assessed the patient and made an attempt to contact her son and her daughter  to discuss diagnosis prognosis, GOC, EOL wishes, disposition and options.   As far as previous functional and nutritional status, per notes, she has had to have help eating at the SNF. She currently lives at [facility].    Primary Decision Maker:  NEXT OF KINEthelene Browns, Patient's son     SUMMARY OF RECOMMENDATIONS    Plan to meet with family on 8/8.  Targeting 2:00 pm.  Code Status/Advance Care Planning:  Full   Symptom  Management:   Continue per primary team.  Patient appears comfortable.  Sitter appropriate.  Additional Recommendations (Limitations, Scope, Preferences):  Careful hand feeding if family will accept the risk of aspiration (PEG is contra-indicated)  Palliative Prophylaxis:   Aspiration and Delirium Protocol  Prognosis:   Weeks- due to inability to eat/swallow, end-stage dementia.   Discharge Planning: To Be Determined      Primary Diagnoses: Present on Admission: . Stroke (HCC) . Atrial fibrillation with RVR (HCC) . AKI (acute kidney injury) (HCC) . Acute encephalopathy   I have reviewed the medical record, interviewed the patient and family, and examined the patient. The following aspects are pertinent.  Past Medical History:  Diagnosis Date  . CAD (coronary artery disease)    a. Possible dx -  Lexiscan myoview (9/11): EF 76%, normal wall motion, possible small anterior MI with mild peri-infarct ischemia but cannot rule out breast attenuation, managed conservatively.  . Cerebrovascular accident, embolic (HCC)    left frontal  . Chronic atrial fibrillation (HCC)    a. Chronic. Has had cardioembolic right renal infarct in 12/06 and  cardioembolic event to the right arm requiring right brachial embolectomy. Both events occurred while subtherapeutic on coumadin. b. Slow VR 12/2013.  . Diastolic HF (heart failure) (HCC)    Echo (7/11): EF 60-65%, moderate LVH, severe diastolic dysfunction.   . Dyslipidemia   . HTN (hypertension)    a. Resistant HTN. Patient states that this has been poorly controlled for years.   . Iron deficiency anemia   . Memory loss   . Obese   . PAD (peripheral artery disease) (HCC)    a. peripheral arterial dopplers (5/12) with right mid popliteal artery occlusion with reconstitution of arteries below the knees by collaterals. Only mild reduction in ABI on the right (not tissue-threatening).   Social History   Social History  . Marital status:  Widowed    Spouse name: N/A  . Number of children: 3  . Years of education: HS   Occupational History  . Retired    Social History Main Topics  . Smoking status: Never Smoker  . Smokeless tobacco: Never Used  . Alcohol use No  . Drug use: No  . Sexual activity: Not on file   Other Topics Concern  . Not on file   Social History Narrative   Lives with her sons.   Right-handed.   No caffeine use.   Family History  Problem Relation Age of Onset  . Anemia Father   . Asthma Father   . Hypertension Mother    Scheduled Meds: .  stroke: mapping our early stages of recovery book   Does not apply Once  . atorvastatin  80 mg Oral q1800  . donepezil  10 mg Oral QHS  . metoprolol tartrate  5 mg Intravenous Q6H  . cyanocobalamin  1,000 mcg Oral Daily   Continuous Infusions: . sodium chloride 75 mL/hr at 04/28/17 2353   PRN Meds:.acetaminophen **OR** acetaminophen (TYLENOL) oral liquid 160 mg/5 mL **OR** acetaminophen No Known Allergies Review of Systems Unable to obtain due to patients mental status   Physical Exam The patient is resting in bed with a sitter at bedside. She appears frail. She has poor dentition and swollen/inflammed gum around front right tooth.  She is difficult to arouse.  CV: irregular rate, irregular rhythm. No murmur appreciated.  Pulm: decreased breath sounds bilaterally  GI: active bowel sounds   Vital Signs: BP (!) 159/90 (BP Location: Right Arm)   Pulse 91   Temp 97.8 F (36.6 C) (Axillary)   Resp 18   Ht 5\' 4"  (1.626 m)   Wt 68.8 kg (151 lb 11.2 oz)   SpO2 91%   BMI 26.04 kg/m  Pain Assessment: Faces   Pain Score: Asleep   SpO2: SpO2: 91 % O2 Device:SpO2: 91 % O2 Flow Rate: .O2 Flow Rate (L/min): 2 L/min  IO: Intake/output summary:  Intake/Output Summary (Last 24 hours) at 04/29/17 1019 Last data filed at 04/29/17 0700  Gross per 24 hour  Intake           1367.5 ml  Output                0 ml  Net           1367.5 ml    LBM:  Last BM Date:  (pt unable to report) Baseline Weight: Weight: 68.8 kg (151 lb 11.2 oz) Most recent weight: Weight: 68.8 kg (151 lb 11.2 oz)     Palliative Assessment/Data: 10%     Time In: 10:00 Time Out: 10:30 Time Total:  30 min. Greater than 50%  of this time was spent counseling and coordinating care related to the above assessment and plan.  Signed by: Caryl Never PA-S  Norvel Richards, PA-C Palliative Medicine Pager: [phone]  Please contact Palliative Medicine Team phone at [phone] for questions and concerns.  For individual provider: See Loretha Stapler

## 2017-04-29 NOTE — Progress Notes (Signed)
STROKE TEAM PROGRESS NOTE   HISTORY OF PRESENT ILLNESS (per record)  [name] is an 83 y.o. female history of advanced dementia, coronary artery disease, atrial fibrillation, prior stroke was brought in as a stroke alert after her nurse found her to have a facial droop and leaning to right side at 12:55 PM. She takes Xarelto and last dose was 4:35 PM. At baseline in the nursing home, she cannot have a conversation but can intermittently speak some words. She she can only follow simple commands, needs assistance to eat and use bathroom, and can walk with someone helping her.   She arrived at 1:25 PM to [facility] ER. She was aphasic, with right arm weakness and right facial droop. He CT head was very limited due to motion artifact but was negative for hemorrhage.  Not IA candidate due to poor baseline MRS.  Date last known well: 04/27/17 Time last known well: 12.55 pm NIHSS: 21 Modified Rankin: 4  Patient was not administered IV t-PA secondary to being on oral anticoagulation. She was admitted to General Neurology for further evaluation and treatment.   SUBJECTIVE (INTERVAL HISTORY) She has a Comptroller in the room.  The patient remains, confused, and only occasionally follows commands  OBJECTIVE Temp:  [97.8 F (36.6 C)-98.5 F (36.9 C)] 97.8 F (36.6 C) (08/07 0932) Pulse Rate:  [90-120] 91 (08/07 0932) Cardiac Rhythm: Atrial fibrillation (08/07 0703) Resp:  [18-24] 18 (08/07 0932) BP: (92-169)/(63-114) 159/90 (08/07 0932) SpO2:  [91 %-100 %] 91 % (08/07 0932)  CBC:   Recent Labs Lab 04/27/17 1420 04/29/17 0343  WBC 2.9* 3.5*  NEUTROABS 1.3*  --   HGB 12.6 12.2  HCT 39.1 38.6  MCV 90.3 89.8  PLT 208 214    Basic Metabolic Panel:   Recent Labs Lab 04/27/17 1420 04/29/17 0343  NA 140 141  K 4.5 3.7  CL 107 104  CO2 25 26  GLUCOSE 93 75  BUN 28* 12  CREATININE 1.38* 0.93  CALCIUM 9.4 9.2    Lipid Panel:     Component Value Date/Time   CHOL 145 04/28/2017  0313   TRIG 70 04/28/2017 0313   HDL 33 (L) 04/28/2017 0313   CHOLHDL 4.4 04/28/2017 0313   VLDL 14 04/28/2017 0313   LDLCALC 98 04/28/2017 0313   HgbA1c:  Lab Results  Component Value Date   HGBA1C 5.3 04/28/2017   Urine Drug Screen:     Component Value Date/Time   LABOPIA NONE DETECTED 12/07/2013 1658   COCAINSCRNUR NONE DETECTED 12/07/2013 1658   LABBENZ NONE DETECTED 12/07/2013 1658   AMPHETMU NONE DETECTED 12/07/2013 1658   THCU NONE DETECTED 12/07/2013 1658   LABBARB NONE DETECTED 12/07/2013 1658    Alcohol Level     Component Value Date/Time   ETH <11 12/07/2013 1705    IMAGING  Ct Head Wo Contrast 04/28/2017 IMPRESSION: 1. Loss of gray-white differentiation along the left posterior insular ribbon is concerning for progressive left MCA territory infarct. 2. More remote left parietal lobe infarct is stable. 3. Diffuse white matter disease likely reflects the sequela of chronic microvascular ischemia.   Dg Chest Port 1 View 04/27/2017 IMPRESSION: No active disease.  No evidence of pneumonia or pulmonary edema. Stable cardiomegaly. Aortic atherosclerosis.  Ct Head Code Stroke Wo Contrast 04/27/2017 IMPRESSION: 1. Combative patient with motion degraded study that could obscure pathology. 2. No acute finding. 3. Moderate remote left parietal infarct.  TTE 04/28/2017  pending  Carotid US 04/28/2017  pending  EEG 04/28/2017  pending     PHYSICAL EXAM Frail cachectic elderly African-American lady not in distress. . Afebrile. Head is nontraumatic. Neck is supple without bruit.    Cardiac exam no murmur or gallop. Lungs are clear to auscultation. Distal pulses are well felt.  Neurological Exam :   Patient is awake alert. She can follow gaze but does not follow commands. She does not speak any words. She makes some guttural sounds. She blinks to threat on either side. Pupils irregular reactive. Fundi could not be visualized. She does not follow any commands. She withdraws  left side more than right side to noxious stimuli. She is not cooperative for detailed motor testing. Deep tendon reflexes are depressed on the right compared to the left. Both plantars are upgoing. Gait was not tested. ASSESSMENT/PLAN [name] is a 83 y.o. female with history of advanced dementia, coronary artery disease, atrial fibrillation, prior stroke was brought in as a stroke alert after her nurse found her to have a facial droop and leaning to right side at 12:55 PM. She did not receive IV t-PA due to being on oral anticoagulation.   Stroke vs. Seizure: Loss of gray-white differentiation along the left posterior insular ribbon concerning for progressive left MCA territory infarct in setting advanced small vessel disease and atrial fibrillation on Xarelto.  Resultant   right hemiparesis and loss of speech  CT head: Loss of gray-white differentiation along the left posterior insular ribbon is concerning for progressive left MCA territory infarct.   MRI head  unable to obtain due to patient dementia/non-cooperation  MRA head  not ordered  Carotid Doppler   pending 2D Echo   Not cooperative  LDL 98  HgbA1c 5.4  SCDs for VTE prophylaxis Diet NPO time specified  Xarelto (rivaroxaban) daily prior to admission, now on No antithrombotic  Patient counseled to be compliant with her antithrombotic medications  Ongoing aggressive stroke risk factor management  Therapy recommendations:   pending  Disposition:   pending  Hypertension  Stable Permissive hypertension (OK if < 220/120) but gradually normalize in 5-7 days Long-term BP goal normotensive  Hyperlipidemia  Home meds: atorvastatin 80mg  PO daily, reordered in hospital  LDL 98, goal < 70  Continue statin at discharge  Other Stroke Risk Factors  Advanced age  Hx stroke/TIA  Coronary artery disease  Other Active Problems  Dementia: continue home donepazil  UTI: UA with + bacteria and nitrites on 7/28,  treating with cefuroxime  Hospital day # 1  I have personally examined this patient, reviewed notes, independently viewed imaging studies, participated in medical decision making and plan of care.ROS completed by me personally and pertinent positives fully documented  I have made any additions or clarifications directly to the above note. Patient has significant residual dementia and right hemiparesis and  worsening of her symptoms possibly from new left brain infarct versus aggravation of old deficits in the setting of bladder infection. Patient has not been cooperative for tests repeat CT scan shows left periinsular embolic infarct likely from her atrial fibrillation.   EEG  Shows focal slowing in the left hemisphere but without definite evidence of seizures   No family available at the bedside for discussion. Discussed with Dr. Benjamine Mola. She will likely Feeding tube and nursing home placement. Family needs to seriously consider comfort care and palliative care which may be more appropriate for her Greater than 50% time during this 25 minute visit was spent in counseling and coordination  of care about her presentation and diagnostic workup.  Delia HeadyPramod Sethi, MD Medical Director [facility] Pager: [phone] 04/29/2017 1:43 PM   To contact Stroke Continuity provider, please refer to [email or web address]. After hours, contact General Neurology

## 2017-04-29 NOTE — Progress Notes (Signed)
  Speech Language Pathology Treatment: Dysphagia;Cognitive-Linquistic  Patient Details Name: [name] MRN: [id number] DOB: 09-Dec-1932 Today's Date: 04/29/2017 Time: 0920-0933 SLP Time Calculation (min) (ACUTE ONLY): 13 min  Assessment / Plan / Recommendation Clinical Impression  Pt is drowsy this morning but awakens with tactile stimulation. She does not follow commands despite Max cues. Pt showed limited awareness of toothbrush in mouth as evidence by her attempting to talk around it. She showed no evidence of awareness of acceptance of POs brought to her lips. Cognitively she remains inappropriate for POs. Given that her dysphagia appears to be at least largely related to cognition, may be beneficial to discuss GOC with family, as I suspect that even if we are able to initiate a PO diet, she will likely not have sufficient intake.   HPI HPI: 83 y.o. female admitted on 04/27/17 for AMS.  Dx with acute encephalopathy of unclear etiology r/o stroke.  MRI pending (attempted on ativan, but pt moving too much), CT was negative.  Pt with significant PMH of PAD, memory loss, HTN, diastolic HF, chronic A-fib, CVA, and CAD.      SLP Plan  Continue with current plan of care       Recommendations  Diet recommendations: NPO Medication Administration: Via alternative means                Oral Care Recommendations: Oral care QID Follow up Recommendations: Skilled Nursing facility SLP Visit Diagnosis: Cognitive communication deficit (R41.841);Dysphagia, oral phase (R13.11) Plan: Continue with current plan of care       GO                [name], [name] 04/29/2017, 9:41 AM  Bonnie HamLaura Stephens, M.A. CCC-SLP [phone]

## 2017-04-29 NOTE — Progress Notes (Signed)
PROGRESS NOTE    [name]  ZOX:[id number] DOB: 1933/04/04 DOA: 04/27/2017 PCP: System, Provider Not In   Outpatient Specialists:     Brief Narrative:  [name] is a 83 y.o. female with medical history significant of hypertension. Chronic diastolic heart failure, atrial fibrillation, recent admission for UTI and sepsis, was discharged on 8/1 , sent back from SNF for altered mental status, . History is minimal as she is somnolent, would respond to verbal cues but wouldn ot give me any history sec to dementia.  She was seen normal around 12 and was leaning to the right. She was sent to ED for evaluation of stroke.  Most of the history is from EDP and snf notes.    Assessment & Plan:   Active Problems:   Acute encephalopathy   Atrial fibrillation with RVR (HCC)   AKI (acute kidney injury) (HCC)   Stroke [facility])   CVA Initial CT head negative for acute pathology- unable to do MRI due to patient not being able to cooperate but repeat echo shows: Loss of gray-white differentiation along the left posterior insular ribbon is concerning for progressive left MCA territory infarct. Recent echo, so will not replete.  Unable to get carotid duplex as patient would not cooperate  LDL: 98-- patient NPO so unable to give statin HgbA1C: pending Neurology consulted  -EEG not consistent with seizures -palliative care consult for GOC- unable to reach family to discuss  Dementia- severe:  Resume aricept if she passes swallow screen.   Hypertension: permissive hypertension.   afib with RVR:  On xarelto for anticoagulation, which is being held as patient NPO -IV BB while NPO  Elevated troponin:  -decreased from last admission  Acute kidney injury:  Hold ace inhibitor  Resolved with hydration  abnormal TSH/free t4 -outpatient follow up   DVT prophylaxis:  SCD's  Code Status: Full Code   Family Communication: Called multiple family members, no answer  Disposition  Plan:  Poor overall outcome: comfort care--? Back to SNF   Consultants:   Neuro  Palliative care    Subjective: Per nursing was up all night trying to get out of bed  Objective: Vitals:   04/28/17 2130 04/29/17 0003 04/29/17 0641 04/29/17 0932  BP: (!) 161/90 97/63 92/75  (!) 159/90  Pulse: 90 (!) 106 91 91  Resp:  18  18  Temp:  98.2 F (36.8 C) 98 F (36.7 C) 97.8 F (36.6 C)  TempSrc:  Axillary Axillary Axillary  SpO2:  100% 98% 91%  Weight:      Height:        Intake/Output Summary (Last 24 hours) at 04/29/17 1053 Last data filed at 04/29/17 0700  Gross per 24 hour  Intake           1367.5 ml  Output                0 ml  Net           1367.5 ml   Filed Weights   04/27/17 1700  Weight: 68.8 kg (151 lb 11.2 oz)    Examination:  General exam: sleeping, withdraws to pain Respiratory system: no increase work of breathing, clear anterior Cardiovascular system: irregular Gastrointestinal system: +BS, soft Central nervous system: sleeping so not able to participate in exam Extremities: withdraw to pain Skin: No rashes, lesions or ulcers     Data Reviewed: I have personally reviewed following labs and imaging studies  CBC:  Recent Labs Lab 04/23/17  [id number] 04/27/17 1404 04/27/17 1420 04/29/17 0343  WBC 5.4  --  2.9* 3.5*  NEUTROABS  --   --  1.3*  --   HGB 12.5 13.9 12.6 12.2  HCT 37.4 41.0 39.1 38.6  MCV 90.1  --  90.3 89.8  PLT 150  --  208 214   Basic Metabolic Panel:  Recent Labs Lab 04/23/17 0743 04/27/17 1404 04/27/17 1420 04/29/17 0343  NA 143 140 140 141  K 3.6 5.0 4.5 3.7  CL 107 107 107 104  CO2 27  --  25 26  GLUCOSE 86 89 93 75  BUN 29* 38* 28* 12  CREATININE 1.20* 1.30* 1.38* 0.93  CALCIUM 9.2  --  9.4 9.2   GFR: Estimated Creatinine Clearance: 44.4 mL/min (by C-G formula based on SCr of 0.93 mg/dL). Liver Function Tests:  Recent Labs Lab 04/27/17 1420  AST 35  ALT 16  ALKPHOS 53  BILITOT 0.3  PROT 7.5  ALBUMIN  3.1*   No results for input(s): LIPASE, AMYLASE in the last 168 hours. No results for input(s): AMMONIA in the last 168 hours. Coagulation Profile: No results for input(s): INR, PROTIME in the last 168 hours. Cardiac Enzymes: No results for input(s): CKTOTAL, CKMB, CKMBINDEX, TROPONINI in the last 168 hours. BNP (last 3 results) No results for input(s): PROBNP in the last 8760 hours. HbA1C:  Recent Labs  04/28/17 0313  HGBA1C 5.3   CBG:  Recent Labs Lab 04/27/17 1348  GLUCAP 84   Lipid Profile:  Recent Labs  04/28/17 0313  CHOL 145  HDL 33*  LDLCALC 98  TRIG 70  CHOLHDL 4.4   Thyroid Function Tests: No results for input(s): TSH, T4TOTAL, FREET4, T3FREE, THYROIDAB in the last 72 hours. Anemia Panel: No results for input(s): VITAMINB12, FOLATE, FERRITIN, TIBC, IRON, RETICCTPCT in the last 72 hours. Urine analysis:    Component Value Date/Time   COLORURINE YELLOW 04/19/2017 2047   APPEARANCEUR CLEAR 04/19/2017 2047   LABSPEC 1.011 04/19/2017 2047   PHURINE 5.0 04/19/2017 2047   GLUCOSEU NEGATIVE 04/19/2017 2047   HGBUR MODERATE (A) 04/19/2017 2047   BILIRUBINUR NEGATIVE 04/19/2017 2047   BILIRUBINUR small 07/29/2016 1103   KETONESUR 5 (A) 04/19/2017 2047   PROTEINUR 30 (A) 04/19/2017 2047   UROBILINOGEN 1.0 07/29/2016 1103   UROBILINOGEN 1.0 04/14/2015 1030   NITRITE POSITIVE (A) 04/19/2017 2047   LEUKOCYTESUR TRACE (A) 04/19/2017 2047     ) Recent Results (from the past 240 hour(s))  Culture, Urine     Status: Abnormal   Collection Time: 04/19/17  8:47 PM  Result Value Ref Range Status   Specimen Description URINE, RANDOM  Final   Special Requests NONE  Final   Culture >=100,000 COLONIES/mL KLEBSIELLA PNEUMONIAE (A)  Final   Report Status 04/23/2017 FINAL  Final   Organism ID, Bacteria KLEBSIELLA PNEUMONIAE (A)  Final      Susceptibility   Klebsiella pneumoniae - MIC*    AMPICILLIN >=32 RESISTANT Resistant     CEFAZOLIN <=4 SENSITIVE Sensitive      CEFTRIAXONE <=1 SENSITIVE Sensitive     CIPROFLOXACIN <=0.25 SENSITIVE Sensitive     GENTAMICIN <=1 SENSITIVE Sensitive     IMIPENEM <=0.25 SENSITIVE Sensitive     NITROFURANTOIN 64 INTERMEDIATE Intermediate     TRIMETH/SULFA >=320 RESISTANT Resistant     AMPICILLIN/SULBACTAM 8 SENSITIVE Sensitive     PIP/TAZO <=4 SENSITIVE Sensitive     Extended ESBL NEGATIVE Sensitive     * >=100,000 COLONIES/mL KLEBSIELLA  PNEUMONIAE  Blood culture (routine x 2)     Status: None   Collection Time: 04/19/17  9:45 PM  Result Value Ref Range Status   Specimen Description BLOOD RIGHT WRIST  Final   Special Requests IN PEDIATRIC BOTTLE Blood Culture adequate volume  Final   Culture   Final    NO GROWTH 5 DAYS Performed at [facility], [address] [place], [place] [address]    Report Status 04/25/2017 FINAL  Final  Blood culture (routine x 2)     Status: None   Collection Time: 04/20/17  8:00 AM  Result Value Ref Range Status   Specimen Description BLOOD LEFT ARM  Final   Special Requests IN PEDIATRIC BOTTLE Blood Culture adequate volume  Final   Culture   Final    NO GROWTH 5 DAYS Performed at [facility], [address] [place], [place] [address]    Report Status 04/25/2017 FINAL  Final      Anti-infectives    Start     Dose/Rate Route Frequency Ordered Stop   04/27/17 1745  cefUROXime (CEFTIN) tablet 250 mg    Comments:  Patient taking differently: Has 3 doses left     250 mg Oral 2 times daily with meals 04/27/17 1742 04/29/17 0759       Radiology Studies: Ct Head Wo Contrast  Result Date: 04/28/2017 CLINICAL DATA:  Stroke.  Altered mental status. EXAM: CT HEAD WITHOUT CONTRAST TECHNIQUE: Contiguous axial images were obtained from the base of the skull through the vertex without intravenous contrast. COMPARISON:  CT head without contrast 04/27/2017, 04/19/2017, and 07/17/2015. FINDINGS: Brain: The study is moderately degraded by patient motion. There is less patient  motion than on the prior exam. The remote left parietal infarct is again seen. Diffuse white matter disease is evident bilaterally. The posterior left insular ribbon and is less well seen than on prior exam. No other acute infarct is present. There is no acute hemorrhage or mass lesion. Vascular: L atherosclerotic calcifications are present within the right uterus and left cavernous internal carotid artery and at the dural margin of both vertebral arteries. There is no hyperdense vessel. Skull: The calvarium is intact. No focal lytic or blastic lesions are present. No acute or healing fracture is present. Hyperostosis frontalis internus is noted. Sinuses/Orbits: The paranasal sinuses and mastoid air cells are clear. The globes and orbits are within normal limits. IMPRESSION: 1. Loss of gray-white differentiation along the left posterior insular ribbon is concerning for progressive left MCA territory infarct. 2. More remote left parietal lobe infarct is stable. 3. Diffuse white matter disease likely reflects the sequela of chronic microvascular ischemia. Electronically Signed   By: Marin Roberts M.D.   On: 04/28/2017 14:34   Dg Chest Port 1 View  Result Date: 04/27/2017 CLINICAL DATA:  Altered mental status, Hx of CAD, heart failure EXAM: PORTABLE CHEST 1 VIEW COMPARISON:  Chest x-ray dated 04/14/2015. Chest x-ray dated 04/19/2017. FINDINGS: Cardiomegaly appears stable. Atherosclerotic changes noted at the aortic arch, presumed aortic ectasia. Lungs are clear. No pleural effusion or pneumothorax seen. Degenerative changes noted at the shoulders. No acute or suspicious osseous finding. Stable elevation of the left hemidiaphragm. IMPRESSION: No active disease.  No evidence of pneumonia or pulmonary edema. Stable cardiomegaly. Aortic atherosclerosis. Electronically Signed   By: Bary Richard M.D.   On: 04/27/2017 14:37   Ct Head Code Stroke Wo Contrast  Result Date: 04/27/2017 CLINICAL DATA:  Code stroke.  Right-sided paralysis. EXAM: CT HEAD WITHOUT CONTRAST TECHNIQUE: Contiguous axial images were obtained from the base of the skull through the vertex without intravenous contrast. COMPARISON:  04/19/2017 FINDINGS: Brain: Very motion degraded study, patient reportedly combative. No evidence of acute hemorrhage. There is a remote left parietal infarct that is moderate size. No interval infarct is visible. No hydrocephalus or masslike findings. Vascular: Atherosclerosis.  No evidence of hyperdense vessel. Skull: Hyperostosis.  No acute finding. Sinuses/Orbits: No acute finding Other: Prelim results sent by text page 04/27/2017 at 1:53 pm to Dr. Laurence Slate. ASPECTS [facility] Stroke Program Early CT Score) Not scored given the degree of artifact. IMPRESSION: 1. Combative patient with motion degraded study that could obscure pathology. 2. No acute finding. 3. Moderate remote left parietal infarct. Electronically Signed   By: Marnee Spring M.D.   On: 04/27/2017 13:55        Scheduled Meds: .  stroke: mapping our early stages of recovery book   Does not apply Once  . atorvastatin  80 mg Oral q1800  . donepezil  10 mg Oral QHS  . enoxaparin (LOVENOX) injection  40 mg Subcutaneous Q24H  . metoprolol tartrate  5 mg Intravenous Q6H  . cyanocobalamin  1,000 mcg Oral Daily   Continuous Infusions: . sodium chloride 75 mL/hr at 04/28/17 2353     LOS: 1 day    Time spent: 35 min    Chamaine Stankus U Levaeh Vice, DO [facility] Pager [phone]  If 7PM-7AM, please contact night-coverage [email or web address] Password [email or web address] 04/29/2017, 10:53 AM

## 2017-04-29 NOTE — Progress Notes (Signed)
Ativan administered for agitation and restlessness but patient still attyempting to get out of bed at thsi time. TELE sitter at bedside d/c'ed due pt not able to redirected. MD order 1:1 sitter at bedside at this time.   Sim BoastHavy, RN

## 2017-04-29 NOTE — [place] FL2 (Signed)
[place] MEDICAID FL2 LEVEL OF CARE SCREENING TOOL     IDENTIFICATION  Patient Name: [name] Birthdate: 23-Apr-1933 Sex: female Admission Date (Current Location): 04/27/2017  [facility] [place]   Facility and Address:  [facility], [address], [place], [place] [address]      Provider Number: [id number]  Attending Physician Name and Address:  Joseph ArtVann, Jessica U, DO  Relative Name and Phone Number:       Current Level of Care: Hospital Recommended Level of Care: Skilled Nursing Facility Prior Approval Number:    Date Approved/Denied:   PASRR Number: [id number]  Discharge Plan: SNF    Current Diagnoses: Patient Active Problem List   Diagnosis Date Noted  . Stroke (HCC) 04/27/2017  . Dehydration 04/19/2017  . Acute lower UTI 04/19/2017  . Atrial fibrillation with RVR (HCC) 04/19/2017  . AKI (acute kidney injury) (HCC) 04/19/2017  . Sepsis (HCC) 04/19/2017  . Pure hypercholesterolemia 08/27/2016  . Encounter for therapeutic drug monitoring 01/20/2014  . Dementia with behavioral disturbance 01/01/2014  . Leukopenia 12/30/2013  . Abnormal TSH 12/30/2013  . Hypokalemia 12/30/2013  . Acute encephalopathy 12/29/2013  . Atrial fibrillation with slow ventricular response (HCC) 12/29/2013  . Thrombocytopenia, unspecified (HCC) 12/29/2013  . Bradycardia 12/29/2013  . Long term (current) use of anticoagulants 01/24/2011  . Possible CAD - abnl myoview 2011, managed conservatively 01/17/2011  . PAD (peripheral artery disease) (HCC) 01/17/2011  . Chronic diastolic heart failure (HCC) 07/23/2010  . ANEMIA 06/19/2009  . HTN (hypertension) 06/02/2007    Orientation RESPIRATION BLADDER Height & Weight     Self  O2 (Virgie 2L) Incontinent, External catheter Weight: 151 lb 11.2 oz (68.8 kg) Height:  5\' 4"  (162.6 cm)  BEHAVIORAL SYMPTOMS/MOOD NEUROLOGICAL BOWEL NUTRITION STATUS      Incontinent    AMBULATORY STATUS COMMUNICATION OF  NEEDS Skin   Total Care Does not communicate Normal                       Personal Care Assistance Level of Assistance  Bathing, Feeding, Dressing Bathing Assistance: Maximum assistance Feeding assistance: Maximum assistance Dressing Assistance: Maximum assistance     Functional Limitations Info             SPECIAL CARE FACTORS FREQUENCY  PT (By licensed PT), OT (By licensed OT)     PT Frequency: 5x/wk OT Frequency: 5x/wk            Contractures      Additional Factors Info  Code Status, Allergies Code Status Info: FULL Allergies Info: NKA           Current Medications (04/29/2017):  This is the current hospital active medication list Current Facility-Administered Medications  Medication Dose Route Frequency Provider Last Rate Last Dose  .  stroke: mapping our early stages of recovery book   Does not apply Once Kathlen ModyAkula, Vijaya, MD      . 0.9 %  sodium chloride infusion   Intravenous Continuous Joseph ArtVann, Jessica U, DO 75 mL/hr at 04/28/17 2353    . acetaminophen (TYLENOL) tablet 650 mg  650 mg Oral Q4H PRN Kathlen ModyAkula, Vijaya, MD       Or  . acetaminophen (TYLENOL) solution 650 mg  650 mg Per Tube Q4H PRN Kathlen ModyAkula, Vijaya, MD       Or  . acetaminophen (TYLENOL) suppository 650 mg  650 mg Rectal Q4H PRN Kathlen ModyAkula, Vijaya, MD      .  atorvastatin (LIPITOR) tablet 80 mg  80 mg Oral q1800 Kathlen Mody, MD      . donepezil (ARICEPT) tablet 10 mg  10 mg Oral QHS Kathlen Mody, MD      . metoprolol tartrate (LOPRESSOR) injection 5 mg  5 mg Intravenous Q6H Vann, Jessica U, DO   5 mg at 04/29/17 0651  . vitamin B-12 (CYANOCOBALAMIN) tablet 1,000 mcg  1,000 mcg Oral Daily Kathlen Mody, MD         Discharge Medications: Please see discharge summary for a list of discharge medications.  Relevant Imaging Results:  Relevant Lab Results:   Additional Information SS#: [id number]  Baldemar Lenis, LCSW

## 2017-04-30 DIAGNOSIS — Z7189 Other specified counseling: Secondary | ICD-10-CM

## 2017-04-30 MED ORDER — LORAZEPAM 2 MG/ML IJ SOLN
0.2500 mg | Freq: Once | INTRAMUSCULAR | Status: AC
  Administered 2017-04-30: 0.25 mg via INTRAVENOUS
  Filled 2017-04-30: qty 1

## 2017-04-30 MED ORDER — METOPROLOL TARTRATE 12.5 MG HALF TABLET
12.5000 mg | ORAL_TABLET | Freq: Two times a day (BID) | ORAL | Status: DC
  Filled 2017-04-30: qty 1

## 2017-04-30 MED ORDER — RIVAROXABAN 20 MG PO TABS
20.0000 mg | ORAL_TABLET | Freq: Every day | ORAL | Status: DC
  Administered 2017-04-30: 20 mg via ORAL
  Filled 2017-04-30: qty 1

## 2017-04-30 MED ORDER — HALOPERIDOL LACTATE 5 MG/ML IJ SOLN
1.0000 mg | Freq: Four times a day (QID) | INTRAMUSCULAR | Status: DC | PRN
  Administered 2017-04-30 – 2017-05-01 (×2): 1 mg via INTRAMUSCULAR
  Filled 2017-04-30 (×2): qty 1

## 2017-04-30 MED ORDER — HALOPERIDOL LACTATE 5 MG/ML IJ SOLN: 1.0000 mg | Freq: Four times a day (QID) | INTRAMUSCULAR | Status: DC | PRN

## 2017-04-30 MED ORDER — AMLODIPINE BESYLATE 10 MG PO TABS
10.0000 mg | ORAL_TABLET | Freq: Every day | ORAL | Status: DC
  Administered 2017-05-01: 10 mg via ORAL
  Filled 2017-04-30 (×2): qty 1

## 2017-04-30 NOTE — Progress Notes (Signed)
No charge note.  PMT meeting today at 4:00 pm  Norvel RichardsMarianne Cletus [place], [place]-C Palliative Medicine Pager: [phone]

## 2017-04-30 NOTE — Progress Notes (Signed)
PROGRESS NOTE    [name]  ZOX:[id number] DOB: 08-Jun-1933 DOA: 04/27/2017 PCP: System, Provider Not In   Outpatient Specialists:     Brief Narrative:  [name] is a 83 y.o. female with medical history significant of hypertension. Chronic diastolic heart failure, atrial fibrillation, recent admission for UTI and sepsis, was discharged on 8/1 , sent back from SNF for altered mental status, . History is minimal as she is somnolent, would respond to verbal cues but wouldn ot give me any history sec to dementia.  She was seen normal around 12 and was leaning to the right. She was sent to ED for evaluation of stroke.  Most of the history is from EDP and snf notes.    Assessment & Plan:   Active Problems:   Acute encephalopathy   Atrial fibrillation with RVR (HCC)   AKI (acute kidney injury) (HCC)   Stroke [facility])   Right sided weakness   Somnolence   Palliative care encounter   CVA Initial CT head negative for acute pathology- unable to do MRI due to patient not being able to cooperate but repeat CT  shows: Loss of gray-white differentiation along the left posterior insular ribbon is concerning for progressive left MCA territory infarct. Recent echo, so will not replete.  Unable to get carotid duplex as patient would not cooperate  LDL: 98-- add statin HgbA1C: 5.3 Neurology consulted  -EEG not consistent with seizures -palliative care consult for GOC- meeting today  Dementia- severe:  Resume aricept  Hypertension: permissive hypertension.   afib with RVR:  On xarelto for anticoagulation, which is being held as patient NPO -IV BB while NPO  Elevated troponin:  -decreased from last admission  Acute kidney injury:  Hold ace inhibitor  Resolved with hydration  abnormal TSH/free t4 -outpatient follow up   DVT prophylaxis:  SCD's  Code Status: Full Code   Family Communication: Able to update daughter at [phone]-  Disposition Plan:  Poor overall  outcome: comfort care--? Back to SNF   Consultants:   Neuro  Palliative care    Subjective: Much more awake and talkative but not making sense  Objective: Vitals:   04/29/17 1653 04/29/17 2050 04/30/17 0030 04/30/17 1002  BP: (!) 148/90 140/87 124/83 124/76  Pulse: 91 91 (!) 106 (!) 115  Resp: 18 20 20 20   Temp: 98.2 F (36.8 C) 98.2 F (36.8 C) 97.6 F (36.4 C) 97.9 F (36.6 C)  TempSrc: Axillary Axillary Axillary Axillary  SpO2:  100% 91% 95%  Weight:      Height:       No intake or output data in the 24 hours ending 04/30/17 1306 Filed Weights   04/27/17 1700  Weight: 68.8 kg (151 lb 11.2 oz)    Examination:  General exam: awake, laying crooked in bed Respiratory system: no increase work of breathing, clear anterior Cardiovascular system: irregular Gastrointestinal system: +BS, soft Central nervous system: unable to understand patient's speech Skin: No rashes, lesions or ulcers- skin dry     Data Reviewed: I have personally reviewed following labs and imaging studies  CBC:  Recent Labs Lab 04/27/17 1404 04/27/17 1420 04/29/17 0343  WBC  --  2.9* 3.5*  NEUTROABS  --  1.3*  --   HGB 13.9 12.6 12.2  HCT 41.0 39.1 38.6  MCV  --  90.3 89.8  PLT  --  208 214   Basic Metabolic Panel:  Recent Labs Lab 04/27/17 1404 04/27/17 1420 04/29/17 0343  NA  140 140 141  K 5.0 4.5 3.7  CL 107 107 104  CO2  --  25 26  GLUCOSE 89 93 75  BUN 38* 28* 12  CREATININE 1.30* 1.38* 0.93  CALCIUM  --  9.4 9.2   GFR: Estimated Creatinine Clearance: 44.4 mL/min (by C-G formula based on SCr of 0.93 mg/dL). Liver Function Tests:  Recent Labs Lab 04/27/17 1420  AST 35  ALT 16  ALKPHOS 53  BILITOT 0.3  PROT 7.5  ALBUMIN 3.1*   No results for input(s): LIPASE, AMYLASE in the last 168 hours. No results for input(s): AMMONIA in the last 168 hours. Coagulation Profile: No results for input(s): INR, PROTIME in the last 168 hours. Cardiac Enzymes: No  results for input(s): CKTOTAL, CKMB, CKMBINDEX, TROPONINI in the last 168 hours. BNP (last 3 results) No results for input(s): PROBNP in the last 8760 hours. HbA1C:  Recent Labs  04/28/17 0313  HGBA1C 5.3   CBG:  Recent Labs Lab 04/27/17 1348  GLUCAP 84   Lipid Profile:  Recent Labs  04/28/17 0313  CHOL 145  HDL 33*  LDLCALC 98  TRIG 70  CHOLHDL 4.4   Thyroid Function Tests: No results for input(s): TSH, T4TOTAL, FREET4, T3FREE, THYROIDAB in the last 72 hours. Anemia Panel: No results for input(s): VITAMINB12, FOLATE, FERRITIN, TIBC, IRON, RETICCTPCT in the last 72 hours. Urine analysis:    Component Value Date/Time   COLORURINE YELLOW 04/19/2017 2047   APPEARANCEUR CLEAR 04/19/2017 2047   LABSPEC 1.011 04/19/2017 2047   PHURINE 5.0 04/19/2017 2047   GLUCOSEU NEGATIVE 04/19/2017 2047   HGBUR MODERATE (A) 04/19/2017 2047   BILIRUBINUR NEGATIVE 04/19/2017 2047   BILIRUBINUR small 07/29/2016 1103   KETONESUR 5 (A) 04/19/2017 2047   PROTEINUR 30 (A) 04/19/2017 2047   UROBILINOGEN 1.0 07/29/2016 1103   UROBILINOGEN 1.0 04/14/2015 1030   NITRITE POSITIVE (A) 04/19/2017 2047   LEUKOCYTESUR TRACE (A) 04/19/2017 2047      No results found for this or any previous visit (from the past 240 hour(s)).    Anti-infectives    Start     Dose/Rate Route Frequency Ordered Stop   04/27/17 1745  cefUROXime (CEFTIN) tablet 250 mg    Comments:  Patient taking differently: Has 3 doses left     250 mg Oral 2 times daily with meals 04/27/17 1742 04/29/17 0759       Radiology Studies: Ct Head Wo Contrast  Result Date: 04/28/2017 CLINICAL DATA:  Stroke.  Altered mental status. EXAM: CT HEAD WITHOUT CONTRAST TECHNIQUE: Contiguous axial images were obtained from the base of the skull through the vertex without intravenous contrast. COMPARISON:  CT head without contrast 04/27/2017, 04/19/2017, and 07/17/2015. FINDINGS: Brain: The study is moderately degraded by patient motion.  There is less patient motion than on the prior exam. The remote left parietal infarct is again seen. Diffuse white matter disease is evident bilaterally. The posterior left insular ribbon and is less well seen than on prior exam. No other acute infarct is present. There is no acute hemorrhage or mass lesion. Vascular: L atherosclerotic calcifications are present within the right uterus and left cavernous internal carotid artery and at the dural margin of both vertebral arteries. There is no hyperdense vessel. Skull: The calvarium is intact. No focal lytic or blastic lesions are present. No acute or healing fracture is present. Hyperostosis frontalis internus is noted. Sinuses/Orbits: The paranasal sinuses and mastoid air cells are clear. The globes and orbits are within normal limits.  IMPRESSION: 1. Loss of gray-white differentiation along the left posterior insular ribbon is concerning for progressive left MCA territory infarct. 2. More remote left parietal lobe infarct is stable. 3. Diffuse white matter disease likely reflects the sequela of chronic microvascular ischemia. Electronically Signed   By: Marin Robertshristopher  Mattern M.D.   On: 04/28/2017 14:34        Scheduled Meds: .  stroke: mapping our early stages of recovery book   Does not apply Once  . atorvastatin  80 mg Oral q1800  . donepezil  10 mg Oral QHS  . enoxaparin (LOVENOX) injection  40 mg Subcutaneous Q24H  . metoprolol tartrate  5 mg Intravenous Q6H  . cyanocobalamin  1,000 mcg Oral Daily   Continuous Infusions: . sodium chloride 75 mL/hr at 04/30/17 0647     LOS: 2 days    Time spent: 35 min    Ridley Schewe U Ramesh Moan, DO [facility] Pager [phone]  If 7PM-7AM, please contact night-coverage [email or web address] Password [email or web address] 04/30/2017, 1:06 PM

## 2017-04-30 NOTE — Progress Notes (Signed)
STROKE TEAM PROGRESS NOTE   HISTORY OF PRESENT ILLNESS (per record)  [name] is an 83 y.o. female history of advanced dementia, coronary artery disease, atrial fibrillation, prior stroke was brought in as a stroke alert after her nurse found her to have a facial droop and leaning to right side at 12:55 PM. She takes Xarelto and last dose was 4:35 PM. At baseline in the nursing home, she cannot have a conversation but can intermittently speak some words. She she can only follow simple commands, needs assistance to eat and use bathroom, and can walk with someone helping her.   [facility]. She was aphasic, with right arm weakness and right facial droop. He CT head was very limited due to motion artifact but was negative for hemorrhage.  Not IA candidate due to poor baseline MRS.  Date last known well: 04/27/17 Time last known well: 12.55 pm NIHSS: 21 Modified Rankin: 4  Patient was not administered IV t-PA secondary to being on oral anticoagulation. She was admitted to General Neurology for further evaluation and treatment.   SUBJECTIVE (INTERVAL HISTORY) She is more alert today and vinces in pain when I tested her and spoke short sentences but is not appropriate with context and has no insight into her condition OBJECTIVE Temp:  [97.6 F (36.4 C)-98.2 F (36.8 C)] 97.9 F (36.6 C) (08/08 1746) Pulse Rate:  [91-124] 110 (08/08 1746) Cardiac Rhythm: Atrial fibrillation (08/08 0558) Resp:  [20] 20 (08/08 1746) BP: (124-143)/(76-87) 143/82 (08/08 1746) SpO2:  [91 %-100 %] 97 % (08/08 1746)  CBC:   Recent Labs Lab 04/27/17 1420 04/29/17 0343  WBC 2.9* 3.5*  NEUTROABS 1.3*  --   HGB 12.6 12.2  HCT 39.1 38.6  MCV 90.3 89.8  PLT 208 214    Basic Metabolic Panel:   Recent Labs Lab 04/27/17 1420 04/29/17 0343  NA 140 141  K 4.5 3.7  CL 107 104  CO2 25 26  GLUCOSE 93 75  BUN 28* 12  CREATININE 1.38* 0.93  CALCIUM 9.4 9.2    Lipid  Panel:     Component Value Date/Time   CHOL 145 04/28/2017 0313   TRIG 70 04/28/2017 0313   HDL 33 (L) 04/28/2017 0313   CHOLHDL 4.4 04/28/2017 0313   VLDL 14 04/28/2017 0313   LDLCALC 98 04/28/2017 0313   HgbA1c:  Lab Results  Component Value Date   HGBA1C 5.3 04/28/2017   Urine Drug Screen:     Component Value Date/Time   LABOPIA NONE DETECTED 12/07/2013 1658   COCAINSCRNUR NONE DETECTED 12/07/2013 1658   LABBENZ NONE DETECTED 12/07/2013 1658   AMPHETMU NONE DETECTED 12/07/2013 1658   THCU NONE DETECTED 12/07/2013 1658   LABBARB NONE DETECTED 12/07/2013 1658    Alcohol Level     Component Value Date/Time   ETH <11 12/07/2013 1705    IMAGING  Ct Head Wo Contrast 04/28/2017 IMPRESSION: 1. Loss of gray-white differentiation along the left posterior insular ribbon is concerning for progressive left MCA territory infarct. 2. More remote left parietal lobe infarct is stable. 3. Diffuse white matter disease likely reflects the sequela of chronic microvascular ischemia.   Dg Chest Port 1 View 04/27/2017 IMPRESSION: No active disease.  No evidence of pneumonia or pulmonary edema. Stable cardiomegaly. Aortic atherosclerosis.  Ct Head Code Stroke Wo Contrast 04/27/2017 IMPRESSION: 1. Combative patient with motion degraded study that could obscure pathology. 2. No acute finding. 3. Moderate remote left  parietal infarct.  TTE 04/28/2017  Left ventricle: The cavity size was normal. Wall thickness was   increased in a pattern of moderate LVH. Systolic function was   normal. The estimated ejection fraction was in the range of 55%   to 60%. Wall motion was normal; there were no regional wall   motion abnormalities. The study is not technically sufficient to   allow evaluation of LV diastolic function. Carotid US 04/28/2017  pending  EEG 04/28/2017 focal area of cortical dysfunction in the left posterior quadrant in the setting of a more generalized mild nonspecific cerebral  dysfunction (encephalopathy). There was no seizure or seizure predisposition recorded on this study.     PHYSICAL EXAM Frail cachectic elderly African-American lady not in distress. . Afebrile. Head is nontraumatic. Neck is supple without bruit.    Cardiac exam no murmur or gallop. Lungs are clear to auscultation. Distal pulses are well felt.  Neurological Exam :   Patient is awake alert. She can follow gaze but does not follow commands. She does  speak few words.  She blinks to threat on either side. Pupils irregular reactive. Fundi could not be visualized. She does not follow any commands. She withdraws left side more than right side to noxious stimuli. She is not cooperative for detailed motor testing. Deep tendon reflexes are depressed on the right compared to the left. Both plantars are upgoing. Gait was not tested. ASSESSMENT/PLAN [name] is a 83 y.o. female with history of advanced dementia, coronary artery disease, atrial fibrillation, prior stroke was brought in as a stroke alert after her nurse found her to have a facial droop and leaning to right side at 12:55 PM. She did not receive IV t-PA due to being on oral anticoagulation.   Stroke vs. Seizure: Loss of gray-white differentiation along the left posterior insular ribbon concerning for progressive left MCA territory infarct in setting advanced small vessel disease and atrial fibrillation on Xarelto.  Resultant   right hemiparesis and loss of speech  CT head: Loss of gray-white differentiation along the left posterior insular ribbon is concerning for progressive left MCA territory infarct.   MRI head  unable to obtain due to patient dementia/non-cooperation  MRA head  not ordered  Carotid Doppler   pending 2D Echo   Not cooperative  LDL 98  HgbA1c 5.4  SCDs for VTE prophylaxis DIET - DYS 1 Room service appropriate? Yes; Fluid consistency: Thin  Xarelto (rivaroxaban) daily prior to admission, now on No  antithrombotic  Patient counseled to be compliant with her antithrombotic medications  Ongoing aggressive stroke risk factor management  Therapy recommendations:   pending  Disposition:   pending  Hypertension  Stable Permissive hypertension (OK if < 220/120) but gradually normalize in 5-7 days Long-term BP goal normotensive  Hyperlipidemia  Home meds: atorvastatin 80mg  PO daily, reordered in hospital  LDL 98, goal < 70  Continue statin at discharge  Other Stroke Risk Factors  Advanced age  Hx stroke/TIA  Coronary artery disease  Other Active Problems  Dementia: continue home donepazil  UTI: UA with + bacteria and nitrites on 7/28, treating with cefuroxime  Hospital day # 2  I have personally examined this patient, reviewed notes, independently viewed imaging studies, participated in medical decision making and plan of care.ROS completed by me personally and pertinent positives fully documented  I have made any additions or clarifications directly to the above note. Patient has significant residual dementia and right hemiparesis and  worsening of  her symptoms possibly from new left brain infarct versus aggravation of old deficits in the setting of bladder infection. Patient has not been cooperative for tests repeat CT scan shows left periinsular embolic infarct likely from her atrial fibrillation.   EEG  Shows focal slowing in the left hemisphere but without definite evidence of seizures   No family available at the bedside for discussion. Discussed with Dr. Benjamine Mola. She will likely need a feeding tube and nursing home placement. Family needs to seriously consider comfort care and palliative care which may be more appropriate for her  Palliative care team is working with family to establish goals of care. Stroke team will sign off. Call for questions. Delia Heady, MD Medical Director [facility] Pager: [phone] 04/30/2017 6:50 PM   To contact Stroke  Continuity provider, please refer to [email or web address]. After hours, contact General Neurology

## 2017-04-30 NOTE — Progress Notes (Signed)
  Speech Language Pathology Treatment: Dysphagia  Patient Details Name: [name] [place] MRN: [id number] DOB: 1932-10-28 Today's Date: 04/30/2017 Time: [id number] SLP Time Calculation (min) (ACUTE ONLY): 14 min  Assessment / Plan / Recommendation Clinical Impression  SLP was notified by MD that pt was alert and more appropriate this morning, following some commands. SLP offered a variety of POs but pt still displays poor awareness and initiation for intake. She did consume one spoonful of water with no obvious signs of aspiration, but she did not take in any other trials despite Max cueing from SLP. Would recommend remaining NPO pending palliative care meeting with family this afternoon. At that time, should they opt for comfort feedings, would suggest not initiating more than Dys 1 textures and thin liquids. I continue to suspect that her intake would be minimal even with diet initiation, but this would allow her to take in some POs for comfort if desired.    HPI HPI: 83 y.o. female admitted on 04/27/17 for AMS.  Dx with acute encephalopathy of unclear etiology r/o stroke.  MRI pending (attempted on ativan, but pt moving too much), CT was negative.  Pt with significant PMH of PAD, memory loss, HTN, diastolic HF, chronic A-fib, CVA, and CAD.      SLP Plan  Continue with current plan of care       Recommendations  Diet recommendations: NPO;Other(comment) (Dys 1 thin liquids if pursuing comfort feeding) Medication Administration: Via alternative means                Oral Care Recommendations: Oral care QID Follow up Recommendations: Skilled Nursing facility SLP Visit Diagnosis: Dysphagia, unspecified (R13.10) Plan: Continue with current plan of care       GO                Maxcine Hamaiewonsky, Rebbecca Osuna 04/30/2017, 10:15 AM  Maxcine HamLaura Paiewonsky, M.A. CCC-SLP [phone]

## 2017-04-30 NOTE — Progress Notes (Signed)
Patient very combative with staff members,  Hitting, scratching, kicking and spitting at staff members.  Trying to get out of bed.  Removed teley.  RN notified provider

## 2017-04-30 NOTE — Progress Notes (Signed)
Daily Progress Note   Patient Name: [name]       Date: 04/30/2017 DOB: 10-28-1932  Age: 83 y.o. MRN#: [id number] Attending Physician: Geradine Girt, DO Primary Care Physician: System, Provider Not In Admit Date: 04/27/2017  Reason for Consultation/Follow-up: Establishing goals of care  Subjective: The patient is not very verbal. She can communicate some but usually is out of context. She has asked for help getting clean this morning.  She was non combative and peaceful.   We met with her daughter and nephew at 4:00 pm.  Her nephew Olie Scaffidi is the primary decision maker.  We explained her current condition.  They accepted the risk of aspiration and want to try careful hand feeding this evening while they speak with their family about next steps.  We explained that if she does not eat she is [facility] eligible.   We did discuss potential aspiration pneumonia.  The family accepts this risk.  We also discussed feeding tubes and explained that they are not medically recommended in cases of advanced dementia (they hasten death).   The family received this information well.  The family requested that we (The medical team) recommends what is best for their mother (either Hospice House or return to SNF) based on whether or not she is able to take in nutrition in the next 24 hours. The family appreciated the information provided and will most likely accept the medical team's recommendation.   Assessment: 83 yo female with underlying dementia and previous stroke.  She now has a new progressive Left MCA infarct causing speech deficits, right sided hemiparesis and an inability to swallow safely.   Patient Profile/HPI: 83 y.o. female  with past medical history of HTN, prior stroke, Diastolic  HF (EF of 82%), atrial fibrillation, dementia and a recent discharge on 8/1 after UTI with sepsis who was admitted on 04/27/2017 with altered mental status. Her recent Echo shoes Pulmonary artery pressure of 53 mmHG (moderate pulmonary HTN), EF of 55% and LVH.  She has been agitated and it has been difficult to assess her with imaging. CT shows "loss of grey-white matter along left posterior insular ribbon concerning for progressive left MSA territory infarct."  Neurology is concerned for a left MCA stroke but reports that she is not a candidate for any intervention.  Speech reports that she has poor awareness of food bolus and they were unable to test multiple foods. They have noted a decline from the last swallow study.  Her albumin is 3.1. According to nursing staff she sleeps all day and is awake and agitated at night.    Length of Stay: 2  Current Medications: Scheduled Meds:  .  stroke: mapping our early stages of recovery book   Does not apply Once  . amLODipine  10 mg Oral Daily  . atorvastatin  80 mg Oral q1800  . donepezil  10 mg Oral QHS  . metoprolol tartrate  12.5 mg Oral BID  . rivaroxaban  20 mg Oral Q supper  . cyanocobalamin  1,000 mcg Oral Daily    Continuous Infusions:   PRN Meds: acetaminophen **OR** acetaminophen (TYLENOL) oral liquid 160 mg/5 mL **OR** acetaminophen, haloperidol lactate  Physical Exam     Patient is awake and lying peacefully in bed. She was not combative. Appears to be thin and frail.  Skin: Right upper lip has an abrasion. Mucous membranes appear moist.   CV: irregularly irregular heart rate, tachycardic, no murmur appreciated. No peripheral edema  Pulm: shallow breaths, clear bilaterally  ABD: active bowel sounds.  Neuro: decreased grip strength. Able to follow some instructions. Significant right side neglect and right facial droop.   Vital Signs: BP (!) 143/82 (BP Location: Left Arm)   Pulse (!) 110   Temp 97.9 F (36.6 C) (Axillary)   Resp  20   Ht _0  (1.626 m)   Wt 68.8 kg (151 lb 11.2 oz)   SpO2 97%   BMI 26.04 kg/m  SpO2: SpO2: 97 % O2 Device: O2 Device: Not Delivered O2 Flow Rate: O2 Flow Rate (L/min): 3 L/min  Intake/output summary:   Intake/Output Summary (Last 24 hours) at 04/30/17 1814 Last data filed at 04/30/17 1300  Gross per 24 hour  Intake               60 ml  Output                0 ml  Net               60 ml   LBM: Last BM Date:  (pt unable to report) Baseline Weight: Weight: 68.8 kg (151 lb 11.2 oz) Most recent weight: Weight: 68.8 kg (151 lb 11.2 oz)       Palliative Assessment/Data: 10%    Flowsheet Rows     Most Recent Value  Intake Tab  Referral Department  Hospitalist  Unit at Time of Referral  Cardiac/Telemetry Unit  Palliative Care Primary Diagnosis  Neurology [Stroke, advancing dementia]  Date Notified  04/28/17  Palliative Care Type  New Palliative care  Reason for referral  Clarify Goals of Care  Date of Admission  04/27/17  Date first seen by Palliative Care  04/29/17  # of days Palliative referral response time  1 Day(s)  # of days IP prior to Palliative referral  1  Clinical Assessment  Palliative Performance Scale Score  20%  Psychosocial & Spiritual Assessment  Palliative Care Outcomes  Patient/Family meeting held?  Yes  Who was at the meeting?  patient, dtr, nephew  Palliative Care Outcomes  Clarified goals of care, Counseled regarding hospice, Provided psychosocial or spiritual support      Patient Active Problem List   Diagnosis Date Noted  . Right sided weakness   . Somnolence   . Palliative care encounter   .  Stroke ([place]) 04/27/2017  . Dehydration 04/19/2017  . Acute lower UTI 04/19/2017  . Atrial fibrillation with RVR ([place]) 04/19/2017  . AKI (acute kidney injury) ([place]) 04/19/2017  . Sepsis ([place]) 04/19/2017  . Pure hypercholesterolemia 08/27/2016  . Encounter for therapeutic drug monitoring 01/20/2014  . Dementia with behavioral disturbance  01/01/2014  . Leukopenia 12/30/2013  . Abnormal TSH 12/30/2013  . Hypokalemia 12/30/2013  . Acute encephalopathy 12/29/2013  . Atrial fibrillation with slow ventricular response ([place]) 12/29/2013  . Thrombocytopenia, unspecified ([place]) 12/29/2013  . Bradycardia 12/29/2013  . Long term (current) use of anticoagulants 01/24/2011  . Possible CAD - abnl myoview 2011, managed conservatively 01/17/2011  . PAD (peripheral artery disease) ([place]) 01/17/2011  . Chronic diastolic heart failure ([place]) 07/23/2010  . ANEMIA 06/19/2009  . HTN (hypertension) 06/02/2007    Palliative Care Plan    Recommendations/Plan:  Stop aricept at discharge, minimize medications   Comfort feeds, with careful hand feeding.  Family accepts the risk of aspiration  Reassess on 8/9.  If patient is eating will likely recommend return to SNF.  If she is not eating will likely recommend [facility].  Goals of Care and Additional Recommendations:  Limitations on Scope of Treatment: Minimize Medications  Code Status:  Full code   Prognosis:   TBD  Discharge Planning: To Be Determined   Care plan was discussed with family  Thank you for allowing the Palliative Medicine Team to assist in the care of this patient.  Total time spent:  50 min.     Greater than 50%  of this time was spent counseling and coordinating care related to the above assessment and plan.  Madison Tedrow PA-S  Florentina Jenny, PA-C Palliative Medicine  Please contact Palliative MedicineTeam phone at [phone] for questions and concerns between 7 am - 7 pm.   Please see AMION for individual provider pager numbers.

## 2017-04-30 NOTE — Progress Notes (Signed)
Physical Therapy Treatment Patient Details Name: [name] MRN: [id number] DOB: [birth year] Today's Date: 04/30/2017    History of Present Illness 83 y.o. female admitted on 04/27/17 for AMS.  Dx with acute encephalopathy of unclear etiology r/o stroke.  MRI pending (attempted on ativan, but pt moving too much), CT was negative.  Pt with significant PMH of PAD, memory loss, HTN, diastolic HF, chronic A-fib, CVA, and CAD.  Per neuro note 04/30/17: Stroke vs. Seizure: Loss of gray-white differentiation along the left posterior insular ribbon concerning for progressive left MCA territory infarct in setting advanced small vessel disease and atrial fibrillation on Xarelto.    PT Comments    Today, with two person min to mod hand held assist, we were able to walk with [name] down the hallway to the RN station.  She needed most help for balance despite functionally observed weakness of her right side.  She was cooperative (likely because she has been trying to get up all AM), but I did not feel she was safe enough to be up in the recliner chair even with a chair alarm, so I positioned the bed in chair position to get her more upright time.  I was unable to get an accurate O2 reading on her, so I returned O2 [place] to her nose (she had it on the top of her head when I entered the room earlier).  PT will continue to follow acutely to progress safe mobility.  Follow Up Recommendations  SNF     Equipment Recommendations  None recommended by PT    Recommendations for Other Services   NA     Precautions / Restrictions Precautions Precautions: Fall Precaution Comments: right sided weakness Restrictions Weight Bearing Restrictions: No    Mobility  Bed Mobility Overal bed mobility: Needs Assistance Bed Mobility: Supine to Sit;Sit to Supine     Supine to sit: HOB elevated;Mod assist Sit to supine: Max assist   General bed mobility comments: Mod hand held assist to get to EOB.  Motion initiated by  PT, but pt followed and did not resist.  Per RN she has been trying to get up OOB all AM, so she is likely more cooperative because we are assisting her with getting up.  Max assist needed at trunk and legs likely because pt is resistant to getting back in bed, but I did not feel safe with her in the chair.    Transfers Overall transfer level: Needs assistance Equipment used: 2 person hand held assist Transfers: Sit to/from Stand Sit to Stand: Min assist         General transfer comment: Min bil hand held assist to stand EOB.  Attempted to use RW, but pt did not take to it (likely a novel task and with dementia and cognitive deficits, it would be hard to introduce something new).    Ambulation/Gait Ambulation/Gait assistance: Mod assist;+2 physical assistance;Min assist Ambulation Distance (Feet): 100 Feet Assistive device: 2 person hand held assist Gait Pattern/deviations: Step-through pattern;Staggering right;Staggering left;Leaning posteriorly Gait velocity: decreased Gait velocity interpretation: <1.8 ft/sec, indicative of risk for recurrent falls General Gait Details: Pt needed up to mod, but at times min bil hand held assist for gait into the hallway, despite observed weakness on her right side, she did not buckle or drag her right foot considerably during gait.  She mostly needed assist for balance due to staggering gait.         Modified Rankin (Stroke Patients Only) Modified Rankin (  Stroke Patients Only) Pre-Morbid Rankin Score: Moderately severe disability Modified Rankin: Moderately severe disability     Balance Overall balance assessment: Needs assistance Sitting-balance support: Feet supported;Bilateral upper extremity supported Sitting balance-Leahy Scale: Fair Sitting balance - Comments: close supervision EOB.    Standing balance support: Bilateral upper extremity supported Standing balance-Leahy Scale: Poor Standing balance comment: needs min to mod hand held  assist in standing.                             Cognition Arousal/Alertness: Awake/alert Behavior During Therapy: Restless Overall Cognitive Status: No family/caregiver present to determine baseline cognitive functioning Area of Impairment: Attention;Awareness;Safety/judgement;Problem solving                   Current Attention Level: Focused     Safety/Judgement: Decreased awareness of safety;Decreased awareness of deficits Awareness: Intellectual Problem Solving: Slow processing;Decreased initiation;Difficulty sequencing;Requires verbal cues;Requires tactile cues General Comments: Pt with baseline dementia and likely some additional cognitive deficits from possible stroke.               Pertinent Vitals/Pain Pain Assessment: Faces Faces Pain Scale: No hurt           PT Goals (current goals can now be found in the care plan section) Acute Rehab PT Goals Patient Stated Goal: unable to state asking for her son today. Progress towards PT goals: Progressing toward goals    Frequency    Min 3X/week      PT Plan Current plan remains appropriate       AM-PAC PT "6 Clicks" Daily Activity  Outcome Measure  Difficulty turning over in bed (including adjusting bedclothes, sheets and blankets)?: Total Difficulty moving from lying on back to sitting on the side of the bed? : Total Difficulty sitting down on and standing up from a chair with arms (e.g., wheelchair, bedside commode, etc,.)?: Total Help needed moving to and from a bed to chair (including a wheelchair)?: A Lot Help needed walking in hospital room?: A Lot Help needed climbing 3-5 steps with a railing? : Total 6 Click Score: 8    End of Session Equipment Utilized During Treatment: Gait belt Activity Tolerance: Patient limited by fatigue Patient left: in bed;with call bell/phone within reach;with bed alarm set Nurse Communication: Mobility status PT Visit Diagnosis: Difficulty in walking,  not elsewhere classified (R26.2)     Time: 1610-9604 PT Time Calculation (min) (ACUTE ONLY): 24 min  Charges:  $Gait Training: 23-37 mins   Tremond Shimabukuro B. Kelena Garrow, PT, DPT [phone]            04/30/2017, 11:56 AM

## 2017-05-01 DIAGNOSIS — F0281 Dementia in other diseases classified elsewhere with behavioral disturbance: Secondary | ICD-10-CM

## 2017-05-01 DIAGNOSIS — G20 Parkinson's disease: Secondary | ICD-10-CM

## 2017-05-01 MED ORDER — RIVAROXABAN 15 MG PO TABS: 15.0000 mg | ORAL_TABLET | Freq: Every day | ORAL | Status: DC

## 2017-05-01 MED ORDER — HALOPERIDOL LACTATE 2 MG/ML PO CONC
2.0000 mg | Freq: Four times a day (QID) | ORAL | Status: DC | PRN
  Filled 2017-05-01: qty 1

## 2017-05-01 MED ORDER — HALOPERIDOL LACTATE 2 MG/ML PO CONC: 2.0000 mg | Freq: Four times a day (QID) | ORAL | 0 refills | Status: AC | PRN

## 2017-05-01 NOTE — Progress Notes (Addendum)
Pt d/c to [facility]. Pt will be transported out of facility by [facility], RN attempted report 1x  Nurse called back, [facility]

## 2017-05-01 NOTE — Clinical Social Work Note (Signed)
Clinical Social Work Assessment  Patient Details  Name: [name] MRN: [id number] Date of Birth: 02/18/1933  Date of referral:  05/01/17               Reason for consult:  Facility Placement, Discharge Planning                Permission sought to share information with:  Facility Medical sales representativeContact Representative, Family Supports Permission granted to share information::  Yes, Verbal Permission Granted  Name::     Bonita QuinLinda, Holiday representativeJunior, MidwifeAnthony  [facility]  Relationship::  Children, Architectephew  Contact Information:     Housing/Transportation Living arrangements for the past 2 months:  Skilled Building surveyorursing Facility Source of Information:  Adult Children, Palliative Care Team Patient Interpreter Needed:  None Criminal Activity/Legal Involvement Pertinent to Current Situation/Hospitalization:  No - Comment as needed Significant Relationships:  Adult Children, Other Family Members Lives with:  Self, Facility Resident Do you feel safe going back to the place where you live?  Yes Need for family participation in patient care:  Yes (Comment) (patient not oriented)  Care giving concerns:  [facility] and family has no concerns about care received there.   Social Worker assessment / plan:  CSW spoke to patient's son, Holiday representativeJunior, on the phone to confirm that [facility]. Patient's son discussed that he wanted the patient to be where she was comfortable and at home. CSW to arrange for transport and [facility].  Employment status:  Retired Health and safety inspectornsurance information:  Medicare PT Recommendations:  Skilled Nursing Facility Information / Referral to community resources:  Skilled Nursing Facility  Patient/Family's Response to care:  Patient's son agreeable to return to SNF.  Patient/Family's Understanding of and Emotional Response to Diagnosis, Current Treatment, and Prognosis:  Patient's son seemed to  be aware of the patient's poor prognosis, but indicated that he wanted the patient to be where she was already comfortable instead of transitioning her to somewhere new. Patient's son was appreciative of the help.  Emotional Assessment Appearance:  Appears stated age Attitude/Demeanor/Rapport:  Unable to Assess Affect (typically observed):  Unable to Assess Orientation:  Oriented to Self Alcohol / Substance use:  Not Applicable Psych involvement (Current and /or in the community):  No (Comment)  Discharge Needs  Concerns to be addressed:  Care Coordination, Discharge Planning Concerns Readmission within the last 30 days:    Current discharge risk:  Physical Impairment, Cognitively Impaired, Terminally ill Barriers to Discharge:  No Barriers Identified   Baldemar Lenislizabeth M Yonael Tulloch, LCSW 05/01/2017, 4:04 PM

## 2017-05-01 NOTE — Care Management Note (Signed)
Case Management Note  Patient Details  Name: [name] MRN: [id number] Date of Birth: [birth year]  Subjective/Objective:                    Action/Plan: [facility] No further needs per CM.   Expected Discharge Date:  05/01/17               Expected Discharge Plan:  Skilled Nursing Facility  In-House Referral:  Clinical Social Work  Discharge planning Services     Post Acute Care Choice:    Choice offered to:     DME Arranged:    DME Agency:     HH Arranged:    HH Agency:     Status of Service:  Completed, signed off  If discussed at [employer] of [employer] Meetings, dates discussed:    Additional Comments:  Kermit BaloKelli F Lue Sykora, RN 05/01/2017, 4:25 PM

## 2017-05-01 NOTE — Progress Notes (Signed)
Occupational Therapy Treatment Patient Details Name: Bonnie QuamMary L Stephens MRN: [id number] DOB: [birth year] Today's Date: 05/01/2017    History of present illness [age] y.o. female admitted on 04/27/17 for AMS.  Dx with acute encephalopathy of unclear etiology r/o stroke.  MRI pending (attempted on ativan, but pt moving too much), CT was negative.  Pt with significant PMH of PAD, memory loss, HTN, diastolic HF, chronic A-fib, CVA, and CAD.  Per neuro note 04/30/17: Stroke vs. Seizure: Loss of gray-white differentiation along the left posterior insular ribbon concerning for progressive left MCA territory infarct in setting advanced small vessel disease and atrial fibrillation on Xarelto.   OT comments  Pt seen as cotreat with PT today. Apparent increased agitation this session requiring +2 total assist for all mobility and to prevent fall from chair. Pt not following 1 step simple command. Discussed need for sitter/camera room with nsg. Pt needs 24/7 direct S and is total A for all ADL. Will reassess goals next session.   Follow Up Recommendations  SNF;Supervision/Assistance - 24 hour    Equipment Recommendations   (TBA)    Recommendations for Other Services      Precautions / Restrictions Precautions Precautions: Fall       Mobility Bed Mobility Overal bed mobility: Needs Assistance Bed Mobility: Sit to Supine Rolling: Total assist;+2 for physical assistance            Transfers Overall transfer level: Needs assistance Equipment used: 2 person hand held assist Transfers: Sit to/from Stand;Stand Pivot Transfers Sit to Stand: +2 physical assistance;Max assist Stand pivot transfers: +2 physical assistance;Total assist       General transfer comment: Required use of bed pad under hips to transfer. Pt not able ot achieve full upright standing    Balance             Standing balance-Leahy Scale: Zero                             ADL either performed or assessed with  clinical judgement   ADL                                         General ADL Comments: total A with all ADL today. Unaable to follow 1 step commands     Vision       Perception     Praxis      Cognition Arousal/Alertness: Awake/alert Behavior During Therapy: Restless;Agitated;Impulsive - reaching out and grabbing onto therapist; hitting at therapist at times - nsg aware and meds given Overall Cognitive Status: No family/caregiver present to determine baseline cognitive functioning                                 General Comments: Pt agitated and attempting to crawl out of char        Exercises     Shoulder Instructions       General Comments      Pertinent Vitals/ Pain       Pain Assessment: Faces Faces Pain Scale: Hurts even more Pain Location: pt agitated Pain Descriptors / Indicators: Guarding;Grimacing Pain Intervention(s): Limited activity within patient's tolerance  Home Living  Prior Functioning/Environment              Frequency  Min 2X/week        Progress Toward Goals  OT Goals(current goals can now be found in the care plan section)  Progress towards OT goals: Not progressing toward goals - comment (increased agitation and decreased ability to folloow command)  Acute Rehab OT Goals Patient Stated Goal: unable to state OT Goal Formulation: Patient unable to participate in goal setting Time For Goal Achievement: 05/12/17 Potential to Achieve Goals: Fair (need to reassess goals) ADL Goals Pt Will Perform Eating: with min assist;sitting Pt Will Perform Grooming: with min assist;sitting Pt Will Perform Upper Body Bathing: with min assist;sitting Pt Will Perform Upper Body Dressing: with min assist;sitting Pt Will Perform Lower Body Dressing: with mod assist;sitting/lateral leans Pt Will Transfer to Toilet: with set-up;bedside commode Pt Will Perform  Toileting - Clothing Manipulation and hygiene: with mod assist;sitting/lateral leans Additional ADL Goal #1: Pt will demonstrate sustained attention during ADL task completion.   Plan Discharge plan remains appropriate    Co-evaluation    PT/OT/SLP Co-Evaluation/Treatment: Yes Reason for Co-Treatment: Necessary to address cognition/behavior during functional activity;For patient/therapist safety;To address functional/ADL transfers   OT goals addressed during session: ADL's and self-care      AM-PAC PT "6 Clicks" Daily Activity     Outcome Measure   Help from another person eating meals?: Total Help from another person taking care of personal grooming?: Total Help from another person toileting, which includes using toliet, bedpan, or urinal?: Total Help from another person bathing (including washing, rinsing, drying)?: Total Help from another person to put on and taking off regular upper body clothing?: Total Help from another person to put on and taking off regular lower body clothing?: Total 6 Click Score: 6    End of Session    OT Visit Diagnosis: Muscle weakness (generalized) (M62.81);Other abnormalities of gait and mobility (R26.89);Other symptoms and signs involving cognitive function   Activity Tolerance Treatment limited secondary to agitation   Patient Left in bed;with call bell/phone within reach;with bed alarm set;with nursing/sitter in room   Nurse Communication Mobility status;Other (comment) (increased agitation)        Time: 1310-1340 OT Time Calculation (min): 30 min  Charges: OT General Charges $OT Visit: 1 Procedure OT Treatments $Self Care/Home Management : 8-22 mins  [facility], OT/L  [phone] 05/01/2017   Ashelyn Mccravy,HILLARY 05/01/2017, 1:53 PM

## 2017-05-01 NOTE — Progress Notes (Signed)
[facility] Anticipated discharge date: 05/01/17 Family notified: Yes, by phone Transportation by: [facility]  Report #: [id number], Room [address]A  CSW signing off.  Blenda Nicelylizabeth Jady Braggs LCSW [phone]

## 2017-05-01 NOTE — Progress Notes (Signed)
Daily Progress Note   Patient Name: [name]       Date: 05/01/2017 DOB: 1932/12/10  Age: 83 y.o. MRN#: [id number] Attending Physician: Joseph Art, DO Primary Care Physician: System, Provider Not In Admit Date: 04/27/2017  Reason for Consultation/Follow-up: Establishing goals of care  Subjective: Patient is awake sitting in her chair at the nurses station. She will respond to questions but her responses are inappropriate.   We attempted to reach her nephew at multiple numbers and then spoke with her daughter [name] on the phone.  We advised [name] that her mother was eating very little today and recommended [facility] at discharge.  Initially [name] wanted to schedule a family meeting with her siblings and nieces and nephews.  Then [name] explained that her brother, [name] was angry and did not want to consider hospice.  The rest of the family allowed [name]'s opinion to stand as he is her eldest son.    I explained to [name] that if she did not go to [facility] then she will go to SNF - likely back to [facility].  [name] replied that the family was very comfortable with her return to [facility] and that we should just move forward with that rather than having a large family meeting that would likely spur more anger and be uncomfortable.  Assessment: Patient is an 83 year old female with underlying dementia who presented for right sided facial droop and progressive L MCA infarct on 04/27/2017. She has become alert and more interactive.  Her speech is nonsensical and she is eating only sips and bites.   Patient Profile/HPI: 83 y.o.femalewith past medical history of HTN, prior stroke, Diastolic HF (EF of 55%), atrial fibrillation, dementiaand a recent discharge on 8/1 after UTI  with sepsiswho was admitted on 8/5/2018with altered mental status. Her recent Echo shoes Pulmonary artery pressure of 53 mmHG (moderate pulmonary HTN), EF of 55% and LVH. She has been agitated and it has been difficult to assess her with imaging. CT shows "loss of grey-white matter along left posterior insular ribbon concerning for progressive left MSA territory infarct." Neurology is concerned for a left MCA stroke but reports that she is not a candidate for any intervention. Speech reports that she has poor awareness of food bolus and they were unable to  test multiple foods. They have noted a decline from the last swallow study. Her albumin is 3.1.    Length of Stay: 3  Current Medications: Scheduled Meds:  .  stroke: mapping our early stages of recovery book   Does not apply Once  . amLODipine  10 mg Oral Daily  . atorvastatin  80 mg Oral q1800  . donepezil  10 mg Oral QHS  . metoprolol tartrate  12.5 mg Oral BID  . rivaroxaban  15 mg Oral Q supper  . cyanocobalamin  1,000 mcg Oral Daily    Continuous Infusions:   PRN Meds: acetaminophen **OR** acetaminophen (TYLENOL) oral liquid 160 mg/5 mL **OR** acetaminophen, haloperidol lactate  Physical Exam     Patient appears to be comfortable in her chair. Skin: abraision on right upper lip  CV: irregularly rhythm, tachycardic Pulm: shallow breaths.  Abd: active bowel sounds  Neuro:  She has right sided neglect and right facial droop. She cannot follow directions. Her speech is out of context.   Vital Signs: BP 124/72 (BP Location: Right Arm)   Pulse 84   Temp 97.9 F (36.6 C) (Axillary)   Resp 18   Ht 5\' 4"  (1.626 m)   Wt 68.8 kg (151 lb 11.2 oz)   SpO2 99%   BMI 26.04 kg/m  SpO2: SpO2: 99 % O2 Device: O2 Device: Not Delivered O2 Flow Rate: O2 Flow Rate (L/min): 3 L/min  Intake/output summary: No intake or output data in the 24 hours ending 05/01/17 1400 LBM: Last BM Date:  (patient unable to advise) Baseline Weight:  Weight: 68.8 kg (151 lb 11.2 oz) Most recent weight: Weight: 68.8 kg (151 lb 11.2 oz)       Palliative Assessment/Data: 20%    Flowsheet Rows     Most Recent Value  Intake Tab  Referral Department  Hospitalist  Unit at Time of Referral  Cardiac/Telemetry Unit  Palliative Care Primary Diagnosis  Neurology [Stroke, advancing dementia]  Date Notified  04/28/17  Palliative Care Type  New Palliative care  Reason for referral  Clarify Goals of Care  Date of Admission  04/27/17  Date first seen by Palliative Care  04/29/17  # of days Palliative referral response time  1 Day(s)  # of days IP prior to Palliative referral  1  Clinical Assessment  Palliative Performance Scale Score  20%  Psychosocial & Spiritual Assessment  Palliative Care Outcomes  Patient/Family meeting held?  Yes  Who was at the meeting?  patient, dtr, nephew  Palliative Care Outcomes  Clarified goals of care, Counseled regarding hospice, Provided psychosocial or spiritual support      Patient Active Problem List   Diagnosis Date Noted  . Goals of care, counseling/discussion   . Encounter for hospice care discussion   . Right sided weakness   . Somnolence   . Palliative care encounter   . Stroke (HCC) 04/27/2017  . Dehydration 04/19/2017  . Acute lower UTI 04/19/2017  . Atrial fibrillation with RVR (HCC) 04/19/2017  . AKI (acute kidney injury) (HCC) 04/19/2017  . Sepsis (HCC) 04/19/2017  . Pure hypercholesterolemia 08/27/2016  . Encounter for therapeutic drug monitoring 01/20/2014  . Dementia with behavioral disturbance 01/01/2014  . Leukopenia 12/30/2013  . Abnormal TSH 12/30/2013  . Hypokalemia 12/30/2013  . Acute encephalopathy 12/29/2013  . Atrial fibrillation with slow ventricular response (HCC) 12/29/2013  . Thrombocytopenia, unspecified (HCC) 12/29/2013  . Bradycardia 12/29/2013  . Long term (current) use of anticoagulants 01/24/2011  . Possible CAD -  abnl myoview 2011, managed conservatively  01/17/2011  . PAD (peripheral artery disease) (HCC) 01/17/2011  . Chronic diastolic heart failure (HCC) 07/23/2010  . ANEMIA 06/19/2009  . HTN (hypertension) 06/02/2007    Palliative Care Plan    Recommendations/Plan:  Stop Aricept at discharge. Minimize medications  Careful hand feedings with aspiration precautions.    Family requests SNF on discharge.   We recommend Palliative Care to follow closely.  Goals of Care and Additional Recommendations:  Limitations on Scope of Treatment: Initiate Comfort Feeding and No Artificial Feeding  Code Status:  Full code  Prognosis:   weeks due to her ability to only eat small amounts.    Discharge Planning:  Patient's [facility]   Out patient palliative care to follow at the facility . She will need full assistance with feeding.    Care plan was discussed with Bonnie QuinLinda- patients daughter, [facility] attending, and bedside RN.  Thank you for allowing the Palliative Medicine Team to assist in the care of this patient.  Total time spent:  35 min      Greater than 50%  of this time was spent counseling and coordinating care related to the above assessment and plan.  Madison Tedrow PA-S  Norvel RichardsMarianne Cashmere Dingley, PA-C Palliative Medicine  Please contact Palliative MedicineTeam phone at [phone] for questions and concerns between 7 am - 7 pm.   Please see AMION for individual provider pager numbers.

## 2017-05-01 NOTE — Discharge Summary (Signed)
Physician Discharge Summary  [name] ZOX:[id number] DOB: 1932/10/26 DOA: 04/27/2017  PCP: System, Provider Not In  Admit date: 04/27/2017 Discharge date: 05/01/2017   Recommendations for Outpatient Follow-Up:   1. Palliative care to follow at SNF- poor overall prognosis 2. Aspiration precautions 3. DYS 1 diet thin liquids 4. Follow TSH/free t4   Discharge Diagnosis:   Active Problems:   Acute encephalopathy   Atrial fibrillation with RVR (HCC)   AKI (acute kidney injury) (HCC)   Stroke [facility](HCC)   Right sided weakness   Somnolence   Palliative care encounter   Goals of care, counseling/discussion   Encounter for hospice care discussion   Discharge disposition:  SNF  Discharge Condition: Improved.  Diet recommendation: see above  Wound care: None.   History of Present Illness:   [name] is a 83 y.o. female with medical history significant of hypertension. Chronic diastolic heart failure, atrial fibrillation, recent admission for UTI and sepsis, was discharged on 8/1 , sent back from SNF for altered mental status, . History is minimal as she is somnolent, would respond to verbal cues but wouldn ot give me any history sec to dementia.  She was seen normal around 12 and was leaning to the right. She was sent to ED for evaluation of stroke.  Most of the history is from EDP and snf notes.    Hospital Course by Problem:   CVA Initial CT head negative for acute pathology- unable to do MRI due to patient not being able to cooperate but repeat CT  shows: Loss of gray-white differentiation along the left posterior insular ribbon is concerning for progressive left MCA territory infarct. Recent echo, so will not repeat Unable to get carotid duplex as patient would not cooperate  LDL: 98-- add statin HgbA1C: 5.3 Neurology has seen- recommend comfort care -EEG not consistent with seizures -palliative care consulted  Dementia- severe:  Resume aricept  Hypertension:    -resume home meds  afib with RVR:  Resume home meds  Elevated troponin:  -decreased from last admission  Acute kidney injury:  Encourage PO intake  abnormal TSH/free t4 -outpatient follow up    Medical Consultants:    Neuro  Palliative care   Discharge Exam:   Vitals:   05/01/17 0934 05/01/17 1410  BP: 124/72 103/64  Pulse: 84 92  Resp: 18 20  Temp: 97.9 F (36.6 C) 97.8 F (36.6 C)  SpO2: 99% 100%   Vitals:   04/30/17 2220 05/01/17 0128 05/01/17 0934 05/01/17 1410  BP: 137/76 102/78 124/72 103/64  Pulse: 96 87 84 92  Resp: 19 18 18 20   Temp: 97.6 F (36.4 C) 97.7 F (36.5 C) 97.9 F (36.6 C) 97.8 F (36.6 C)  TempSrc: Axillary Axillary Axillary Axillary  SpO2: 98% 98% 99% 100%  Weight:      Height:          The results of significant diagnostics from this hospitalization (including imaging, microbiology, ancillary and laboratory) are listed below for reference.     Procedures and Diagnostic Studies:   Ct Head Wo Contrast  Result Date: 04/28/2017 CLINICAL DATA:  Stroke.  Altered mental status. EXAM: CT HEAD WITHOUT CONTRAST TECHNIQUE: Contiguous axial images were obtained from the base of the skull through the vertex without intravenous contrast. COMPARISON:  CT head without contrast 04/27/2017, 04/19/2017, and 07/17/2015. FINDINGS: Brain: The study is moderately degraded by patient motion. There is less patient motion than on the prior exam. The remote left parietal  infarct is again seen. Diffuse white matter disease is evident bilaterally. The posterior left insular ribbon and is less well seen than on prior exam. No other acute infarct is present. There is no acute hemorrhage or mass lesion. Vascular: L atherosclerotic calcifications are present within the right uterus and left cavernous internal carotid artery and at the dural margin of both vertebral arteries. There is no hyperdense vessel. Skull: The calvarium is intact. No focal lytic or  blastic lesions are present. No acute or healing fracture is present. Hyperostosis frontalis internus is noted. Sinuses/Orbits: The paranasal sinuses and mastoid air cells are clear. The globes and orbits are within normal limits. IMPRESSION: 1. Loss of gray-white differentiation along the left posterior insular ribbon is concerning for progressive left MCA territory infarct. 2. More remote left parietal lobe infarct is stable. 3. Diffuse white matter disease likely reflects the sequela of chronic microvascular ischemia. Electronically Signed   By: Marin Roberts M.D.   On: 04/28/2017 14:34     Labs:   Basic Metabolic Panel:  Recent Labs Lab 04/27/17 1404 04/27/17 1420 04/29/17 0343  NA 140 140 141  K 5.0 4.5 3.7  CL 107 107 104  CO2  --  25 26  GLUCOSE 89 93 75  BUN 38* 28* 12  CREATININE 1.30* 1.38* 0.93  CALCIUM  --  9.4 9.2   GFR Estimated Creatinine Clearance: 44.4 mL/min (by C-G formula based on SCr of 0.93 mg/dL). Liver Function Tests:  Recent Labs Lab 04/27/17 1420  AST 35  ALT 16  ALKPHOS 53  BILITOT 0.3  PROT 7.5  ALBUMIN 3.1*   No results for input(s): LIPASE, AMYLASE in the last 168 hours. No results for input(s): AMMONIA in the last 168 hours. Coagulation profile No results for input(s): INR, PROTIME in the last 168 hours.  CBC:  Recent Labs Lab 04/27/17 1404 04/27/17 1420 04/29/17 0343  WBC  --  2.9* 3.5*  NEUTROABS  --  1.3*  --   HGB 13.9 12.6 12.2  HCT 41.0 39.1 38.6  MCV  --  90.3 89.8  PLT  --  208 214   Cardiac Enzymes: No results for input(s): CKTOTAL, CKMB, CKMBINDEX, TROPONINI in the last 168 hours. BNP: Invalid input(s): POCBNP CBG:  Recent Labs Lab 04/27/17 1348  GLUCAP 84   D-Dimer No results for input(s): DDIMER in the last 72 hours. Hgb A1c No results for input(s): HGBA1C in the last 72 hours. Lipid Profile No results for input(s): CHOL, HDL, LDLCALC, TRIG, CHOLHDL, LDLDIRECT in the last 72 hours. Thyroid  function studies No results for input(s): TSH, T4TOTAL, T3FREE, THYROIDAB in the last 72 hours.  Invalid input(s): FREET3 Anemia work up No results for input(s): VITAMINB12, FOLATE, FERRITIN, TIBC, IRON, RETICCTPCT in the last 72 hours. Microbiology No results found for this or any previous visit (from the past 240 hour(s)).   Discharge Instructions:   Discharge Instructions    Discharge instructions    Complete by:  As directed    Palliative care to follow at SNF DYS 1 thin liquids   Increase activity slowly    Complete by:  As directed      Allergies as of 05/01/2017   No Known Allergies     Medication List    STOP taking these medications   amLODipine 10 MG tablet Commonly known as:  NORVASC   cefUROXime 250 MG tablet Commonly known as:  CEFTIN   LORazepam 2 MG/ML concentrated solution Commonly known as:  ATIVAN  TAKE these medications   atorvastatin 80 MG tablet Commonly known as:  LIPITOR TAKE 1 TABLET BY MOUTH DAILY AT 6 PM. What changed:  how much to take  how to take this  when to take this  additional instructions   cyanocobalamin 500 MCG tablet Take 1,000 mcg by mouth daily.   donepezil 10 MG tablet Commonly known as:  ARICEPT Take 1 tablet (10 mg total) by mouth at bedtime.   haloperidol 2 MG/ML solution Commonly known as:  HALDOL Take 1 mL (2 mg total) by mouth every 6 (six) hours as needed for agitation.   metoprolol tartrate 25 MG tablet Commonly known as:  LOPRESSOR Take 0.5 tablets (12.5 mg total) by mouth 2 (two) times daily.   Rivaroxaban 15 MG Tabs tablet Commonly known as:  XARELTO Take 1 tablet (15 mg total) by mouth daily with supper. What changed:  medication strength  how much to take   sodium chloride 0.65 % Soln nasal spray Commonly known as:  OCEAN Place 1 spray into both nostrils 3 (three) times daily as needed for congestion.      Follow-up Information    physician at SNF-palliative care to follow at SNF  Follow up.            Time coordinating discharge: 35 min  Signed:  Baine Decesare U Whittney Steenson   [facility] 05/01/2017, 3:45 PM

## 2017-05-01 NOTE — Progress Notes (Signed)
  Speech Language Pathology Treatment: Dysphagia;Cognitive-Linquistic  Patient Details Name: [name] MRN: [id number] DOB: 1933-01-29 Today's Date: 05/01/2017 Time: [id number] SLP Time Calculation (min) (ACUTE ONLY): 15 min  Assessment / Plan / Recommendation Clinical Impression  Pt consumed an entire 4 oz container of pudding with Mod cues provided for oral holding. One cough was observed when pt tried to start talking with food still in her mouth, concerning for penetration/aspiration of premature spillage. She had less intake with thin liquids (only one sip from the straw) but with no obvious signs of aspiration. Would continue current diet for comfort feedings. Cognitively, pt had improved sustained attention to intake although still needing Mod-Max cues. She could not make choices when given binary options. She is impulsive and tries to climb out of her chair without safety awareness.    HPI HPI: 83 y.o. female admitted on 04/27/17 for AMS.  Dx with acute encephalopathy of unclear etiology r/o stroke.  MRI pending (attempted on ativan, but pt moving too much), CT was negative.  Pt with significant PMH of PAD, memory loss, HTN, diastolic HF, chronic A-fib, CVA, and CAD.      SLP Plan  Continue with current plan of care       Recommendations  Diet recommendations: Dysphagia 1 (puree);Thin liquid Liquids provided via: Cup;Straw Medication Administration: Crushed with puree Supervision: Full supervision/cueing for compensatory strategies;Staff to assist with self feeding Compensations: Slow rate;Small sips/bites Postural Changes and/or Swallow Maneuvers: Seated upright 90 degrees;Upright 30-60 min after meal                Oral Care Recommendations: Oral care QID Follow up Recommendations: Other (comment) (SNF versus Hospice pending intake) SLP Visit Diagnosis: Dysphagia, unspecified (R13.10);Cognitive communication deficit (O97.353(R41.841) Plan: Continue with current plan of  care       GO                [name], [name] 05/01/2017, 12:04 PM  Bonnie HamLaura Stephens, M.A. CCC-SLP [phone]

## 2017-05-01 NOTE — Progress Notes (Signed)
Physical Therapy Treatment Patient Details Name: [name] MRN: [id number] DOB: 18-Mar-1933 Today's Date: 05/01/2017    History of Present Illness 83 y.o. female admitted on 04/27/17 for AMS.  Dx with acute encephalopathy of unclear etiology r/o stroke.  MRI pending (attempted on ativan, but pt moving too much), CT was negative.  Pt with significant PMH of PAD, memory loss, HTN, diastolic HF, chronic A-fib, CVA, and CAD.  Per neuro note 04/30/17: Stroke vs. Seizure: Loss of gray-white differentiation along the left posterior insular ribbon concerning for progressive left MCA territory infarct in setting advanced small vessel disease and atrial fibrillation on Xarelto.    PT Comments    Pt with significant increase in restlessness and in heightened state of anxiety when PT/OT attempted to return pt safely to bed from recliner chair at RN station.  Pt was trying to climb over the side of the recliner chair and was starting to get unsafe.  We attempted to stand to ambulate her, but this was unsuccessful. She required two person max assist just to get safely back to bed.  She remains appropriate for SNF level rehab at discharge.    Follow Up Recommendations  SNF     Equipment Recommendations  None recommended by PT    Recommendations for Other Services   NA     Precautions / Restrictions Precautions Precautions: Fall Precaution Comments: right sided weakness    Mobility  Bed Mobility Overal bed mobility: Needs Assistance Bed Mobility: Sit to Supine Rolling: Total assist;+2 for physical assistance     Sit to supine: +2 for physical assistance;Total assist   General bed mobility comments: Two person total assist to lift both legs and progress trunk back into bed, and even then pt immediately sitting up in long sitting in bed.    Transfers Overall transfer level: Needs assistance Equipment used: 2 person hand held assist Transfers: Sit to/from [employer] Sit to Stand:  +2 physical assistance;Max assist Stand pivot transfers: +2 physical assistance;Max assist       General transfer comment: Two person max assist with bed pad to pick pt up and safely return her to the bed from the recliner chair.  Pt not assisting in any helpful way during the transfer because she is too restless, and in too much distress.  She is clutching and grasping at therapist frantically.   Ambulation/Gait             General Gait Details: Attempted, becasue we thought it would help her restlessness, but pt unable to today even with two person assist.        Modified Rankin (Stroke Patients Only) Modified Rankin (Stroke Patients Only) Pre-Morbid Rankin Score: Moderately severe disability Modified Rankin: Severe disability     Balance Overall balance assessment: Needs assistance Sitting-balance support: Feet supported;Bilateral upper extremity supported Sitting balance-Leahy Scale: Zero     Standing balance support: Bilateral upper extremity supported Standing balance-Leahy Scale: Zero                              Cognition Arousal/Alertness: Awake/alert Behavior During Therapy: Restless;Agitated;Impulsive Overall Cognitive Status: Impaired/Different from baseline Area of Impairment: Orientation;Attention;Memory;Following commands;Safety/judgement;Awareness;Problem solving                 Orientation Level: Disoriented to;Person;Place;Time;Situation Current Attention Level: Focused Memory: Decreased recall of precautions;Decreased short-term memory Following Commands: Follows one step commands inconsistently Safety/Judgement: Decreased awareness of safety;Decreased awareness of deficits  Awareness: Intellectual Problem Solving: Decreased initiation;Difficulty sequencing;Requires verbal cues;Requires tactile cues General Comments: Pt attempting to crawl out, over, around the recliner chair.  Pt with increasing distress the more we try to keep  her in the chair and from falling out.              Pertinent Vitals/Pain Pain Assessment: Faces Faces Pain Scale: Hurts even more Pain Location: generalized with increased restlessness. Pain Descriptors / Indicators: Guarding;Grimacing Pain Intervention(s): Limited activity within patient's tolerance;Monitored during session;Repositioned           PT Goals (current goals can now be found in the care plan section) Acute Rehab PT Goals Patient Stated Goal: unable to state Progress towards PT goals: Not progressing toward goals - comment (more restless, confused and agitated today)    Frequency    Min 3X/week      PT Plan Current plan remains appropriate    Co-evaluation PT/OT/SLP Co-Evaluation/Treatment: Yes Reason for Co-Treatment: Complexity of the patient's impairments (multi-system involvement);Necessary to address cognition/behavior during functional activity;For patient/therapist safety;To address functional/ADL transfers PT goals addressed during session: Mobility/safety with mobility;Balance OT goals addressed during session: ADL's and self-care      AM-PAC PT "6 Clicks" Daily Activity  Outcome Measure  Difficulty turning over in bed (including adjusting bedclothes, sheets and blankets)?: Total Difficulty moving from lying on back to sitting on the side of the bed? : Total Difficulty sitting down on and standing up from a chair with arms (e.g., wheelchair, bedside commode, etc,.)?: Total Help needed moving to and from a bed to chair (including a wheelchair)?: Total Help needed walking in hospital room?: Total Help needed climbing 3-5 steps with a railing? : Total 6 Click Score: 6    End of Session Equipment Utilized During Treatment: Gait belt Activity Tolerance: Treatment limited secondary to agitation Patient left: in bed;with call bell/phone within reach;with bed alarm set Nurse Communication: Mobility status PT Visit Diagnosis: Difficulty in walking,  not elsewhere classified (R26.2)     Time: 1310-1340 PT Time Calculation (min) (ACUTE ONLY): 30 min  Charges:  $Therapeutic Activity: 8-22 mins          Estefanie Cornforth B. Arlissa Monteverde, PT, DPT [phone]            05/01/2017, 4:45 PM

## 2017-05-01 NOTE — Progress Notes (Signed)
PROGRESS NOTE    [name]  ZHY:[id number] DOB: 1933/07/22 DOA: 04/27/2017 PCP: System, Provider Not In   Outpatient Specialists:     Brief Narrative:  Bonnie QuamMary L Stephens is a 83 y.o. female with medical history significant of hypertension. Chronic diastolic heart failure, atrial fibrillation, dementia, recent admission for UTI and sepsis, was discharged on 8/1 , sent back from SNF.  Brought back to hospital for AMS and found to have CVA.  Has had limited intake and residential hospice has been recommended to family  Assessment & Plan:   Active Problems:   Acute encephalopathy   Atrial fibrillation with RVR (HCC)   AKI (acute kidney injury) (HCC)   Stroke [facility](HCC)   Right sided weakness   Somnolence   Palliative care encounter   Goals of care, counseling/discussion   Encounter for hospice care discussion   CVA Initial CT head negative for acute pathology- unable to do MRI due to patient not being able to cooperate but repeat CT  shows: Loss of gray-white differentiation along the left posterior insular ribbon is concerning for progressive left MCA territory infarct. Recent echo, so will not repeat Unable to get carotid duplex as patient would not cooperate  LDL: 98-- add statin HgbA1C: 5.3 Neurology has seen- recommend comfort care -EEG not consistent with seizures -palliative care consult appreciated  Dementia- severe:  Resume aricept  Hypertension:  -resume home meds  afib with RVR:  Resume home meds  Elevated troponin:  -decreased from last admission  Acute kidney injury:  Hold ace inhibitor  Resolved with hydration  abnormal TSH/free t4 -outpatient follow up   DVT prophylaxis:  SCD's  Code Status: Full Code   Family Communication: Daughter 8/8  Disposition Plan:  Poor overall outcome: comfort care--residential hospice recommended   Consultants:   Neuro  Palliative care    Subjective: In nursing station-- refusing more than a few  bites  Objective: Vitals:   04/30/17 1746 04/30/17 2220 05/01/17 0128 05/01/17 0934  BP: (!) 143/82 137/76 102/78 124/72  Pulse: (!) 110 96 87 84  Resp: 20 19 18 18   Temp: 97.9 F (36.6 C) 97.6 F (36.4 C) 97.7 F (36.5 C) 97.9 F (36.6 C)  TempSrc: Axillary Axillary Axillary Axillary  SpO2: 97% 98% 98% 99%  Weight:      Height:       No intake or output data in the 24 hours ending 05/01/17 1312 Filed Weights   04/27/17 1700  Weight: 68.8 kg (151 lb 11.2 oz)    Examination:  General exam: in chair in nursing station Respiratory system: no increase work of breathing Cardiovascular system: irr Gastrointestinal system: +BS, soft Central nervous system: not cooperative Skin: skin dry     Data Reviewed: I have personally reviewed following labs and imaging studies  CBC:  Recent Labs Lab 04/27/17 1404 04/27/17 1420 04/29/17 0343  WBC  --  2.9* 3.5*  NEUTROABS  --  1.3*  --   HGB 13.9 12.6 12.2  HCT 41.0 39.1 38.6  MCV  --  90.3 89.8  PLT  --  208 214   Basic Metabolic Panel:  Recent Labs Lab 04/27/17 1404 04/27/17 1420 04/29/17 0343  NA 140 140 141  K 5.0 4.5 3.7  CL 107 107 104  CO2  --  25 26  GLUCOSE 89 93 75  BUN 38* 28* 12  CREATININE 1.30* 1.38* 0.93  CALCIUM  --  9.4 9.2   GFR: Estimated Creatinine Clearance: 44.4 mL/min (by  C-G formula based on SCr of 0.93 mg/dL). Liver Function Tests:  Recent Labs Lab 04/27/17 1420  AST 35  ALT 16  ALKPHOS 53  BILITOT 0.3  PROT 7.5  ALBUMIN 3.1*   No results for input(s): LIPASE, AMYLASE in the last 168 hours. No results for input(s): AMMONIA in the last 168 hours. Coagulation Profile: No results for input(s): INR, PROTIME in the last 168 hours. Cardiac Enzymes: No results for input(s): CKTOTAL, CKMB, CKMBINDEX, TROPONINI in the last 168 hours. BNP (last 3 results) No results for input(s): PROBNP in the last 8760 hours. HbA1C: No results for input(s): HGBA1C in the last 72  hours. CBG:  Recent Labs Lab 04/27/17 1348  GLUCAP 84   Lipid Profile: No results for input(s): CHOL, HDL, LDLCALC, TRIG, CHOLHDL, LDLDIRECT in the last 72 hours. Thyroid Function Tests: No results for input(s): TSH, T4TOTAL, FREET4, T3FREE, THYROIDAB in the last 72 hours. Anemia Panel: No results for input(s): VITAMINB12, FOLATE, FERRITIN, TIBC, IRON, RETICCTPCT in the last 72 hours. Urine analysis:    Component Value Date/Time   COLORURINE YELLOW 04/19/2017 2047   APPEARANCEUR CLEAR 04/19/2017 2047   LABSPEC 1.011 04/19/2017 2047   PHURINE 5.0 04/19/2017 2047   GLUCOSEU NEGATIVE 04/19/2017 2047   HGBUR MODERATE (A) 04/19/2017 2047   BILIRUBINUR NEGATIVE 04/19/2017 2047   BILIRUBINUR small 07/29/2016 1103   KETONESUR 5 (A) 04/19/2017 2047   PROTEINUR 30 (A) 04/19/2017 2047   UROBILINOGEN 1.0 07/29/2016 1103   UROBILINOGEN 1.0 04/14/2015 1030   NITRITE POSITIVE (A) 04/19/2017 2047   LEUKOCYTESUR TRACE (A) 04/19/2017 2047      No results found for this or any previous visit (from the past 240 hour(s)).    Anti-infectives    Start     Dose/Rate Route Frequency Ordered Stop   04/27/17 1745  cefUROXime (CEFTIN) tablet 250 mg    Comments:  Patient taking differently: Has 3 doses left     250 mg Oral 2 times daily with meals 04/27/17 1742 04/29/17 0759       Radiology Studies: No results found.      Scheduled Meds: .  stroke: mapping our early stages of recovery book   Does not apply Once  . amLODipine  10 mg Oral Daily  . atorvastatin  80 mg Oral q1800  . donepezil  10 mg Oral QHS  . metoprolol tartrate  12.5 mg Oral BID  . rivaroxaban  15 mg Oral Q supper  . cyanocobalamin  1,000 mcg Oral Daily   Continuous Infusions:    LOS: 3 days    Time spent: 25 min    Joscelynn Brutus U Linnette Panella, DO [facility] Pager [phone]  If 7PM-7AM, please contact night-coverage [email or web address] Password [email or web address] 05/01/2017, 1:12 PM

## 2017-05-28 ENCOUNTER — Encounter (HOSPITAL_COMMUNITY): Payer: Self-pay | Admitting: Emergency Medicine

## 2017-05-28 ENCOUNTER — Emergency Department (HOSPITAL_COMMUNITY): Payer: Medicare Other

## 2017-05-28 ENCOUNTER — Emergency Department (HOSPITAL_COMMUNITY)
Admission: EM | Admit: 2017-05-28 | Discharge: 2017-05-29 | Disposition: A | Discharge status: Home / Self Care | Payer: Medicare Other | Attending: Emergency Medicine | Admitting: Emergency Medicine

## 2017-05-28 DIAGNOSIS — W19XXXA Unspecified fall, initial encounter: Secondary | ICD-10-CM | POA: Diagnosis not present

## 2017-05-28 DIAGNOSIS — Z7901 Long term (current) use of anticoagulants: Secondary | ICD-10-CM | POA: Diagnosis not present

## 2017-05-28 DIAGNOSIS — I110 Hypertensive heart disease with heart failure: Secondary | ICD-10-CM | POA: Insufficient documentation

## 2017-05-28 DIAGNOSIS — F0390 Unspecified dementia without behavioral disturbance: Secondary | ICD-10-CM | POA: Diagnosis not present

## 2017-05-28 DIAGNOSIS — Z8673 Personal history of transient ischemic attack (TIA), and cerebral infarction without residual deficits: Secondary | ICD-10-CM | POA: Insufficient documentation

## 2017-05-28 DIAGNOSIS — Y999 Unspecified external cause status: Secondary | ICD-10-CM | POA: Insufficient documentation

## 2017-05-28 DIAGNOSIS — Y939 Activity, unspecified: Secondary | ICD-10-CM | POA: Insufficient documentation

## 2017-05-28 DIAGNOSIS — S0003XA Contusion of scalp, initial encounter: Secondary | ICD-10-CM | POA: Diagnosis present

## 2017-05-28 DIAGNOSIS — I5032 Chronic diastolic (congestive) heart failure: Secondary | ICD-10-CM | POA: Insufficient documentation

## 2017-05-28 DIAGNOSIS — I2510 Atherosclerotic heart disease of native coronary artery without angina pectoris: Secondary | ICD-10-CM | POA: Insufficient documentation

## 2017-05-28 DIAGNOSIS — Y929 Unspecified place or not applicable: Secondary | ICD-10-CM | POA: Diagnosis not present

## 2017-05-28 DIAGNOSIS — Z79899 Other long term (current) drug therapy: Secondary | ICD-10-CM | POA: Diagnosis not present

## 2017-05-28 LAB — CBG MONITORING, ED: GLUCOSE-CAPILLARY: 86 mg/dL (ref 65–99)

## 2017-05-28 MED ORDER — HALOPERIDOL LACTATE 5 MG/ML IJ SOLN
2.0000 mg | Freq: Once | INTRAMUSCULAR | Status: AC
  Administered 2017-05-28: 2 mg via INTRAMUSCULAR
  Filled 2017-05-28: qty 1

## 2017-05-28 MED ORDER — METOPROLOL TARTRATE 5 MG/5ML IV SOLN
2.5000 mg | Freq: Once | INTRAVENOUS | Status: DC
Start: 2017-05-28 — End: 2017-05-28
  Filled 2017-05-28: qty 5

## 2017-05-28 MED ORDER — METOPROLOL TARTRATE 25 MG PO TABS
25.0000 mg | ORAL_TABLET | Freq: Once | ORAL | Status: AC
  Administered 2017-05-28: 25 mg via ORAL
  Filled 2017-05-28: qty 1

## 2017-05-28 NOTE — ED Provider Notes (Signed)
Patient returned from CT. Is calm. Interacting. Still at baseline dementia. Frontal scalp hematoma. No intracranial abnormalities. No skull fracture. C-spine films negative. Was just given by mouth metoprolol. Would like to see improvement of her rate. If rate improves on by mouth meds will be appropriate for discharge back to her care facility. We'll asked to have her hold her anticoagulation for the next 5 days.   Rolland PorterJames, Jeymi Hepp, MD 05/28/17 [phone]

## 2017-05-28 NOTE — ED Notes (Signed)
Checked patient blood sugar it was 86 notified RN of blood sugar

## 2017-05-28 NOTE — ED Notes (Signed)
Attempt[facility] to [facility] was on [facility] for 5 min x 2.

## 2017-05-28 NOTE — ED Provider Notes (Addendum)
[facility]-EMERGENCY DEPT Provider Note   CSN: [id number] Arrival date & time: 05/28/17  1207   History   Chief Complaint Chief Complaint  Patient presents with  . Fall    HPI [name] is a 83 y.o. female.  The patient has a history of Dementia, CVA, A-Fib, PAD and CAD. She arrived to the ED on stretcher. She was seen laying in bed, screaming out in pain, with multiple bandages on her forehead. The patient was unable to provide history and did not respond to interviewer's questions. Per chart review her behavior is at baseline. Notes received from SNF where the patient resides stated that the patient fell from her wheelchair. She is also currently on Xarelto for anti-coagulation.     Past Medical History:  Diagnosis Date  . CAD (coronary artery disease)    a. Possible dx -  Lexiscan myoview (9/11): EF 76%, normal wall motion, possible small anterior MI with mild peri-infarct ischemia but cannot rule out breast attenuation, managed conservatively.  . Cerebrovascular accident, embolic (HCC)    left frontal  . Chronic atrial fibrillation (HCC)    a. Chronic. Has had cardioembolic right renal infarct in 12/06 and cardioembolic event to the right arm requiring right brachial embolectomy. Both events occurred while subtherapeutic on coumadin. b. Slow VR 12/2013.  . Diastolic HF (heart failure) (HCC)    Echo (7/11): EF 60-65%, moderate LVH, severe diastolic dysfunction.   . Dyslipidemia   . HTN (hypertension)    a. Resistant HTN. Patient states that this has been poorly controlled for years.   . Iron deficiency anemia   . Memory loss   . Obese   . PAD (peripheral artery disease) (HCC)    a. peripheral arterial dopplers (5/12) with right mid popliteal artery occlusion with reconstitution of arteries below the knees by collaterals. Only mild reduction in ABI on the right (not tissue-threatening).    Patient Active Problem List   Diagnosis Date Noted  . Goals of care,  counseling/discussion   . Encounter for hospice care discussion   . Right sided weakness   . Somnolence   . Palliative care encounter   . Stroke (HCC) 04/27/2017  . Dehydration 04/19/2017  . Acute lower UTI 04/19/2017  . Atrial fibrillation with RVR (HCC) 04/19/2017  . AKI (acute kidney injury) (HCC) 04/19/2017  . Sepsis (HCC) 04/19/2017  . Pure hypercholesterolemia 08/27/2016  . Encounter for therapeutic drug monitoring 01/20/2014  . Dementia with behavioral disturbance 01/01/2014  . Leukopenia 12/30/2013  . Abnormal TSH 12/30/2013  . Hypokalemia 12/30/2013  . Acute encephalopathy 12/29/2013  . Atrial fibrillation with slow ventricular response (HCC) 12/29/2013  . Thrombocytopenia, unspecified (HCC) 12/29/2013  . Bradycardia 12/29/2013  . Long term (current) use of anticoagulants 01/24/2011  . Possible CAD - abnl myoview 2011, managed conservatively 01/17/2011  . PAD (peripheral artery disease) (HCC) 01/17/2011  . Chronic diastolic heart failure (HCC) 07/23/2010  . ANEMIA 06/19/2009  . HTN (hypertension) 06/02/2007   Past Surgical History:  Procedure Laterality Date  . EMBOLECTOMY     right brachial embolectomy    OB History    No data available      Home Medications    Prior to Admission medications   Medication Sig Start Date End Date Taking? Authorizing Provider  atorvastatin (LIPITOR) 80 MG tablet TAKE 1 TABLET BY MOUTH DAILY AT 6 PM. Patient taking differently: Take 80 mg by mouth at bedtime.  07/29/16   Pete GlatterLangeland, Dawn T, MD  cyanocobalamin 500 MCG  tablet Take 1,000 mcg by mouth daily.    [provider]  donepezil (ARICEPT) 10 MG tablet Take 1 tablet (10 mg total) by mouth at bedtime. 07/29/16   Pete Glatter, MD  haloperidol (HALDOL) 2 MG/ML solution Take 1 mL (2 mg total) by mouth every 6 (six) hours as needed for agitation. 05/01/17   Joseph Art, DO  metoprolol tartrate (LOPRESSOR) 25 MG tablet Take 0.5 tablets (12.5 mg total) by mouth 2 (two)  times daily. 04/23/17   Regalado, Belkys A, MD  Rivaroxaban (XARELTO) 15 MG TABS tablet Take 1 tablet (15 mg total) by mouth daily with supper. 05/01/17   Joseph Art, DO  sodium chloride (OCEAN) 0.65 % SOLN nasal spray Place 1 spray into both nostrils 3 (three) times daily as needed for congestion. 04/05/15   Marisa Severin, MD   Family History Family History  Problem Relation Age of Onset  . Anemia Father   . Asthma Father   . Hypertension Mother    Social History Social History  Substance Use Topics  . Smoking status: Never Smoker  . Smokeless tobacco: Never Used  . Alcohol use No    Allergies   Patient has no known allergies.   Review of Systems Review of Systems  Unable to perform ROS: Dementia   Physical Exam Updated Vital Signs BP (!) 161/120 (BP Location: Left Arm)   Pulse 85   Resp (!) 24   Ht 5\' 6"  (1.676 m)   Wt 68.5 kg (151 lb)   SpO2 90%   BMI 24.37 kg/m   Physical Exam  Constitutional:  Elderly woman laying in bed, intermittently yelling. Patient has clean dry bandages in place on forehead  HENT:  Patient has hematoma with small, 1 cm overlaying superficial laceration on right forehead. No active bleeding. Patient has dry bandage on L eyebrow with a small <1cm laceration that is not actively bleeding.   Eyes: EOM are normal.  Cardiovascular: Normal rate, regular rhythm and intact distal pulses.   No murmur heard. Pulmonary/Chest: Effort normal. No respiratory distress. She has no wheezes.  Abdominal: Soft. She exhibits no distension. There is no tenderness. There is no guarding.  Musculoskeletal: She exhibits no edema (of bilateral lower extremities) or tenderness (of bilateral lower extremities).  Neurological:  Unable to obtain patient orientation 2/2 dementia. Patient not cooperative with EOM, sensation, and strength testing. Gross motor of face, upper and lower extremities intact bilaterally.  Skin: Skin is warm and dry. Capillary refill takes less  than 2 seconds. No rash noted. No erythema.   ED Treatments / Results  Labs (all labs ordered are listed, but only abnormal results are displayed) Labs Reviewed  CBG MONITORING, ED    EKG  EKG Interpretation None      Radiology No results found.  Procedures Procedures (including critical care time)  Medications Ordered in ED Medications  haloperidol lactate (HALDOL) injection 2 mg (2 mg Intramuscular Given 05/28/17 1319)    Initial Impression / Assessment and Plan / ED Course  I have reviewed the triage vital signs and the nursing notes.  Pertinent labs & imaging results that were available during my care of the patient were reviewed by me and considered in my medical decision making (see chart for details).  Patient presents with multiple contusions on head, with a reported history of multiple falls. It is difficult to evaluate mental status and perform a neuro exam given her dementia and lack of cooperativity with exam. She  is currently on anticoagulation for hx of a-fib, which is a concerning risk factor for intracranial bleed after a fall. Will obtain head and neck CT to evaluate for acute fractures and potential brain bleed. No other lab work needed.  The patient's HR elevated with A-Fib on EKG with rate of 120. Unclear if she has taken medication today. Will give home metoprolol 25 mg in ED to maintain rate control and continue to monitor.  Patient discussed with Dr. Jacqulyn Bath Final Clinical Impressions(s) / ED Diagnoses   Final diagnoses:  None    New Prescriptions New Prescriptions   No medications on file     Rozann Lesches, MD 05/28/17 1459    Rolland Porter, MD 05/28/17 1516    Rolland Porter, MD 05/28/17 [phone]

## 2017-05-28 NOTE — ED Notes (Signed)
Patient has history of dementia. Doesn't follow commands but is actively moving all extremities. EMS reports this is patient's baseline.

## 2017-05-28 NOTE — ED Triage Notes (Signed)
PER EMS: Pt is coming from skilled nursing facility.  Pt has had multiple falls in the last 24 hours.  Pt is coming in today for a witnessed fall she had an hour ago. Pt has lac over L eye and hematoma over R eye.  Pt is on blood thinners.  Pt is crying out in pain and unable to answer questions, per EMS this is Pt's baseline dementia.    148/70 70's NSR

## 2017-05-28 NOTE — ED Notes (Signed)
[facility] contacted for tx to [facility]

## 2017-05-28 NOTE — ED Notes (Signed)
Got patient hooked up to the monitor patient is resting with call bell in reach 

## 2017-05-28 NOTE — ED Notes (Signed)
Patient transported to CT on heart monitor

## 2017-05-28 NOTE — ED Notes (Signed)
Discharge given to PTAR, attempted to call facility for report x 2 with no answer. Patient remains at dementia baseline. Patient unable to sign.

## 2017-05-28 NOTE — Discharge Instructions (Signed)
Do not take Xarelto for the next 5 days.

## 2017-06-04 ENCOUNTER — Encounter (HOSPITAL_COMMUNITY): Payer: Self-pay | Admitting: Emergency Medicine

## 2017-06-04 ENCOUNTER — Emergency Department (HOSPITAL_COMMUNITY): Payer: Medicare Other

## 2017-06-04 ENCOUNTER — Emergency Department (EMERGENCY_DEPARTMENT_HOSPITAL)
Admission: EM | Admit: 2017-06-04 | Discharge: 2017-06-05 | Disposition: A | Discharge status: Home / Self Care | Payer: Medicare Other | Source: Home / Self Care | Attending: Emergency Medicine | Admitting: Emergency Medicine

## 2017-06-04 DIAGNOSIS — I110 Hypertensive heart disease with heart failure: Secondary | ICD-10-CM | POA: Insufficient documentation

## 2017-06-04 DIAGNOSIS — I2510 Atherosclerotic heart disease of native coronary artery without angina pectoris: Secondary | ICD-10-CM | POA: Insufficient documentation

## 2017-06-04 DIAGNOSIS — R531 Weakness: Secondary | ICD-10-CM | POA: Diagnosis not present

## 2017-06-04 DIAGNOSIS — I5032 Chronic diastolic (congestive) heart failure: Secondary | ICD-10-CM

## 2017-06-04 DIAGNOSIS — R4182 Altered mental status, unspecified: Secondary | ICD-10-CM

## 2017-06-04 DIAGNOSIS — Z8673 Personal history of transient ischemic attack (TIA), and cerebral infarction without residual deficits: Secondary | ICD-10-CM | POA: Insufficient documentation

## 2017-06-04 DIAGNOSIS — F0390 Unspecified dementia without behavioral disturbance: Secondary | ICD-10-CM | POA: Insufficient documentation

## 2017-06-04 DIAGNOSIS — Z79899 Other long term (current) drug therapy: Secondary | ICD-10-CM

## 2017-06-04 DIAGNOSIS — Z7901 Long term (current) use of anticoagulants: Secondary | ICD-10-CM | POA: Insufficient documentation

## 2017-06-04 DIAGNOSIS — I482 Chronic atrial fibrillation: Secondary | ICD-10-CM | POA: Diagnosis not present

## 2017-06-04 LAB — COMPREHENSIVE METABOLIC PANEL
ALT: 22 U/L (ref 14–54)
AST: 41 U/L (ref 15–41)
Albumin: 2.9 g/dL — ABNORMAL LOW (ref 3.5–5.0)
Alkaline Phosphatase: 78 U/L (ref 38–126)
Anion gap: 9 (ref 5–15)
BUN: 22 mg/dL — ABNORMAL HIGH (ref 6–20)
CO2: 24 mmol/L (ref 22–32)
Calcium: 9.1 mg/dL (ref 8.9–10.3)
Chloride: 102 mmol/L (ref 101–111)
Creatinine, Ser: 1.44 mg/dL — ABNORMAL HIGH (ref 0.44–1.00)
GFR calc Af Amer: 38 mL/min — ABNORMAL LOW (ref >60)
GFR calc non Af Amer: 33 mL/min — ABNORMAL LOW (ref >60)
Glucose, Bld: 95 mg/dL (ref 65–99)
Potassium: 4.8 mmol/L (ref 3.5–5.1)
Sodium: 135 mmol/L (ref 135–145)
Total Bilirubin: 0.9 mg/dL (ref 0.3–1.2)
Total Protein: 6.7 g/dL (ref 6.5–8.1)

## 2017-06-04 LAB — I-STAT CHEM 8, ED
BUN: 26 mg/dL — ABNORMAL HIGH (ref 6–20)
Calcium, Ion: 1.05 mmol/L — ABNORMAL LOW (ref 1.15–1.40)
Chloride: 103 mmol/L (ref 101–111)
Creatinine, Ser: 1.4 mg/dL — ABNORMAL HIGH (ref 0.44–1.00)
Glucose, Bld: 105 mg/dL — ABNORMAL HIGH (ref 65–99)
HCT: 40 % (ref 36.0–46.0)
Hemoglobin: 13.6 g/dL (ref 12.0–15.0)
Potassium: 4.7 mmol/L (ref 3.5–5.1)
Sodium: 139 mmol/L (ref 135–145)
TCO2: 27 mmol/L (ref 22–32)

## 2017-06-04 LAB — I-STAT TROPONIN, ED: Troponin i, poc: 0.04 ng/mL (ref 0.00–0.08)

## 2017-06-04 LAB — CBC
HCT: 41.3 % (ref 36.0–46.0)
Hemoglobin: 13 g/dL (ref 12.0–15.0)
MCH: 28.7 pg (ref 26.0–34.0)
MCHC: 31.5 g/dL (ref 30.0–36.0)
MCV: 91.2 fL (ref 78.0–100.0)
Platelets: 121 10*3/uL — ABNORMAL LOW (ref 150–400)
RBC: 4.53 MIL/uL (ref 3.87–5.11)
RDW: 14.4 % (ref 11.5–15.5)
WBC: 3 10*3/uL — ABNORMAL LOW (ref 4.0–10.5)

## 2017-06-04 LAB — DIFFERENTIAL
Basophils Absolute: 0 10*3/uL (ref 0.0–0.1)
Basophils Relative: 1 %
Eosinophils Absolute: 0 10*3/uL (ref 0.0–0.7)
Eosinophils Relative: 0 %
Lymphocytes Relative: 28 %
Lymphs Abs: 0.8 10*3/uL (ref 0.7–4.0)
Monocytes Absolute: 0.4 10*3/uL (ref 0.1–1.0)
Monocytes Relative: 13 %
Neutro Abs: 1.7 10*3/uL (ref 1.7–7.7)
Neutrophils Relative %: 58 %

## 2017-06-04 LAB — CBG MONITORING, ED: GLUCOSE-CAPILLARY: 98 mg/dL (ref 65–99)

## 2017-06-04 NOTE — ED Triage Notes (Signed)
[facility] for stroke like symptoms, pt LSW today at 1915, Pt normally AO to self only with a hx of dementia, pt having a new drooling left side face, visual changes and right side weakness. Pt is on blood thinners and fell today from her wheel chair at SNF.   BP 125/ 106  HR 76  R 15  SPO2 96  CBG 98

## 2017-06-04 NOTE — ED Notes (Addendum)
Wrong pt

## 2017-06-04 NOTE — Consult Note (Signed)
Requesting Physician: Dr. Juleen China    Chief Complaint: Left face was drawing  History obtained from:   Patient and Chart    HPI:                                                                                                                                       [name] is an 83 y.o. female Dementia, CVA, A-Fib on Xarelto, PAD and CAD with a fall earlier the day presents as a stroke alert for 'left facial drawing"  witnessed by nurse at 7.15 pm. Patient was more confused than baseline and EMS was called brought [facility] as a stroke alert. . She had a fall earlier in the day. On arrival, patient was not following commands, had no drift in either extremity and mumbling incoherently and mild right side weakness. . BP was 160 systolic. Stat CT head showed no evidence of hemorrhage, contusion and demonstrated old left MCA stroke.     Date last known well: 9.12.18 Time last known well:  7.15 pm tPA Given: No, on anticoagulation FAST ED: 4 ( EMS) VANS- negative  Modified Rankin: 4   Past Medical History:  Diagnosis Date  . CAD (coronary artery disease)    a. Possible dx -  Lexiscan myoview (9/11): EF 76%, normal wall motion, possible small anterior MI with mild peri-infarct ischemia but cannot rule out breast attenuation, managed conservatively.  . Cerebrovascular accident, embolic (HCC)    left frontal  . Chronic atrial fibrillation (HCC)    a. Chronic. Has had cardioembolic right renal infarct in 12/06 and cardioembolic event to the right arm requiring right brachial embolectomy. Both events occurred while subtherapeutic on coumadin. b. Slow VR 12/2013.  . Diastolic HF (heart failure) (HCC)    Echo (7/11): EF 60-65%, moderate LVH, severe diastolic dysfunction.   . Dyslipidemia   . HTN (hypertension)    a. Resistant HTN. Patient states that this has been poorly controlled for years.   . Iron deficiency anemia   . Memory loss   . Obese   . PAD (peripheral artery disease) (HCC)    a.  peripheral arterial dopplers (5/12) with right mid popliteal artery occlusion with reconstitution of arteries below the knees by collaterals. Only mild reduction in ABI on the right (not tissue-threatening).    Past Surgical History:  Procedure Laterality Date  . EMBOLECTOMY     right brachial embolectomy    Family History  Problem Relation Age of Onset  . Anemia Father   . Asthma Father   . Hypertension Mother    Social History:  reports that she has never smoked. She has never used smokeless tobacco. She reports that she does not drink alcohol or use drugs.  Allergies: No Known Allergies } Medications:  Reviewed  ROS:                                                                                                                                       Unable to obtain    Examination:                                                                                                      General: Appears mildly in distress Psych: Affect appropriate to situation Eyes: No scleral injection HENT: No OP obstrucion Head: Normocephalic.  Cardiovascular: Normal rate and regular rhythm.  Respiratory: Effort normal and breath sounds normal to anterior ascultation GI: Soft.  No distension. There is no tenderness.  Skin: WDI   Neurological Examination Mental Status: Alert, not otreinted, not following commands, mumbles incoherently Cranial Nerves: II:  Visual fields : blinks to threat bilaterally, mild right facial droop, intermittent left facial twitches Motor: Right : Upper extremity   4+/5    Left:     Upper extremity   4+/5  Lower extremity   4/5     Lower extremity   4+/5 Tone and bulk:normal tone throughout; no atrophy noted Sensory: withdrew to noxious stimulus bilaterally Deep Tendon Reflexes: 2+ and symmetric throughout Plantars: Right:  downgoing   Left: downgoing Cerebellar: No gross ataxia on examination Gait: did not assess,    Lab Results: Basic Metabolic Panel:  Recent Labs Lab 06/04/17 2017 06/04/17 2028  NA 135 139  K 4.8 4.7  CL 102 103  CO2 24  --   GLUCOSE 95 105*  BUN 22* 26*  CREATININE 1.44* 1.40*  CALCIUM 9.1  --     CBC:  Recent Labs Lab 06/04/17 2017 06/04/17 2028  WBC 3.0*  --   NEUTROABS 1.7  --   HGB 13.0 13.6  HCT 41.3 40.0  MCV 91.2  --   PLT 121*  --     Coagulation Studies: No results for input(s): LABPROT, INR in the last 72 hours.  Imaging: Ct Head Code Stroke Wo Contrast  Result Date: 06/04/2017 CLINICAL DATA:  Code stroke. Initial evaluation for acute left facial droop. Fall. EXAM: CT HEAD WITHOUT CONTRAST TECHNIQUE: Contiguous axial images were obtained from the base of the skull through the vertex without intravenous contrast. COMPARISON:  Prior CT from 05/28/2017. FINDINGS: Brain: Examination limited by positioning and motion. Age-related cerebral atrophy with chronic microvascular ischemic disease. Remote posterior left MCA territory infarct. Associated laminar necrosis, similar to previous. No acute intracranial  hemorrhage. No evidence for acute large vessel territory infarct. No mass lesion or midline shift. No mass effect. No hydrocephalus. No extra-axial fluid collection. Vascular: No hyperdense vessel. Scattered vascular calcifications noted within the carotid siphons. Skull: Multifocal frontal scalp contusions.  Calvarium intact. Sinuses/Orbits: Globes normal soft tissues within normal limits. Visualized paranasal sinuses are clear. Small bilateral mastoid effusions, left greater than right noted. Other: None. ASPECTS [facility] Stroke Program Early CT Score) - Ganglionic level infarction (caudate, lentiform nuclei, internal capsule, insula, M1-M3 cortex): 7 - Supraganglionic infarction (M4-M6 cortex): 3 Total score (0-10 with 10 being normal): 10 IMPRESSION: 1. No acute  intracranial abnormality. 2. ASPECTS is 10 3. Multifocal soft tissue contusions about the forehead. Calvarium intact. 4. Stable atrophy with chronic microvascular ischemic disease. Remote left MCA territory infarct with associated laminar necrosis. Critical Value/emergent results were called by telephone at the time of interpretation on 06/04/2017 at 8:41 pm to Dr. Laurence Slate, who verbally acknowledged these results. Electronically Signed   By: Rise Mu M.D.   On: 06/04/2017 20:43     ASSESSMENT AND PLAN  83 year old female history of Dementia, CVA, A-Fib, PAD and CAD with a fall earlier the day presents as a stroke alert for 'left facial drawing" . Patient not on commands and mumbles nonsensically. No focal motor deficits apart from mild right hemiparesis which appears to be baseline from a old left MCA stroke.  Differentials Acute ischemic stroke Seizure Delirium  Recommendations Not a TPA candidate as patient on oral anticoagulation and unclear if stroke. CTA deferred,  Has poor baseline MRS and would not be a candidate for MT.  MRI brain in stable  Infectious and metabolic workup Continue Xarelto, statin Continue home donepezil Nothing by mouth until passes swallow

## 2017-06-05 LAB — URINALYSIS, ROUTINE W REFLEX MICROSCOPIC
Bacteria, UA: NONE SEEN
Bilirubin Urine: NEGATIVE
Glucose, UA: NEGATIVE mg/dL
Ketones, ur: NEGATIVE mg/dL
Leukocytes, UA: NEGATIVE
Nitrite: NEGATIVE
Protein, ur: NEGATIVE mg/dL
Specific Gravity, Urine: 1.008 (ref 1.005–1.030)
Squamous Epithelial / LPF: NONE SEEN
pH: 5 (ref 5.0–8.0)

## 2017-06-05 NOTE — ED Provider Notes (Signed)
[facility]-EMERGENCY DEPT Provider Note   CSN: [id number] Arrival date & time: 06/04/17  2007   An emergency department physician performed an initial assessment on this suspected stroke patient at 2016.  History   Chief Complaint Chief Complaint  Patient presents with  . Code Stroke    HPI [name] is a 83 y.o. female.  HPI   83 year old female initially made code stroke. Reportedly confused from her baseline in the face was "drawing in." Patient doesn't voice any specific complaints although I'm not sure how much she is understandably me. She is anticoagulated. She was evaluated by neurologyin  the emergency room.  Past Medical History:  Diagnosis Date  . CAD (coronary artery disease)    a. Possible dx -  Lexiscan myoview (9/11): EF 76%, normal wall motion, possible small anterior MI with mild peri-infarct ischemia but cannot rule out breast attenuation, managed conservatively.  . Cerebrovascular accident, embolic (HCC)    left frontal  . Chronic atrial fibrillation (HCC)    a. Chronic. Has had cardioembolic right renal infarct in 12/06 and cardioembolic event to the right arm requiring right brachial embolectomy. Both events occurred while subtherapeutic on coumadin. b. Slow VR 12/2013.  . Diastolic HF (heart failure) (HCC)    Echo (7/11): EF 60-65%, moderate LVH, severe diastolic dysfunction.   . Dyslipidemia   . HTN (hypertension)    a. Resistant HTN. Patient states that this has been poorly controlled for years.   . Iron deficiency anemia   . Memory loss   . Obese   . PAD (peripheral artery disease) (HCC)    a. peripheral arterial dopplers (5/12) with right mid popliteal artery occlusion with reconstitution of arteries below the knees by collaterals. Only mild reduction in ABI on the right (not tissue-threatening).    Patient Active Problem List   Diagnosis Date Noted  . Goals of care, counseling/discussion   . Encounter for hospice care discussion   . Right sided  weakness   . Somnolence   . Palliative care encounter   . Stroke (HCC) 04/27/2017  . Dehydration 04/19/2017  . Acute lower UTI 04/19/2017  . Atrial fibrillation with RVR (HCC) 04/19/2017  . AKI (acute kidney injury) (HCC) 04/19/2017  . Sepsis (HCC) 04/19/2017  . Pure hypercholesterolemia 08/27/2016  . Encounter for therapeutic drug monitoring 01/20/2014  . Dementia with behavioral disturbance 01/01/2014  . Leukopenia 12/30/2013  . Abnormal TSH 12/30/2013  . Hypokalemia 12/30/2013  . Acute encephalopathy 12/29/2013  . Atrial fibrillation with slow ventricular response (HCC) 12/29/2013  . Thrombocytopenia, unspecified (HCC) 12/29/2013  . Bradycardia 12/29/2013  . Long term (current) use of anticoagulants 01/24/2011  . Possible CAD - abnl myoview 2011, managed conservatively 01/17/2011  . PAD (peripheral artery disease) (HCC) 01/17/2011  . Chronic diastolic heart failure (HCC) 07/23/2010  . ANEMIA 06/19/2009  . HTN (hypertension) 06/02/2007    Past Surgical History:  Procedure Laterality Date  . EMBOLECTOMY     right brachial embolectomy    OB History    No data available       Home Medications    Prior to Admission medications   Medication Sig Start Date End Date Taking? Authorizing Provider  acetaminophen (TYLENOL) 650 MG CR tablet Take 650 mg by mouth every 4 (four) hours as needed (for pain).   Yes [provider]  atorvastatin (LIPITOR) 80 MG tablet TAKE 1 TABLET BY MOUTH DAILY AT 6 PM. Patient taking differently: Take 80 mg by mouth every evening.  07/29/16  Yes Langeland, Dawn T, MD  citalopram (CELEXA) 10 MG tablet Take 10 mg by mouth daily.   Yes [provider]  cyanocobalamin 500 MCG tablet Take 500 mcg by mouth daily.    Yes [provider]  donepezil (ARICEPT) 10 MG tablet Take 1 tablet (10 mg total) by mouth at bedtime. 07/29/16  Yes Langeland, Dawn T, MD  metoprolol tartrate (LOPRESSOR) 25 MG tablet Take 0.5 tablets (12.5 mg total)  by mouth 2 (two) times daily. 04/23/17  Yes Regalado, Belkys A, MD  Rivaroxaban (XARELTO) 15 MG TABS tablet Take 1 tablet (15 mg total) by mouth daily with supper. 05/01/17  Yes Joseph Art, DO  sodium chloride (OCEAN) 0.65 % SOLN nasal spray Place 1 spray into both nostrils 3 (three) times daily as needed for congestion. 04/05/15  Yes Marisa Severin, MD  traMADol (ULTRAM) 50 MG tablet Take 50 mg by mouth every 12 (twelve) hours as needed (for pain not first relieved by Tylenol).   Yes [provider]  haloperidol (HALDOL) 2 MG/ML solution Take 1 mL (2 mg total) by mouth every 6 (six) hours as needed for agitation. Patient not taking: Reported on 06/04/2017 05/01/17   Joseph Art, DO    Family History Family History  Problem Relation Age of Onset  . Anemia Father   . Asthma Father   . Hypertension Mother     Social History Social History  Substance Use Topics  . Smoking status: Never Smoker  . Smokeless tobacco: Never Used  . Alcohol use No     Allergies   Patient has no known allergies.   Review of Systems Review of Systems  Level V caveat because of dementia. Physical Exam Updated Vital Signs BP (!) 151/92   Pulse 86   Temp 98.2 F (36.8 C) (Oral)   Resp 13   Ht  (1.676 m)   Wt 67.4 kg (148 lb 9.4 oz)   SpO2 91%   BMI 23.98 kg/m   Physical Exam  Constitutional: She appears well-developed and well-nourished. No distress.  HENT:  Head: Normocephalic and atraumatic.  Eyes: Pupils are equal, round, and reactive to light. Conjunctivae are normal. Right eye exhibits no discharge. Left eye exhibits no discharge.  Neck: Neck supple.  Cardiovascular: Normal rate, regular rhythm and normal heart sounds.  Exam reveals no gallop and no friction rub.   No murmur heard. Pulmonary/Chest: Effort normal and breath sounds normal. No respiratory distress.  Abdominal: Soft. She exhibits no distension. There is no tenderness.  Musculoskeletal: She exhibits no edema or  tenderness.  Neurological:  Laying in bed. No acute distress. Opens eyes to voice. Mumbles name but I cannot make out her speech otherwise. Right-sided weakness?  Skin: Skin is warm and dry.  Nursing note and vitals reviewed.    ED Treatments / Results  Labs (all labs ordered are listed, but only abnormal results are displayed) Labs Reviewed  CBC - Abnormal; Notable for the following:       Result Value   WBC 3.0 (*)    Platelets 121 (*)    All other components within normal limits  COMPREHENSIVE METABOLIC PANEL - Abnormal; Notable for the following:    BUN 22 (*)    Creatinine, Ser 1.44 (*)    Albumin 2.9 (*)    GFR calc non Af Amer 33 (*)    GFR calc Af Amer 38 (*)    All other components within normal limits  URINALYSIS, ROUTINE W  REFLEX MICROSCOPIC - Abnormal; Notable for the following:    Color, Urine STRAW (*)    Hgb urine dipstick SMALL (*)    All other components within normal limits  I-STAT CHEM 8, ED - Abnormal; Notable for the following:    BUN 26 (*)    Creatinine, Ser 1.40 (*)    Glucose, Bld 105 (*)    Calcium, Ion 1.05 (*)    All other components within normal limits  DIFFERENTIAL  PROTIME-INR  APTT  CBG MONITORING, ED  I-STAT TROPONIN, ED  CBG MONITORING, ED    EKG  EKG Interpretation None       Radiology Ct Head Code Stroke Wo Contrast  Result Date: 06/04/2017 CLINICAL DATA:  Code stroke. Initial evaluation for acute left facial droop. Fall. EXAM: CT HEAD WITHOUT CONTRAST TECHNIQUE: Contiguous axial images were obtained from the base of the skull through the vertex without intravenous contrast. COMPARISON:  Prior CT from 05/28/2017. FINDINGS: Brain: Examination limited by positioning and motion. Age-related cerebral atrophy with chronic microvascular ischemic disease. Remote posterior left MCA territory infarct. Associated laminar necrosis, similar to previous. No acute intracranial hemorrhage. No evidence for acute large vessel territory infarct.  No mass lesion or midline shift. No mass effect. No hydrocephalus. No extra-axial fluid collection. Vascular: No hyperdense vessel. Scattered vascular calcifications noted within the carotid siphons. Skull: Multifocal frontal scalp contusions.  Calvarium intact. Sinuses/Orbits: Globes normal soft tissues within normal limits. Visualized paranasal sinuses are clear. Small bilateral mastoid effusions, left greater than right noted. Other: None. ASPECTS [facility] Stroke Program Early CT Score) - Ganglionic level infarction (caudate, lentiform nuclei, internal capsule, insula, M1-M3 cortex): 7 - Supraganglionic infarction (M4-M6 cortex): 3 Total score (0-10 with 10 being normal): 10 IMPRESSION: 1. No acute intracranial abnormality. 2. ASPECTS is 10 3. Multifocal soft tissue contusions about the forehead. Calvarium intact. 4. Stable atrophy with chronic microvascular ischemic disease. Remote left MCA territory infarct with associated laminar necrosis. Critical Value/emergent results were called by telephone at the time of interpretation on 06/04/2017 at 8:41 pm to Dr. Laurence Slate, who verbally acknowledged these results. Electronically Signed   By: Rise Mu M.D.   On: 06/04/2017 20:43    Procedures Procedures (including critical care time)  Medications Ordered in ED Medications - No data to display   Initial Impression / Assessment and Plan / ED Course  I have reviewed the triage vital signs and the nursing notes.  Pertinent labs & imaging results that were available during my care of the patient were reviewed by me and considered in my medical decision making (see chart for details).     84yF brought in as Code Stroke. Calm. Interactive. Disoriented to time and place but otherwise seems like her baseline. Recent admit with stroke work up. On xarelto. I do not feel repeat hospitalization or testing of much utility at this time. Discussed with her daughter [name] and informed of plan to DC back to  facility tonight.   Final Clinical Impressions(s) / ED Diagnoses   Final diagnoses:  None    New Prescriptions New Prescriptions   No medications on file     Raeford Razor, MD 06/20/17 1329

## 2017-06-05 NOTE — ED Notes (Signed)
Daughter Bonita QuinLinda called and would love to talk with anyone about her mother:  [phone]

## 2017-06-05 NOTE — ED Notes (Signed)
[facility], report called to RN on SN[facility] Tela, RN.

## 2017-06-05 NOTE — ED Notes (Signed)
[facility]

## 2017-06-06 ENCOUNTER — Inpatient Hospital Stay (HOSPITAL_COMMUNITY)
Admission: EM | Admit: 2017-06-06 | Discharge: 2017-06-11 | DRG: 308 | Disposition: A | Discharge status: Skilled Nursing Facility | Payer: Medicare Other | Attending: Internal Medicine | Admitting: Internal Medicine

## 2017-06-06 ENCOUNTER — Emergency Department (HOSPITAL_COMMUNITY): Payer: Medicare Other

## 2017-06-06 ENCOUNTER — Encounter (HOSPITAL_COMMUNITY): Payer: Self-pay | Admitting: Emergency Medicine

## 2017-06-06 DIAGNOSIS — E049 Nontoxic goiter, unspecified: Secondary | ICD-10-CM | POA: Diagnosis present

## 2017-06-06 DIAGNOSIS — W19XXXA Unspecified fall, initial encounter: Secondary | ICD-10-CM | POA: Diagnosis present

## 2017-06-06 DIAGNOSIS — E7800 Pure hypercholesterolemia, unspecified: Secondary | ICD-10-CM | POA: Diagnosis present

## 2017-06-06 DIAGNOSIS — G9341 Metabolic encephalopathy: Secondary | ICD-10-CM | POA: Diagnosis present

## 2017-06-06 DIAGNOSIS — I130 Hypertensive heart and chronic kidney disease with heart failure and stage 1 through stage 4 chronic kidney disease, or unspecified chronic kidney disease: Secondary | ICD-10-CM | POA: Diagnosis present

## 2017-06-06 DIAGNOSIS — I10 Essential (primary) hypertension: Secondary | ICD-10-CM

## 2017-06-06 DIAGNOSIS — I700 Atherosclerosis of aorta: Secondary | ICD-10-CM | POA: Diagnosis present

## 2017-06-06 DIAGNOSIS — N183 Chronic kidney disease, stage 3 unspecified: Secondary | ICD-10-CM

## 2017-06-06 DIAGNOSIS — I2510 Atherosclerotic heart disease of native coronary artery without angina pectoris: Secondary | ICD-10-CM | POA: Diagnosis present

## 2017-06-06 DIAGNOSIS — I4891 Unspecified atrial fibrillation: Secondary | ICD-10-CM | POA: Diagnosis not present

## 2017-06-06 DIAGNOSIS — I6300 Cerebral infarction due to thrombosis of unspecified precerebral artery: Secondary | ICD-10-CM | POA: Diagnosis not present

## 2017-06-06 DIAGNOSIS — F03918 Unspecified dementia, unspecified severity, with other behavioral disturbance: Secondary | ICD-10-CM | POA: Diagnosis present

## 2017-06-06 DIAGNOSIS — R531 Weakness: Secondary | ICD-10-CM | POA: Diagnosis present

## 2017-06-06 DIAGNOSIS — D649 Anemia, unspecified: Secondary | ICD-10-CM | POA: Diagnosis present

## 2017-06-06 DIAGNOSIS — E876 Hypokalemia: Secondary | ICD-10-CM | POA: Diagnosis present

## 2017-06-06 DIAGNOSIS — B9620 Unspecified Escherichia coli [E. coli] as the cause of diseases classified elsewhere: Secondary | ICD-10-CM | POA: Diagnosis present

## 2017-06-06 DIAGNOSIS — S0101XA Laceration without foreign body of scalp, initial encounter: Secondary | ICD-10-CM | POA: Diagnosis present

## 2017-06-06 DIAGNOSIS — R1310 Dysphagia, unspecified: Secondary | ICD-10-CM | POA: Diagnosis present

## 2017-06-06 DIAGNOSIS — I5032 Chronic diastolic (congestive) heart failure: Secondary | ICD-10-CM | POA: Diagnosis present

## 2017-06-06 DIAGNOSIS — I639 Cerebral infarction, unspecified: Secondary | ICD-10-CM | POA: Diagnosis present

## 2017-06-06 DIAGNOSIS — I69392 Facial weakness following cerebral infarction: Secondary | ICD-10-CM

## 2017-06-06 DIAGNOSIS — Z79899 Other long term (current) drug therapy: Secondary | ICD-10-CM

## 2017-06-06 DIAGNOSIS — F0391 Unspecified dementia with behavioral disturbance: Secondary | ICD-10-CM | POA: Diagnosis present

## 2017-06-06 DIAGNOSIS — Z7901 Long term (current) use of anticoagulants: Secondary | ICD-10-CM

## 2017-06-06 DIAGNOSIS — I482 Chronic atrial fibrillation: Principal | ICD-10-CM | POA: Diagnosis present

## 2017-06-06 DIAGNOSIS — I340 Nonrheumatic mitral (valve) insufficiency: Secondary | ICD-10-CM | POA: Diagnosis not present

## 2017-06-06 DIAGNOSIS — E785 Hyperlipidemia, unspecified: Secondary | ICD-10-CM | POA: Diagnosis present

## 2017-06-06 LAB — COMPREHENSIVE METABOLIC PANEL
ALT: 27 U/L (ref 14–54)
ANION GAP: 9 (ref 5–15)
AST: 39 U/L (ref 15–41)
Albumin: 3.2 g/dL — ABNORMAL LOW (ref 3.5–5.0)
Alkaline Phosphatase: 82 U/L (ref 38–126)
BILIRUBIN TOTAL: 1.6 mg/dL — AB (ref 0.3–1.2)
BUN: 21 mg/dL — ABNORMAL HIGH (ref 6–20)
CO2: 28 mmol/L (ref 22–32)
Calcium: 9.4 mg/dL (ref 8.9–10.3)
Chloride: 103 mmol/L (ref 101–111)
Creatinine, Ser: 1.3 mg/dL — ABNORMAL HIGH (ref 0.44–1.00)
GFR calc Af Amer: 43 mL/min — ABNORMAL LOW (ref >60)
GFR, EST NON AFRICAN AMERICAN: 37 mL/min — AB (ref >60)
Glucose, Bld: 74 mg/dL (ref 65–99)
POTASSIUM: 4.1 mmol/L (ref 3.5–5.1)
Sodium: 140 mmol/L (ref 135–145)
TOTAL PROTEIN: 7.1 g/dL (ref 6.5–8.1)

## 2017-06-06 LAB — CBC
HCT: 38.8 % (ref 36.0–46.0)
HEMOGLOBIN: 12.6 g/dL (ref 12.0–15.0)
MCH: 28.8 pg (ref 26.0–34.0)
MCHC: 32.5 g/dL (ref 30.0–36.0)
MCV: 88.6 fL (ref 78.0–100.0)
PLATELETS: 141 10*3/uL — AB (ref 150–400)
RBC: 4.38 MIL/uL (ref 3.87–5.11)
RDW: 14.5 % (ref 11.5–15.5)
WBC: 2.9 10*3/uL — AB (ref 4.0–10.5)

## 2017-06-06 LAB — PROTIME-INR
INR: 1.27
Prothrombin Time: 15.8 seconds — ABNORMAL HIGH (ref 11.4–15.2)

## 2017-06-06 LAB — TROPONIN I: TROPONIN I: 0.04 ng/mL — AB (ref <0.03)

## 2017-06-06 LAB — CBG MONITORING, ED
GLUCOSE-CAPILLARY: 57 mg/dL — AB (ref 65–99)
GLUCOSE-CAPILLARY: 68 mg/dL (ref 65–99)

## 2017-06-06 LAB — I-STAT CG4 LACTIC ACID, ED: Lactic Acid, Venous: 1.65 mmol/L (ref 0.5–1.9)

## 2017-06-06 LAB — BRAIN NATRIURETIC PEPTIDE: B Natriuretic Peptide: 276.5 pg/mL — ABNORMAL HIGH (ref 0.0–100.0)

## 2017-06-06 LAB — I-STAT TROPONIN, ED: Troponin i, poc: 0.04 ng/mL (ref 0.00–0.08)

## 2017-06-06 LAB — LACTIC ACID, PLASMA: LACTIC ACID, VENOUS: 1.4 mmol/L (ref 0.5–1.9)

## 2017-06-06 LAB — CK: CK TOTAL: 130 U/L (ref 38–234)

## 2017-06-06 MED ORDER — DILTIAZEM HCL 25 MG/5ML IV SOLN
5.0000 mg | Freq: Once | INTRAVENOUS | Status: AC
  Administered 2017-06-06: 5 mg via INTRAVENOUS

## 2017-06-06 MED ORDER — DEXTROSE 50 % IV SOLN
1.0000 | Freq: Once | INTRAVENOUS | Status: AC
  Administered 2017-06-06: 50 mL via INTRAVENOUS
  Filled 2017-06-06: qty 50

## 2017-06-06 MED ORDER — SODIUM CHLORIDE 0.9 % IV BOLUS (SEPSIS): 1000.0000 mL | Freq: Once | INTRAVENOUS | Status: DC

## 2017-06-06 MED ORDER — ACETAMINOPHEN 650 MG RE SUPP: 650.0000 mg | Freq: Four times a day (QID) | RECTAL | Status: DC | PRN

## 2017-06-06 MED ORDER — DILTIAZEM HCL 25 MG/5ML IV SOLN
5.0000 mg | Freq: Once | INTRAVENOUS | Status: AC
  Administered 2017-06-06: 5 mg via INTRAVENOUS
  Filled 2017-06-06: qty 5

## 2017-06-06 MED ORDER — ONDANSETRON HCL 4 MG/2ML IJ SOLN: 4.0000 mg | Freq: Four times a day (QID) | INTRAMUSCULAR | Status: DC | PRN

## 2017-06-06 MED ORDER — DILTIAZEM HCL-DEXTROSE 100-5 MG/100ML-% IV SOLN (PREMIX)
5.0000 mg/h | Freq: Once | INTRAVENOUS | Status: AC
  Administered 2017-06-06: 5 mg/h via INTRAVENOUS
  Filled 2017-06-06: qty 100

## 2017-06-06 MED ORDER — DILTIAZEM HCL-DEXTROSE 100-5 MG/100ML-% IV SOLN (PREMIX)
5.0000 mg/h | Freq: Once | INTRAVENOUS | Status: AC
  Administered 2017-06-07: 5 mg/h via INTRAVENOUS
  Filled 2017-06-06: qty 100

## 2017-06-06 MED ORDER — HALOPERIDOL LACTATE 5 MG/ML IJ SOLN
1.0000 mg | Freq: Two times a day (BID) | INTRAMUSCULAR | Status: DC | PRN
  Administered 2017-06-08 – 2017-06-09 (×2): 1 mg via INTRAVENOUS
  Filled 2017-06-06 (×2): qty 1

## 2017-06-06 MED ORDER — DEXTROSE-NACL 5-0.45 % IV SOLN
INTRAVENOUS | Status: AC
  Administered 2017-06-06 – 2017-06-09 (×5): via INTRAVENOUS

## 2017-06-06 MED ORDER — SODIUM CHLORIDE 0.9 % IV BOLUS (SEPSIS)
500.0000 mL | Freq: Once | INTRAVENOUS | Status: AC
  Administered 2017-06-06: 500 mL via INTRAVENOUS

## 2017-06-06 MED ORDER — DILTIAZEM HCL 25 MG/5ML IV SOLN
10.0000 mg | Freq: Once | INTRAVENOUS | Status: AC
  Administered 2017-06-06: 10 mg via INTRAVENOUS

## 2017-06-06 NOTE — Progress Notes (Signed)
CSW spoke with [facility] from [facility] and they are able to accept patient today 9/14. Marylene Land requested Fl2 to be completed before patient is discharged. CSW completed FL2 with [facility]- faxed to facility. Will update RN once [facility] can be called for transport.   Stacy Gardner, [facility] Emergency Room Clinical Social Worker [phone]
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
## 2017-06-06 NOTE — ED Triage Notes (Addendum)
Per EMS son called EMS related to pt "not wanting to get up." With EMS arrival pt laying in urine, feces noted on bed, and reported no medicine given in 2 days. Pt has two hematomas noted on head. Pt hx of dementia carries conversation with triage.

## 2017-06-06 NOTE — ED Notes (Signed)
Plan to treat HR then discharge to facility.

## 2017-06-06 NOTE — ED Notes (Addendum)
Per [name], pt daughter pt was sent home with "my brother who walks with a walker and can't take care of her." Daughter continues to verbalizes placement made to [facility]. Daughter contact information [phone].

## 2017-06-06 NOTE — ED Notes (Signed)
Attempted to call report

## 2017-06-06 NOTE — ED Notes (Signed)
Bed: ZO10 Expected date:  Expected time:  Means of arrival:  Comments: 83 yo AMS

## 2017-06-06 NOTE — Progress Notes (Signed)
CSW received a call from [facility] Pt has been accepted by: [facility] Number for report is: [phone] ext [phone] Pt's unit/room/bed number will be: 226 Bed A Accepting physician: Dr. Joeseph Amor  Pt can arrive ASAP on 06/06/17   CSW will update RN and EDP.  IF PT ARRIVES AFTER 5PM PLEASE ADVISE EMS THE COMBINATION TO THE FRONT DOOR IS *[phone]!!!  Dorothe Pea. Vallorie Niccoli, LCSW, LCAS, CSI Clinical Social Worker Ph: [phone]

## 2017-06-06 NOTE — H&P (Addendum)
History and Physical    [name]:[id number] DOB: 1932-12-03 DOA: 06/06/2017  Referring MD/NP/PA:   PCP: System, Provider Not In   Patient coming from:  The patient is coming from home.  At baseline, pt is dependent for most of ADL.  Chief Complaint: weakness, possible fall, worsening mental status.   HPI: [name] is a 83 y.o. female with medical history significant of hypertension, hyperlipidemia, PVD, dCHF, atrial fibrillation on xarelto, stroke, CAD, dementia, SKD-III, who presents with generalized weakness, worsening mental status and possible fall.   Patient has dementia (not sure about the baseline mental status), is unable to provide accurate medical history, therefore, most of the history is obtained by discussing the case with ED physician, per EMS report, and with the nursing staff. I have tried 4 times to contact her daughter without success.  Patient was recently hospitalized from 8/5-8/9 for possible MCA stroke. Pt was unable to do MRI due to patient not being able to cooperate, but repeated CT showed loss of gray-white differentiation along the left posterior insular ribbon is concerning for progressive left MCA territory infarct. She is on Xarelto for A fib.   Per reports, today pt has generalized weakness, worsening mental status and is more confused. She was found in her feces and urine. Per EDP, Dr. Marylen Ponto note, pt was isoriented to time and place, but otherwise seems like her baseline. Dr. Juleen China did not feel repeat hospitalization or testing of much utility at this time. Dr. Juleen China discussed with her daughter [name] and informed of plan to discharge back to facility with plans of palliative care follow-up. EDP, Dr. Corlis Leak also discussed with her daughter on the phone and made a plan to discharge patient to nursing home. SW has worked out a Higher education careers adviser to d/c pt to [facility], but pt was found to A fib with RVR with HR up to 140, therefor pt needs to be admitted to  hospital.   When I patient in ED, she is confused and not oriented 3. She is not following any commands. No active nausea, vomiting, diarrhea, respiratory distress, cough noted. She moves all extremities. She has right facial droop (not sure if this is new). She has 2 hematomas in frontal head.  ED Course: pt was found to have WBC 2.9, lactic acid 1.65, negative troponin, stable renal function, temperature normal, tachycardia, tachypnea, oxygen saturation 100% on room air, blood pressure 166/122. CT head is negative for acute intracranial abnormalities. Chest x-ray negative. Pending urinalysis. Patient is admitted to stepdown as inpatient. Cardizem drip was started.  Review of Systems: Could not be reviewed due to dementia.  Allergy: No Known Allergies  Past Medical History:  Diagnosis Date  . CAD (coronary artery disease)    a. Possible dx -  Lexiscan myoview (9/11): EF 76%, normal wall motion, possible small anterior MI with mild peri-infarct ischemia but cannot rule out breast attenuation, managed conservatively.  . Cerebrovascular accident, embolic (HCC)    left frontal  . Chronic atrial fibrillation (HCC)    a. Chronic. Has had cardioembolic right renal infarct in 12/06 and cardioembolic event to the right arm requiring right brachial embolectomy. Both events occurred while subtherapeutic on coumadin. b. Slow VR 12/2013.  . Diastolic HF (heart failure) (HCC)    Echo (7/11): EF 60-65%, moderate LVH, severe diastolic dysfunction.   . Dyslipidemia   . HTN (hypertension)    a. Resistant HTN. Patient states that this has been poorly controlled for years.   Marland Kitchen  Iron deficiency anemia   . Memory loss   . Obese   . PAD (peripheral artery disease) (HCC)    a. peripheral arterial dopplers (5/12) with right mid popliteal artery occlusion with reconstitution of arteries below the knees by collaterals. Only mild reduction in ABI on the right (not tissue-threatening).    Past Surgical History:    Procedure Laterality Date  . EMBOLECTOMY     right brachial embolectomy    Social History:  reports that she has never smoked. She has never used smokeless tobacco. She reports that she does not drink alcohol or use drugs.  Family History:  Family History  Problem Relation Age of Onset  . Anemia Father   . Asthma Father   . Hypertension Mother      Prior to Admission medications   Medication Sig Start Date End Date Taking? Authorizing Provider  acetaminophen (TYLENOL) 650 MG CR tablet Take 650 mg by mouth every 4 (four) hours as needed (for pain).    [provider]  atorvastatin (LIPITOR) 80 MG tablet TAKE 1 TABLET BY MOUTH DAILY AT 6 PM. Patient taking differently: Take 80 mg by mouth every evening.  07/29/16   Pete Glatter, MD  citalopram (CELEXA) 10 MG tablet Take 10 mg by mouth daily.    [provider]  cyanocobalamin 500 MCG tablet Take 500 mcg by mouth daily.     [provider]  donepezil (ARICEPT) 10 MG tablet Take 1 tablet (10 mg total) by mouth at bedtime. 07/29/16   Pete Glatter, MD  haloperidol (HALDOL) 2 MG/ML solution Take 1 mL (2 mg total) by mouth every 6 (six) hours as needed for agitation. Patient not taking: Reported on 06/04/2017 05/01/17   Joseph Art, DO  metoprolol tartrate (LOPRESSOR) 25 MG tablet Take 0.5 tablets (12.5 mg total) by mouth 2 (two) times daily. 04/23/17   Regalado, Belkys A, MD  Rivaroxaban (XARELTO) 15 MG TABS tablet Take 1 tablet (15 mg total) by mouth daily with supper. 05/01/17   Joseph Art, DO  sodium chloride (OCEAN) 0.65 % SOLN nasal spray Place 1 spray into both nostrils 3 (three) times daily as needed for congestion. 04/05/15   Marisa Severin, MD  traMADol (ULTRAM) 50 MG tablet Take 50 mg by mouth every 12 (twelve) hours as needed (for pain not first relieved by Tylenol).    [provider]    Physical Exam: Vitals:   06/06/17 1600 06/06/17 1615 06/06/17 1700 06/06/17 1829  BP: (!) 164/118  (!) 169/114 (!) 166/122 (!) 167/123  Pulse:    (!) 103  Resp: (!) 25 (!) Temp:      TempSrc:      SpO2:    100%  Weight:      Height:       General: Not in acute distress HEENT: has two hematomas in frontal head.       Eyes: PERRL, EOMI, no scleral icterus.       ENT: No discharge from the ears and nose, no pharynx injection, no tonsillar enlargement.        Neck: No JVD, no bruit, no mass felt. Heme: No neck lymph node enlargement. Cardiac: S1/S2, Irregularly irregular rhythm, No murmurs, No gallops or rubs. Respiratory: No rales, wheezing, rhonchi or rubs. GI: Soft, nondistended, nontender, no organomegaly, BS present. GU: No hematuria Ext: No pitting leg edema bilaterally. 2+DP/PT pulse bilaterally. Musculoskeletal: No joint deformities, No joint redness or warmth, no  limitation of ROM in spin. Skin: No rashes.  Neuro: confused, not oriented X3, not following commands. Cranial nerves II-XII grossly intact except for right facial droop, moves all extremities normally.  Psych: Patient is not psychotic, no suicidal or hemocidal ideation.  Labs on Admission: I have personally reviewed following labs and imaging studies  CBC:  Recent Labs Lab 06/04/17 2017 06/04/17 2028 06/06/17 1223  WBC 3.0*  --  2.9*  NEUTROABS 1.7  --   --   HGB 13.0 13.6 12.6  HCT 41.3 40.0 38.8  MCV 91.2  --  88.6  PLT 121*  --  141*   Basic Metabolic Panel:  Recent Labs Lab 06/04/17 2017 06/04/17 2028 06/06/17 1224  NA 135 139 140  K 4.8 4.7 4.1  CL 102 103 103  CO2 24  --  28  GLUCOSE 95 105* 74  BUN 22* 26* 21*  CREATININE 1.44* 1.40* 1.30*  CALCIUM 9.1  --  9.4   GFR: Estimated Creatinine Clearance: 31.2 mL/min (A) (by C-G formula based on SCr of 1.3 mg/dL (H)). Liver Function Tests:  Recent Labs Lab 06/04/17 2017 06/06/17 1224  AST 41 39  ALT 22 27  ALKPHOS 78 82  BILITOT 0.9 1.6*  PROT 6.7 7.1  ALBUMIN 2.9* 3.2*   No results for input(s): LIPASE, AMYLASE in  the last 168 hours. No results for input(s): AMMONIA in the last 168 hours. Coagulation Profile:  Recent Labs Lab 06/06/17 1223  INR 1.27   Cardiac Enzymes: No results for input(s): CKTOTAL, CKMB, CKMBINDEX, TROPONINI in the last 168 hours. BNP (last 3 results) No results for input(s): PROBNP in the last 8760 hours. HbA1C: No results for input(s): HGBA1C in the last 72 hours. CBG:  Recent Labs Lab 06/04/17 2013 06/06/17 1235 06/06/17 1813  GLUCAP 98 57* 68   Lipid Profile: No results for input(s): CHOL, HDL, LDLCALC, TRIG, CHOLHDL, LDLDIRECT in the last 72 hours. Thyroid Function Tests: No results for input(s): TSH, T4TOTAL, FREET4, T3FREE, THYROIDAB in the last 72 hours. Anemia Panel: No results for input(s): VITAMINB12, FOLATE, FERRITIN, TIBC, IRON, RETICCTPCT in the last 72 hours. Urine analysis:    Component Value Date/Time   COLORURINE STRAW (A) 06/04/2017 2358   APPEARANCEUR CLEAR 06/04/2017 2358   LABSPEC 1.008 06/04/2017 2358   PHURINE 5.0 06/04/2017 2358   GLUCOSEU NEGATIVE 06/04/2017 2358   HGBUR SMALL (A) 06/04/2017 2358   BILIRUBINUR NEGATIVE 06/04/2017 2358   BILIRUBINUR small 07/29/2016 1103   KETONESUR NEGATIVE 06/04/2017 2358   PROTEINUR NEGATIVE 06/04/2017 2358   UROBILINOGEN 1.0 07/29/2016 1103   UROBILINOGEN 1.0 04/14/2015 1030   NITRITE NEGATIVE 06/04/2017 2358   LEUKOCYTESUR NEGATIVE 06/04/2017 2358   Sepsis Labs: (procalcitonin:4,lacticidven:4) )No results found for this or any previous visit (from the past 240 hour(s)).   Radiological Exams on Admission: Dg Chest 2 View  Result Date: 06/06/2017 CLINICAL DATA:  History of dementia with to hematoma is noted on head lying in bed not wanting to get up lying in urine and feces. EXAM: CHEST  2 VIEW COMPARISON:  04/27/2017 and 04/14/2015 FINDINGS: Lungs are adequately inflated without consolidation or effusion. Mild stable cardiomegaly. Minimal calcified plaque over the aortic arch.  Moderate degenerate change of the spine. IMPRESSION: No acute cardiopulmonary disease. Aortic Atherosclerosis (ICD10-I70.0). Electronically Signed   By: Elberta Fortis M.D.   On: 06/06/2017 13:06   Ct Head Wo Contrast  Result Date: 06/06/2017 CLINICAL DATA:  Altered level of consciousness. EXAM: CT HEAD WITHOUT CONTRAST TECHNIQUE:  Contiguous axial images were obtained from the base of the skull through the vertex without intravenous contrast. COMPARISON:  CT scan of June 04, 2017. FINDINGS: Brain: Mild chronic ischemic white matter disease is noted. Stable old left MCA infarction is noted. No mass effect or midline shift is noted. Ventricular size is within normal limits. There is no evidence of mass lesion, hemorrhage or acute infarction. Vascular: No hyperdense vessel or unexpected calcification. Skull: Normal. Negative for fracture or focal lesion. Sinuses/Orbits: No acute finding. Other: Stable mild bilateral frontal scalp hematomas are noted. IMPRESSION: Stable mild bilateral frontal scalp hematomas. Mild chronic ischemic white matter disease. Stable old left MCA infarction. No acute intracranial abnormality seen. Electronically Signed   By: Lupita Raider, M.D.   On: 06/06/2017 13:40   Ct Head Code Stroke Wo Contrast  Result Date: 06/04/2017 CLINICAL DATA:  Code stroke. Initial evaluation for acute left facial droop. Fall. EXAM: CT HEAD WITHOUT CONTRAST TECHNIQUE: Contiguous axial images were obtained from the base of the skull through the vertex without intravenous contrast. COMPARISON:  Prior CT from 05/28/2017. FINDINGS: Brain: Examination limited by positioning and motion. Age-related cerebral atrophy with chronic microvascular ischemic disease. Remote posterior left MCA territory infarct. Associated laminar necrosis, similar to previous. No acute intracranial hemorrhage. No evidence for acute large vessel territory infarct. No mass lesion or midline shift. No mass effect. No hydrocephalus. No  extra-axial fluid collection. Vascular: No hyperdense vessel. Scattered vascular calcifications noted within the carotid siphons. Skull: Multifocal frontal scalp contusions.  Calvarium intact. Sinuses/Orbits: Globes normal soft tissues within normal limits. Visualized paranasal sinuses are clear. Small bilateral mastoid effusions, left greater than right noted. Other: None. ASPECTS [facility] Stroke Program Early CT Score) - Ganglionic level infarction (caudate, lentiform nuclei, internal capsule, insula, M1-M3 cortex): 7 - Supraganglionic infarction (M4-M6 cortex): 3 Total score (0-10 with 10 being normal): 10 IMPRESSION: 1. No acute intracranial abnormality. 2. ASPECTS is 10 3. Multifocal soft tissue contusions about the forehead. Calvarium intact. 4. Stable atrophy with chronic microvascular ischemic disease. Remote left MCA territory infarct with associated laminar necrosis. Critical Value/emergent results were called by telephone at the time of interpretation on 06/04/2017 at 8:41 pm to Dr. Laurence Slate, who verbally acknowledged these results. Electronically Signed   By: Rise Mu M.D.   On: 06/04/2017 20:43     EKG: Independently reviewed.  Atrial fibrillation, QTC 44, T-wave inversion in V3-V5   Assessment/Plan Principal Problem:   Atrial fibrillation with RVR (HCC) Active Problems:   HTN (hypertension)   Chronic diastolic heart failure (HCC)   Long term (current) use of anticoagulants   Acute metabolic encephalopathy   Dementia with behavioral disturbance   Pure hypercholesterolemia   Stroke (HCC)   Fall   CKD (chronic kidney disease), stage III   Atrial fibrillation with RVR (HCC): pt has A fib with RVR with HR up to 140s per EDP. Hemodynamically stable. Triggering factor is not clear. Pending UA. No fever. CXR negative. CHA2DS2-VASc Score is 8, needs oral anticoagulation. Patient is on Xarelto at home.   -will admit to SDU as inpt -continue Cardizem gtt -trop x 3 -check  TSH -hold oral Xarelto due to hematoma and worsening mental status.  Acute metabolic encephalopathy: Etiology is not clear. Likely multifactorial etiology, including recent stroke, dementia, A. fib with RVR). Will need to r/o UTI. -f/u UA and urine culture -swallowing screen at bedside - Neurologic checks -hold oral meds  -IVF: NS 500 x 2 and D5-1/2 NS at 75 cc/h  HTN (hypertension): -on Cardizem -prn hydralazine IV  Chronic diastolic heart failure (HCC): 2-D echo on 8/60/18 showed EF of 55-60%. Patient does not have leg edema JVD. Patient is started on diuretics at home. -Check BNP  Dementia with behavioral disturbance: -hold donepezil until her mental status improves -prn haldol 1 mg q12h for agitation.  Pure hypercholesterolemia: -hold lipitor until her mental status improves  Stroke [facility]): pt has right facial droop that is consistent with her recent L MCA stroke. I agree with EDP, Dr. Juleen China that further workup for stroke will not change her management. Pt is alread on Xarelto. --hold lipitor and Xarelto until her mental status improves  Possible Fall and frontal hematoma: -need PT/OT when able to (not not ordered yet)  CKD (chronic kidney disease), stage III: stable. Baseline creatinine 1.0-1.3. Her creatinine is 1.3 and a BUN 21. -Follow-up renal function by BMP lab  Disposition: ED physician discussed with her daughter on the phone, who agreed to discharge patient to nursing home. SW has worked out a Higher education careers adviser to d/c pt to [facility]. I tried 4 times to contact her daughter without success. -will need palliative care to follow up in nursing home   DVT ppx: SCD Code Status: Full code (I could not reach her family, her previous CODE STATUS was full code. Will temporarily put Full Code now). This will need to be dicussed with her family in AM. Family Communication: None at bed side. Disposition Plan:  Anticipate discharge to SNF Consults called:  none Admission status:     SDU/inpation       Date of Service 06/06/2017    Lorretta Harp [facility] Pager [phone]  If 7PM-7AM, please contact night-coverage [email or web address] Password [facility] 06/06/2017, 7:11 PM

## 2017-06-06 NOTE — NC FL2 (Signed)
[place] MEDICAID FL2 LEVEL OF CARE SCREENING TOOL     IDENTIFICATION  Patient Name: [name] Birthdate: [birth year] Sex: female Admission Date (Current Location): 06/06/2017  [facility] and [place] Number:  Producer, television/film/video and Address:  [facility],  [address] [place] [address]      Provider Number: [id number]  Attending Physician Name and Address:  Abelino Derrick, *  Relative Name and Phone Number:       Current Level of Care: Hospital Recommended Level of Care: Skilled Nursing Facility Prior Approval Number:    Date Approved/Denied:   PASRR Number: [id number]  Discharge Plan: SNF    Current Diagnoses: Patient Active Problem List   Diagnosis Date Noted  . Goals of care, counseling/discussion   . Encounter for hospice care discussion   . Right sided weakness   . Somnolence   . Palliative care encounter   . Stroke (HCC) 04/27/2017  . Dehydration 04/19/2017  . Acute lower UTI 04/19/2017  . Atrial fibrillation with RVR (HCC) 04/19/2017  . AKI (acute kidney injury) (HCC) 04/19/2017  . Sepsis (HCC) 04/19/2017  . Pure hypercholesterolemia 08/27/2016  . Encounter for therapeutic drug monitoring 01/20/2014  . Dementia with behavioral disturbance 01/01/2014  . Leukopenia 12/30/2013  . Abnormal TSH 12/30/2013  . Hypokalemia 12/30/2013  . Acute encephalopathy 12/29/2013  . Atrial fibrillation with slow ventricular response (HCC) 12/29/2013  . Thrombocytopenia, unspecified (HCC) 12/29/2013  . Bradycardia 12/29/2013  . Arshia Rondon term (current) use of anticoagulants 01/24/2011  . Possible CAD - abnl myoview 2011, managed conservatively 01/17/2011  . PAD (peripheral artery disease) (HCC) 01/17/2011  . Chronic diastolic heart failure (HCC) 07/23/2010  . ANEMIA 06/19/2009  . HTN (hypertension) 06/02/2007    Orientation RESPIRATION BLADDER Height & Weight     Self  O2 Nasal Cannula  Incontinent Weight: 148 lb (67.1 kg) Height:    (167.6 cm)  BEHAVIORAL SYMPTOMS/MOOD NEUROLOGICAL BOWEL NUTRITION STATUS      Incontinent Diet DYS1  AMBULATORY STATUS COMMUNICATION OF NEEDS Skin   Total Verbally Normal                       Personal Care Assistance Level of Assistance  Bathing, Feeding, Dressing    Bathing Assistance: Maximum assistance Feeding Assistance: Maximum assistance  Dressing Assistance: Maximum assistance   Functional Limitations Info             SPECIAL CARE FACTORS FREQUENCY  PT (By licensed PT), OT (By licensed OT)     PT Frequency: 5 OT Frequency: 5            Contractures      Additional Factors Info  Code Status Code Status Info: prior Allergies Info: NKA            Current Medications (06/06/2017):  This is the current hospital active medication list No current facility-administered medications for this encounter.    Current Outpatient Prescriptions  Medication Sig Dispense Refill  . acetaminophen (TYLENOL) 650 MG CR tablet Take 650 mg by mouth every 4 (four) hours as needed (for pain).    Marland Kitchen atorvastatin (LIPITOR) 80 MG tablet TAKE 1 TABLET BY MOUTH DAILY AT 6 PM. (Patient taking differently: Take 80 mg by mouth every evening. ) 30 tablet 5  . citalopram (CELEXA) 10 MG tablet Take 10 mg by mouth daily.    . cyanocobalamin 500 MCG tablet Take 500 mcg by mouth daily.     Marland Kitchen donepezil (  ARICEPT) 10 MG tablet Take 1 tablet (10 mg total) by mouth at bedtime. 30 tablet 11  . haloperidol (HALDOL) 2 MG/ML solution Take 1 mL (2 mg total) by mouth every 6 (six) hours as needed for agitation. (Patient not taking: Reported on 06/04/2017)  0  . metoprolol tartrate (LOPRESSOR) 25 MG tablet Take 0.5 tablets (12.5 mg total) by mouth 2 (two) times daily. 60 tablet 0  . Rivaroxaban (XARELTO) 15 MG TABS tablet Take 1 tablet (15 mg total) by mouth daily with supper. 42 tablet   . sodium chloride (OCEAN) 0.65 % SOLN nasal spray Place 1 spray into both nostrils 3 (three) times daily as needed for  congestion. 104 mL 0  . traMADol (ULTRAM) 50 MG tablet Take 50 mg by mouth every 12 (twelve) hours as needed (for pain not first relieved by Tylenol).       Discharge Medications: Please see discharge summary for a list of discharge medications.  Relevant Imaging Results:  Relevant Lab Results:   Additional Information SS#: [id number]  Donnie Coffin, LCSW

## 2017-06-07 ENCOUNTER — Inpatient Hospital Stay (HOSPITAL_COMMUNITY)
Admit: 2017-06-07 | Discharge: 2017-06-07 | Disposition: A | Discharge status: Home / Self Care | Payer: Medicare Other | Attending: Internal Medicine | Admitting: Internal Medicine

## 2017-06-07 DIAGNOSIS — I4891 Unspecified atrial fibrillation: Secondary | ICD-10-CM

## 2017-06-07 DIAGNOSIS — G9341 Metabolic encephalopathy: Secondary | ICD-10-CM

## 2017-06-07 LAB — GLUCOSE, CAPILLARY
GLUCOSE-CAPILLARY: 97 mg/dL (ref 65–99)
GLUCOSE-CAPILLARY: 105 mg/dL — AB (ref 65–99)
GLUCOSE-CAPILLARY: 97 mg/dL (ref 65–99)
Glucose-Capillary: 88 mg/dL (ref 65–99)
Glucose-Capillary: 88 mg/dL (ref 65–99)

## 2017-06-07 LAB — CBC
HCT: 34.4 % — ABNORMAL LOW (ref 36.0–46.0)
Hemoglobin: 11.2 g/dL — ABNORMAL LOW (ref 12.0–15.0)
MCH: 29.3 pg (ref 26.0–34.0)
MCHC: 32.6 g/dL (ref 30.0–36.0)
MCV: 90.1 fL (ref 78.0–100.0)
PLATELETS: 139 10*3/uL — AB (ref 150–400)
RBC: 3.82 MIL/uL — AB (ref 3.87–5.11)
RDW: 14.9 % (ref 11.5–15.5)
WBC: 3.4 10*3/uL — ABNORMAL LOW (ref 4.0–10.5)

## 2017-06-07 LAB — BASIC METABOLIC PANEL
Anion gap: 6 (ref 5–15)
BUN: 16 mg/dL (ref 6–20)
CHLORIDE: 106 mmol/L (ref 101–111)
CO2: 28 mmol/L (ref 22–32)
Calcium: 8.8 mg/dL — ABNORMAL LOW (ref 8.9–10.3)
Creatinine, Ser: 1.06 mg/dL — ABNORMAL HIGH (ref 0.44–1.00)
GFR calc Af Amer: 55 mL/min — ABNORMAL LOW (ref >60)
GFR calc non Af Amer: 48 mL/min — ABNORMAL LOW (ref >60)
GLUCOSE: 91 mg/dL (ref 65–99)
Potassium: 3.8 mmol/L (ref 3.5–5.1)
Sodium: 140 mmol/L (ref 135–145)

## 2017-06-07 LAB — TROPONIN I
Troponin I: 0.04 ng/mL (ref <0.03)
Troponin I: 0.04 ng/mL (ref <0.03)

## 2017-06-07 LAB — URINALYSIS, ROUTINE W REFLEX MICROSCOPIC
Bilirubin Urine: NEGATIVE
Glucose, UA: NEGATIVE mg/dL
Ketones, ur: NEGATIVE mg/dL
Leukocytes, UA: NEGATIVE
Nitrite: NEGATIVE
PROTEIN: NEGATIVE mg/dL
Specific Gravity, Urine: 1.01 (ref 1.005–1.030)
pH: 6 (ref 5.0–8.0)

## 2017-06-07 LAB — MRSA PCR SCREENING: MRSA by PCR: NEGATIVE

## 2017-06-07 LAB — TSH: TSH: 0.542 u[IU]/mL (ref 0.350–4.500)

## 2017-06-07 MED ORDER — DILTIAZEM LOAD VIA INFUSION
10.0000 mg | Freq: Once | INTRAVENOUS | Status: AC
Start: 2017-06-07 — End: 2017-06-07
  Administered 2017-06-07: 10 mg via INTRAVENOUS
  Filled 2017-06-07: qty 10

## 2017-06-07 MED ORDER — DILTIAZEM HCL-DEXTROSE 100-5 MG/100ML-% IV SOLN (PREMIX)
5.0000 mg/h | INTRAVENOUS | Status: DC
  Administered 2017-06-07: 5 mg/h via INTRAVENOUS
  Administered 2017-06-08: 15 mg/h via INTRAVENOUS
  Administered 2017-06-08: 10 mg/h via INTRAVENOUS
  Administered 2017-06-08: 15 mg/h via INTRAVENOUS
  Administered 2017-06-09: 12.5 mg/h via INTRAVENOUS
  Administered 2017-06-09: 15 mg/h via INTRAVENOUS
  Administered 2017-06-09: 10 mg/h via INTRAVENOUS
  Filled 2017-06-07 (×7): qty 100

## 2017-06-07 NOTE — Progress Notes (Signed)
Unable to complete admission the,  pt is not able to answer questions at this time.

## 2017-06-07 NOTE — Progress Notes (Signed)
  Pt alert to self from home, tx from the ED. No family present at this time, ecchymosis noted to frontal lobe. Pt's bp on admit is 145/111,123,19, 100% at 0030, made Bonnie Mcgregor NP aware order given for Cardizem bolus, bp rechecked at 0100 134/87,103,24,99%. I will continue to monitor.

## 2017-06-07 NOTE — Progress Notes (Signed)
Patient ID: [name], female   DOB: 28-May-1933, 83 y.o.   MRN: [id number]                                                                PROGRESS NOTE                                                                                                                                                                                                             Patient Demographics:    [name], is a 83 y.o. female, DOB - 20-Jan-1933, WUJ:[id number]  Admit date - 06/06/2017   Admitting Physician Lorretta Harp, MD  Outpatient Primary MD for the patient is System, Provider Not In  LOS - 1  Outpatient Specialists:    Chief Complaint  Patient presents with  . Weakness       Brief Narrative   83 y.o. female with medical history significant of hypertension, hyperlipidemia, PVD, dCHF, atrial fibrillation on xarelto, stroke, CAD, dementia, SKD-III, who presents with generalized weakness, worsening mental status and possible fall.   Patient has dementia (not sure about the baseline mental status), is unable to provide accurate medical history, therefore, most of the history is obtained by discussing the case with ED physician, per EMS report, and with the nursing staff. I have tried 4 times to contact her daughter without success.  Patient was recently hospitalized from 8/5-8/9 for possible MCA stroke. Pt was unable to do MRI due to patient not being able to cooperate, but repeated CT showed loss of gray-white differentiation along the left posterior insular ribbon is concerning for progressive left MCA territory infarct. She is on Xarelto for A fib.   Per reports, today pt has generalized weakness, worsening mental status and is more confused. She was found in her feces and urine. Per EDP, Dr. Marylen Ponto note, pt was isoriented to time and place, but otherwise seems like her baseline. Dr. Juleen China did not feel repeat hospitalization or testing of much utility at this time. Dr. Juleen China discussed with her  daughter [name] and informed of plan to discharge back to facility with plans of palliative care follow-up. EDP, Dr. Corlis Leak also discussed with her daughter on the phone and made a plan to discharge patient to nursing home. SW has worked out a Higher education careers adviser to d/c  pt to [facility], but pt was found to A fib with RVR with HR up to 140, therefor pt needs to be admitted to hospital.   When I patient in ED, she is confused and not oriented 3. She is not following any commands. No active nausea, vomiting, diarrhea, respiratory distress, cough noted. She moves all extremities. She has right facial droop (not sure if this is new). She has 2 hematomas in frontal head.  ED Course: pt was found to have WBC 2.9, lactic acid 1.65, negative troponin, stable renal function, temperature normal, tachycardia, tachypnea, oxygen saturation 100% on room air, blood pressure 166/122. CT head is negative for acute intracranial abnormalities. Chest x-ray negative. Pending urinalysis. Patient is admitted to stepdown as inpatient. Cardizem drip was started.    Subjective:    [name] today is not very responsive,  She will not open her eyes to direct command but when I tickle her feet moves her arms and legs, and smiles.  Pt is not able to verbalize to me anything this am.  Attempted to call daughter x2 to find out her baseline status.    No obvious complaints this am. History limited by dementia.     Assessment  & Plan :    Principal Problem:   Atrial fibrillation with RVR (HCC) Active Problems:   HTN (hypertension)   Chronic diastolic heart failure (HCC)   Long term (current) use of anticoagulants   Acute metabolic encephalopathy   Dementia with behavioral disturbance   Pure hypercholesterolemia   Stroke [facility])   Fall   CKD (chronic kidney disease), stage III   AMS  ? Dementia vs metabolic encephalopathy MRI brain   Atrial fibrillation with RVR (HCC): pt has A fib with RVR with HR up to 140s per EDP.  Hemodynamically stable. Triggering factor is not clear. Pending UA. No fever. CXR negative. CHA2DS2-VASc Score is 8, needs oral anticoagulation. Patient is on Xarelto at home.  continue Cardizem gtt hold oral Xarelto due to hematoma and worsening mental status.  Acute metabolic encephalopathy: Etiology is not clear. Likely multifactorial etiology, including recent stroke, dementia, A. fib with RVR). Neurologic checks Pt unable to take oral meds  Hydrate with d5 1/2   HTN (hypertension): on Cardizem prn hydralazine IV  Chronic diastolic heart failure (HCC): 2-D echo on 8/60/18 showed EF of 55-60%. Patient does not have leg edema JVD. Patient is started on diuretics at home.   Dementia with behavioral disturbance: -hold donepezil until her mental status improves -prn haldol 1 mg q12h for agitation.  Pure hypercholesterolemia: -hold lipitor until her mental status improves  Stroke [facility]): pt has right facial droop that is consistent with her recent L MCA stroke. I agree with EDP, Dr. Juleen China that further workup for stroke will not change her management. Pt is alread on Xarelto. --hold lipitor and Xarelto until her mental status improves  Possible Fall and frontal hematoma: -need PT/OT when able to (not not ordered yet)  CKD (chronic kidney disease), stage III: stable. Baseline creatinine 1.0-1.3. Her creatinine is 1.3 and a BUN 21. -Follow-up renal function by BMP lab  Disposition: ED physician discussed with her daughter on the phone, who agreed to discharge patient to nursing home. SW has worked out a Higher education careers adviser to d/c pt to [facility]. Prior physician tried 4 times to contact her daughter without success. I have tried 8 times to contact daughter and not been successful -will need palliative care to follow up in nursing home  DVT ppx: SCD Code Status: Full code (I could not reach her family, her previous CODE STATUS was full code. Will temporarily put Full Code now).    Family Communication: None at bed side.  Attempted to contact daughter as above Disposition Plan:  Anticipate discharge to SNF Consults called:  none Admission status:    SDU/inpation          Lab Results  Component Value Date   PLT 139 (L) 06/07/2017      Anti-infectives    None        Objective:   Vitals:   06/07/17 0530 06/07/17 0600 06/07/17 0630 06/07/17 0700  BP: 117/81 106/74 96/65 107/71  Pulse: 87 95 85 87  Resp: 20 15 18 19   Temp:      TempSrc:      SpO2: 99% 98% 98% 98%  Weight:      Height:        Wt Readings from Last 3 Encounters:  06/07/17 64.9 kg (143 lb 1.3 oz)  06/04/17 67.4 kg (148 lb 9.4 oz)  05/28/17 68.5 kg (151 lb)     Intake/Output Summary (Last 24 hours) at 06/07/17 0811 Last data filed at 06/06/17 1815  Gross per 24 hour  Intake             1000 ml  Output                0 ml  Net             1000 ml     Physical Exam  Awake Alert, Oriented X 0 (doesn't respond to voice), No new F.N deficits, Normal affect [place].AT,PERRAL, slight facial droop ? Old Supple Neck,No JVD, No cervical lymphadenopathy appriciated.  Symmetrical Chest wall movement, Good air movement bilaterally, CTAB Irr, irr s1, s2, 1/6 sem apex +ve B.Sounds, Abd Soft, No tenderness, No organomegaly appriciated, No rebound - guarding or rigidity. No Cyanosis, Clubbing or edema, No new Rash or bruise      Data Review:    CBC  Recent Labs Lab 06/04/17 2017 06/04/17 2028 06/06/17 1223 06/07/17 0351  WBC 3.0*  --  2.9* 3.4*  HGB 13.0 13.6 12.6 11.2*  HCT 41.3 40.0 38.8 34.4*  PLT 121*  --  141* 139*  MCV 91.2  --  88.6 90.1  MCH 28.7  --  28.8 29.3  MCHC 31.5  --  32.5 32.6  RDW 14.4  --  14.5 14.9  LYMPHSABS 0.8  --   --   --   MONOABS 0.4  --   --   --   EOSABS 0.0  --   --   --   BASOSABS 0.0  --   --   --     Chemistries   Recent Labs Lab 06/04/17 2017 06/04/17 2028 06/06/17 1224 06/07/17 0351  NA 135 139 140 140  K 4.8 4.7 4.1 3.8  CL  102 103 103 106  CO2 24  --  28 28  GLUCOSE 95 105* 74 91  BUN 22* 26* 21* 16  CREATININE 1.44* 1.40* 1.30* 1.06*  CALCIUM 9.1  --  9.4 8.8*  AST 41  --  39  --   ALT 22  --  27  --   ALKPHOS 78  --  82  --   BILITOT 0.9  --  1.6*  --    ------------------------------------------------------------------------------------------------------------------ No results for input(s): CHOL, HDL, LDLCALC, TRIG, CHOLHDL, LDLDIRECT in the last 72 hours.  Lab Results  Component Value Date   HGBA1C 5.3 04/28/2017   ------------------------------------------------------------------------------------------------------------------  Recent Labs  06/07/17 0351  TSH 0.542   ------------------------------------------------------------------------------------------------------------------ No results for input(s): VITAMINB12, FOLATE, FERRITIN, TIBC, IRON, RETICCTPCT in the last 72 hours.  Coagulation profile  Recent Labs Lab 06/06/17 1223  INR 1.27    No results for input(s): DDIMER in the last 72 hours.  Cardiac Enzymes  Recent Labs Lab 06/06/17 2147 06/07/17 0143  TROPONINI 0.04* 0.04*   ------------------------------------------------------------------------------------------------------------------    Component Value Date/Time   BNP 276.5 (H) 06/06/2017 2147    Inpatient Medications  Scheduled Meds: Continuous Infusions: . dextrose 5 % and 0.45% NaCl 75 mL/hr at 06/06/17 2243   PRN Meds:.acetaminophen, haloperidol lactate, ondansetron (ZOFRAN) IV  Micro Results Recent Results (from the past 240 hour(s))  MRSA PCR Screening     Status: None   Collection Time: 06/07/17 12:23 AM  Result Value Ref Range Status   MRSA by PCR NEGATIVE NEGATIVE Final    Comment:        The GeneXpert MRSA Assay (FDA approved for NASAL specimens only), is one component of a comprehensive MRSA colonization surveillance program. It is not intended to diagnose MRSA infection nor to guide  or monitor treatment for MRSA infections.     Radiology Reports Dg Chest 2 View  Result Date: 06/06/2017 CLINICAL DATA:  History of dementia with to hematoma is noted on head lying in bed not wanting to get up lying in urine and feces. EXAM: CHEST  2 VIEW COMPARISON:  04/27/2017 and 04/14/2015 FINDINGS: Lungs are adequately inflated without consolidation or effusion. Mild stable cardiomegaly. Minimal calcified plaque over the aortic arch. Moderate degenerate change of the spine. IMPRESSION: No acute cardiopulmonary disease. Aortic Atherosclerosis (ICD10-I70.0). Electronically Signed   By: Elberta Fortis M.D.   On: 06/06/2017 13:06   Ct Head Wo Contrast  Result Date: 06/06/2017 CLINICAL DATA:  Altered level of consciousness. EXAM: CT HEAD WITHOUT CONTRAST TECHNIQUE: Contiguous axial images were obtained from the base of the skull through the vertex without intravenous contrast. COMPARISON:  CT scan of June 04, 2017. FINDINGS: Brain: Mild chronic ischemic white matter disease is noted. Stable old left MCA infarction is noted. No mass effect or midline shift is noted. Ventricular size is within normal limits. There is no evidence of mass lesion, hemorrhage or acute infarction. Vascular: No hyperdense vessel or unexpected calcification. Skull: Normal. Negative for fracture or focal lesion. Sinuses/Orbits: No acute finding. Other: Stable mild bilateral frontal scalp hematomas are noted. IMPRESSION: Stable mild bilateral frontal scalp hematomas. Mild chronic ischemic white matter disease. Stable old left MCA infarction. No acute intracranial abnormality seen. Electronically Signed   By: Lupita Raider, M.D.   On: 06/06/2017 13:40   Ct Head Wo Contrast  Result Date: 05/28/2017 CLINICAL DATA:  Fall. Posterior neck pain. Head trauma, minor, patient on anti coagulation. EXAM: CT HEAD WITHOUT CONTRAST CT CERVICAL SPINE WITHOUT CONTRAST TECHNIQUE: Multidetector CT imaging of the head and cervical spine was  performed following the standard protocol without intravenous contrast. Multiplanar CT image reconstructions of the cervical spine were also generated. COMPARISON:  CT head without contrast 04/28/2017. FINDINGS: CT HEAD FINDINGS Brain: Moderate atrophy and white matter changes are stable. Previous left parietal lobe infarct is stable. Previously suspected posterior left insular infarct is not present. Remote ischemic changes of the basal ganglia are stable. The brainstem and cerebellum are normal. Ventricles are proportionate to the degree of atrophy. No significant extra-axial fluid collection  is present. Vascular: Atherosclerotic calcifications are present within the cavernous internal carotid artery is bilaterally. There is no hyperdense vessel. Skull: A prominent right frontal scalp laceration and hematoma is present. There is no underlying fracture. Hyperostosis is again noted. Sinuses/Orbits: The paranasal sinuses and mastoid air cells are clear. CT CERVICAL SPINE FINDINGS Alignment: Slight anterolisthesis at C2-3 is stable. There straightening of the normal cervical lordosis. Grade 1 anterolisthesis at C7-T1 is stable. AP alignment is unchanged. Skull base and vertebrae: The craniocervical junction is normal. Degenerative changes at C1-2 or stable. A remote left T7 laminar fracture is stable. No acute fractures are present. Soft tissues and spinal canal: Atherosclerotic calcifications are present at carotid bifurcations bilaterally. Right thyroid goiter is stable. No significant cervical adenopathy is present. Disc levels: Left-sided osseous foraminal narrowing is most pronounced at C4-5 and C5-6. More moderate disease is present at C6-7 on the left. Right-sided foramina are patent. Upper chest: The lung apices are clear. IMPRESSION: 1. Stable remote left C7 lamina fracture. 2. No acute cervical spine fracture. 3. Large right frontal scalp hematoma and laceration without underlying fracture. 4. Stable atrophy  and white matter disease. 5. Remote left parietal lobe infarct is stable. Electronically Signed   By: Marin Roberts M.D.   On: 05/28/2017 15:21   Ct Cervical Spine Wo Contrast  Result Date: 05/28/2017 CLINICAL DATA:  Fall. Posterior neck pain. Head trauma, minor, patient on anti coagulation. EXAM: CT HEAD WITHOUT CONTRAST CT CERVICAL SPINE WITHOUT CONTRAST TECHNIQUE: Multidetector CT imaging of the head and cervical spine was performed following the standard protocol without intravenous contrast. Multiplanar CT image reconstructions of the cervical spine were also generated. COMPARISON:  CT head without contrast 04/28/2017. FINDINGS: CT HEAD FINDINGS Brain: Moderate atrophy and white matter changes are stable. Previous left parietal lobe infarct is stable. Previously suspected posterior left insular infarct is not present. Remote ischemic changes of the basal ganglia are stable. The brainstem and cerebellum are normal. Ventricles are proportionate to the degree of atrophy. No significant extra-axial fluid collection is present. Vascular: Atherosclerotic calcifications are present within the cavernous internal carotid artery is bilaterally. There is no hyperdense vessel. Skull: A prominent right frontal scalp laceration and hematoma is present. There is no underlying fracture. Hyperostosis is again noted. Sinuses/Orbits: The paranasal sinuses and mastoid air cells are clear. CT CERVICAL SPINE FINDINGS Alignment: Slight anterolisthesis at C2-3 is stable. There straightening of the normal cervical lordosis. Grade 1 anterolisthesis at C7-T1 is stable. AP alignment is unchanged. Skull base and vertebrae: The craniocervical junction is normal. Degenerative changes at C1-2 or stable. A remote left T7 laminar fracture is stable. No acute fractures are present. Soft tissues and spinal canal: Atherosclerotic calcifications are present at carotid bifurcations bilaterally. Right thyroid goiter is stable. No  significant cervical adenopathy is present. Disc levels: Left-sided osseous foraminal narrowing is most pronounced at C4-5 and C5-6. More moderate disease is present at C6-7 on the left. Right-sided foramina are patent. Upper chest: The lung apices are clear. IMPRESSION: 1. Stable remote left C7 lamina fracture. 2. No acute cervical spine fracture. 3. Large right frontal scalp hematoma and laceration without underlying fracture. 4. Stable atrophy and white matter disease. 5. Remote left parietal lobe infarct is stable. Electronically Signed   By: Marin Roberts M.D.   On: 05/28/2017 15:21   Ct Head Code Stroke Wo Contrast  Result Date: 06/04/2017 CLINICAL DATA:  Code stroke. Initial evaluation for acute left facial droop. Fall. EXAM: CT HEAD WITHOUT CONTRAST TECHNIQUE:  Contiguous axial images were obtained from the base of the skull through the vertex without intravenous contrast. COMPARISON:  Prior CT from 05/28/2017. FINDINGS: Brain: Examination limited by positioning and motion. Age-related cerebral atrophy with chronic microvascular ischemic disease. Remote posterior left MCA territory infarct. Associated laminar necrosis, similar to previous. No acute intracranial hemorrhage. No evidence for acute large vessel territory infarct. No mass lesion or midline shift. No mass effect. No hydrocephalus. No extra-axial fluid collection. Vascular: No hyperdense vessel. Scattered vascular calcifications noted within the carotid siphons. Skull: Multifocal frontal scalp contusions.  Calvarium intact. Sinuses/Orbits: Globes normal soft tissues within normal limits. Visualized paranasal sinuses are clear. Small bilateral mastoid effusions, left greater than right noted. Other: None. ASPECTS [facility] Stroke Program Early CT Score) - Ganglionic level infarction (caudate, lentiform nuclei, internal capsule, insula, M1-M3 cortex): 7 - Supraganglionic infarction (M4-M6 cortex): 3 Total score (0-10 with 10 being normal):  10 IMPRESSION: 1. No acute intracranial abnormality. 2. ASPECTS is 10 3. Multifocal soft tissue contusions about the forehead. Calvarium intact. 4. Stable atrophy with chronic microvascular ischemic disease. Remote left MCA territory infarct with associated laminar necrosis. Critical Value/emergent results were called by telephone at the time of interpretation on 06/04/2017 at 8:41 pm to Dr. Laurence Slate, who verbally acknowledged these results. Electronically Signed   By: Rise Mu M.D.   On: 06/04/2017 20:43    Time Spent in minutes  30   Pearson Grippe M.D on 06/07/2017 at 8:11 AM  Between 7am to 7pm - Pager - [phone]  After 7pm go to [email or web address] - password [facility]  [facility] -  Office  [phone]

## 2017-06-08 LAB — COMPREHENSIVE METABOLIC PANEL
ALBUMIN: 2.3 g/dL — AB (ref 3.5–5.0)
ALT: 18 U/L (ref 14–54)
ANION GAP: 7 (ref 5–15)
AST: 29 U/L (ref 15–41)
Alkaline Phosphatase: 63 U/L (ref 38–126)
BILIRUBIN TOTAL: 1.8 mg/dL — AB (ref 0.3–1.2)
BUN: 13 mg/dL (ref 6–20)
CO2: 24 mmol/L (ref 22–32)
Calcium: 8.3 mg/dL — ABNORMAL LOW (ref 8.9–10.3)
Chloride: 105 mmol/L (ref 101–111)
Creatinine, Ser: 0.94 mg/dL (ref 0.44–1.00)
GFR calc Af Amer: >60 mL/min (ref >60)
GFR calc non Af Amer: 55 mL/min — ABNORMAL LOW (ref >60)
GLUCOSE: 90 mg/dL (ref 65–99)
POTASSIUM: 3.9 mmol/L (ref 3.5–5.1)
Sodium: 136 mmol/L (ref 135–145)
TOTAL PROTEIN: 5.8 g/dL — AB (ref 6.5–8.1)

## 2017-06-08 LAB — CBC
HEMATOCRIT: 34.5 % — AB (ref 36.0–46.0)
Hemoglobin: 11.1 g/dL — ABNORMAL LOW (ref 12.0–15.0)
MCH: 28.6 pg (ref 26.0–34.0)
MCHC: 32.2 g/dL (ref 30.0–36.0)
MCV: 88.9 fL (ref 78.0–100.0)
Platelets: 130 10*3/uL — ABNORMAL LOW (ref 150–400)
RBC: 3.88 MIL/uL (ref 3.87–5.11)
RDW: 14.8 % (ref 11.5–15.5)
WBC: 3.3 10*3/uL — ABNORMAL LOW (ref 4.0–10.5)

## 2017-06-08 LAB — GLUCOSE, CAPILLARY
GLUCOSE-CAPILLARY: 97 mg/dL (ref 65–99)
GLUCOSE-CAPILLARY: 143 mg/dL — AB (ref 65–99)
GLUCOSE-CAPILLARY: 117 mg/dL — AB (ref 65–99)
Glucose-Capillary: 98 mg/dL (ref 65–99)

## 2017-06-08 MED ORDER — ENOXAPARIN SODIUM 80 MG/0.8ML ~~LOC~~ SOLN
1.0000 mg/kg | Freq: Two times a day (BID) | SUBCUTANEOUS | Status: DC
  Administered 2017-06-08 – 2017-06-09 (×4): 65 mg via SUBCUTANEOUS
  Filled 2017-06-08 (×5): qty 0.65

## 2017-06-08 MED ORDER — METOPROLOL TARTRATE 12.5 MG HALF TABLET: 12.5000 mg | ORAL_TABLET | Freq: Two times a day (BID) | ORAL | Status: DC

## 2017-06-08 MED ORDER — RIVAROXABAN 15 MG PO TABS: 15.0000 mg | ORAL_TABLET | Freq: Every day | ORAL | Status: DC

## 2017-06-08 MED ORDER — APIXABAN 5 MG PO TABS: 5.0000 mg | ORAL_TABLET | Freq: Two times a day (BID) | ORAL | Status: DC

## 2017-06-08 MED ORDER — PRO-STAT SUGAR FREE PO LIQD: 30.0000 mL | Freq: Two times a day (BID) | ORAL | Status: DC

## 2017-06-08 MED ORDER — METOPROLOL TARTRATE 5 MG/5ML IV SOLN
5.0000 mg | Freq: Once | INTRAVENOUS | Status: AC
  Administered 2017-06-08: 5 mg via INTRAVENOUS

## 2017-06-08 MED ORDER — METOPROLOL TARTRATE 5 MG/5ML IV SOLN
2.5000 mg | Freq: Two times a day (BID) | INTRAVENOUS | Status: DC
  Administered 2017-06-08: 2.5 mg via INTRAVENOUS
  Filled 2017-06-08: qty 5

## 2017-06-08 MED ORDER — ATORVASTATIN CALCIUM 40 MG PO TABS: 80.0000 mg | ORAL_TABLET | Freq: Every day | ORAL | Status: DC

## 2017-06-08 MED ORDER — METOPROLOL TARTRATE 5 MG/5ML IV SOLN
2.5000 mg | Freq: Four times a day (QID) | INTRAVENOUS | Status: DC
  Administered 2017-06-09 (×2): 2.5 mg via INTRAVENOUS
  Filled 2017-06-08 (×3): qty 5

## 2017-06-08 NOTE — Evaluation (Signed)
Clinical/Bedside Swallow Evaluation Patient Details  Name: [name] MRN: [id number] Date of Birth: 04-08-1933  Today's Date: 06/08/2017 Time: SLP Start Time (ACUTE ONLY): 1431 SLP Stop Time (ACUTE ONLY): 1447 SLP Time Calculation (min) (ACUTE ONLY): 16 min  Past Medical History:  Past Medical History:  Diagnosis Date  . CAD (coronary artery disease)    a. Possible dx -  Lexiscan myoview (9/11): EF 76%, normal wall motion, possible small anterior MI with mild peri-infarct ischemia but cannot rule out breast attenuation, managed conservatively.  . Cerebrovascular accident, embolic (HCC)    left frontal  . Chronic atrial fibrillation (HCC)    a. Chronic. Has had cardioembolic right renal infarct in 12/06 and cardioembolic event to the right arm requiring right brachial embolectomy. Both events occurred while subtherapeutic on coumadin. b. Slow VR 12/2013.  . Diastolic HF (heart failure) (HCC)    Echo (7/11): EF 60-65%, moderate LVH, severe diastolic dysfunction.   . Dyslipidemia   . HTN (hypertension)    a. Resistant HTN. Patient states that this has been poorly controlled for years.   . Iron deficiency anemia   . Memory loss   . Obese   . PAD (peripheral artery disease) (HCC)    a. peripheral arterial dopplers (5/12) with right mid popliteal artery occlusion with reconstitution of arteries below the knees by collaterals. Only mild reduction in ABI on the right (not tissue-threatening).   Past Surgical History:  Past Surgical History:  Procedure Laterality Date  . EMBOLECTOMY     right brachial embolectomy   HPI:  [name] a 32 y.o.femalewith medical history significant of hypertension, hyperlipidemia, PVD, dCHF, atrial fibrillation on xarelto,stroke 04/2017), CAD, dementia, SKD-III, who presents with generalized weakness, worsening mental status and possible fall. Pt found to have A-fib with RVR.  CT head is negative for acute intracranial abnormalities. MRI Old posterior  left MCA distribution infarct without any clear area of acute ischemia. Initial BSE 04/28/17 recommended NPO. On 04/30/17 Dys 1, thin recommended.   Assessment / Plan / Recommendation Clinical Impression  Pt exhibits a cognitive based dysphagia marked by characteristics of dementia (limited oral cavity openint). Pt accepted one straw sip thin water and two bites pudding and refused additional attempts. Pt internally distracted by her thoughts. Pt with nonsensical verbalizations with look of concern and frustration. No cough, throat clear or wet vocal quality. Recommend initiating Dys 1 texture, thin liquids, straws allowed, pills crushed, full supervision and assist, minimize environmental distractions and check oral cavity for pocketed food. ST will follow pt.   SLP Visit Diagnosis: Dysphagia, oral phase (R13.11)    Aspiration Risk   (mild-mod)    Diet Recommendation Dysphagia 1 (Puree);Thin liquid   Liquid Administration via: Straw;Cup Medication Administration: Crushed with puree Supervision: Staff to assist with self feeding;Full supervision/cueing for compensatory strategies Compensations: Minimize environmental distractions;Slow rate;Small sips/bites;Lingual sweep for clearance of pocketing Postural Changes: Seated upright at 90 degrees    Other  Recommendations Oral Care Recommendations: Oral care BID   Follow up Recommendations  (TBD)      Frequency and Duration min 2x/week  2 weeks       Prognosis Prognosis for Safe Diet Advancement: Fair Barriers to Reach Goals: Cognitive deficits      Swallow Study   General HPI: [name] a 23 y.o.femalewith medical history significant of hypertension, hyperlipidemia, PVD, dCHF, atrial fibrillation on xarelto,stroke 04/2017), CAD, dementia, SKD-III, who presents with generalized weakness, worsening mental status and possible fall. Pt found  to have A-fib with RVR.  CT head is negative for acute intracranial abnormalities. MRI Old  posterior left MCA distribution infarct without any clear area of acute ischemia. Initial BSE 04/28/17 recommended NPO. On 04/30/17 Dys 1, thin recommended. Type of Study: Bedside Swallow Evaluation Previous Swallow Assessment: see HPI Diet Prior to this Study: Thin liquids;Dysphagia 1 (puree) Temperature Spikes Noted: No Respiratory Status: Room air History of Recent Intubation: No Behavior/Cognition: Alert;Requires cueing Oral Cavity Assessment:  (unable to fully view) Oral Care Completed by SLP: No Oral Cavity - Dentition: Edentulous (one tooth) Vision: Functional for self-feeding Self-Feeding Abilities: Needs assist;Needs set up Patient Positioning: Upright in bed Baseline Vocal Quality: Normal Volitional Cough: Cognitively unable to elicit Volitional Swallow: Unable to elicit    Oral/Motor/Sensory Function Overall Oral Motor/Sensory Function:  (unable to participate)   Ice Chips Ice chips: Not tested   Thin Liquid Thin Liquid: Within functional limits Presentation: Straw    Nectar Thick Nectar Thick Liquid: Not tested   Honey Thick Honey Thick Liquid: Not tested   Puree Puree: Impaired Oral Phase Impairments: Reduced labial seal Oral Phase Functional Implications:  (labial residue) Pharyngeal Phase Impairments: Suspected delayed Swallow   Solid   GO   Solid: Not tested        Royce Macadamia 06/08/2017,3:09 PM   Breck Coons Lonell Face.Ed [employer] [phone]

## 2017-06-08 NOTE — Progress Notes (Addendum)
ANTICOAGULATION CONSULT NOTE - Initial Consult  Pharmacy Consult for Xarelto Indication: atrial fibrillation  No Known Allergies  Patient Measurements: Height:  (167.6 cm) Weight: 143 lb 1.3 oz (64.9 kg) IBW/kg (Calculated) : 59.3  Vital Signs: Temp: 97.6 F (36.4 C) (09/16 0800) Temp Source: Oral (09/16 0800) BP: 143/78 (09/16 0600) Pulse Rate: 104 (09/16 0400)  Labs:  Recent Labs  06/06/17 1223 06/06/17 1224 06/06/17 2147 06/07/17 0143 06/07/17 0351 06/07/17 0730 06/08/17 0329  HGB 12.6  --   --   --  11.2*  --  11.1*  HCT 38.8  --   --   --  34.4*  --  34.5*  PLT 141*  --   --   --  139*  --  130*  LABPROT 15.8*  --   --   --   --   --   --   INR 1.27  --   --   --   --   --   --   CREATININE  --  1.30*  --   --  1.06*  --  0.94  CKTOTAL  --   --  130  --   --   --   --   TROPONINI  --   --  0.04* 0.04*  --  0.04*  --     Estimated Creatinine Clearance: 43.2 mL/min (by C-G formula based on SCr of 0.94 mg/dL).   Medical History: Past Medical History:  Diagnosis Date  . CAD (coronary artery disease)    a. Possible dx -  Lexiscan myoview (9/11): EF 76%, normal wall motion, possible small anterior MI with mild peri-infarct ischemia but cannot rule out breast attenuation, managed conservatively.  . Cerebrovascular accident, embolic (HCC)    left frontal  . Chronic atrial fibrillation (HCC)    a. Chronic. Has had cardioembolic right renal infarct in 12/06 and cardioembolic event to the right arm requiring right brachial embolectomy. Both events occurred while subtherapeutic on coumadin. b. Slow VR 12/2013.  . Diastolic HF (heart failure) (HCC)    Echo (7/11): EF 60-65%, moderate LVH, severe diastolic dysfunction.   . Dyslipidemia   . HTN (hypertension)    a. Resistant HTN. Patient states that this has been poorly controlled for years.   . Iron deficiency anemia   . Memory loss   . Obese   . PAD (peripheral artery disease) (HCC)    a. peripheral arterial  dopplers (5/12) with right mid popliteal artery occlusion with reconstitution of arteries below the knees by collaterals. Only mild reduction in ABI on the right (not tissue-threatening).    Medications:  Scheduled:  . atorvastatin  80 mg Oral q1800  . feeding supplement (PRO-STAT SUGAR FREE 64)  30 mL Oral BID  . metoprolol tartrate  12.5 mg Oral BID   Infusions:  . dextrose 5 % and 0.45% NaCl 75 mL/hr at 06/08/17 0258  . diltiazem (CARDIZEM) infusion 10 mg/hr (06/08/17 0703)   PRN: acetaminophen, haloperidol lactate, ondansetron (ZOFRAN) IV  Assessment: 83 y.o. female with medical history significant of hypertension, hyperlipidemia, PVD, dCHF, stroke, CAD, dementia, CKD-III, who presents with generalized weakness, worsening mental status and possible fall.   Patient has a history of atrial fibrillation on xarelto  daily which has been on hold since admission d/t fall with hematomas on head. CT head is negative for acute intracranial abnormalities. MRI brain on 9/15 showed evidence of old CVA but no new CVA. CHA2DS2-VASc Scoreis 8, needs oral anticoagulation. Because  of the drug-drug interaction between Xarelto and diltiazem (increased exposure to Xarelto due to inhibition of metabolism by diltiazem), will change to Eliquis.  Goal of Therapy:  Therapeutic anticoagulation, prevention of thrombosis Monitor platelets by anticoagulation protocol: Yes   Plan:  Eliquis  PO bid Continue to follow renal function and signs/symptoms of bleeding Monitor weight - if drops < 60kg, will need to adjust dose to 2.5mg  bid  Loralee Pacas, PharmD, BCPS Pager: [phone] 06/08/2017,8:34 AM   Addendum: Now consulted to change Eliquis to Lovenox since patient cannot swallow. Start Lovenox /kg SQ q12h.  Loralee Pacas, PharmD, BCPS 06/08/2017 9:17 AM

## 2017-06-08 NOTE — Progress Notes (Signed)
Initial Nutrition Assessment  INTERVENTION:   Diet advancement per MD Will await GOC decisions for nutritional needs  NUTRITION DIAGNOSIS:   Inadequate oral intake related to inability to eat as evidenced by NPO status.  GOAL:   Patient will meet greater than or equal to 90% of their needs  MONITOR:   Diet advancement, Labs, Weight trends, I & O's  REASON FOR ASSESSMENT:   Malnutrition Screening Tool    ASSESSMENT:   83 y.o.femalewith medical history significant of hypertension, hyperlipidemia, PVD, dCHF, atrial fibrillation on xarelto,stroke, CAD, dementia, SKD-III, who presents with generalized weakness, worsening mental status and possible fall.   Patient unable to provide history given dementia and s/p stroke in August 2018. Pt with no family at bedside. Per chart review, pt's daughter has been difficult to reach.  Pt currently NPO. SLP to see for evaluation.   Per chart review, pt has lost 8 lb since 9/5 (5% wt loss x 11 days).   Labs reviewed. Medications reviewed.  Diet Order:  Diet NPO time specified  Skin:  Reviewed, no issues  Last BM:  9/16  Height:   Ht Readings from Last 1 Encounters:  06/07/17  (1.676 m)    Weight:   Wt Readings from Last 1 Encounters:  06/07/17 143 lb 1.3 oz (64.9 kg)    Ideal Body Weight:  59.1 kg  BMI:  Body mass index is 23.09 kg/m.  Estimated Nutritional Needs:   Kcal:  1600-1800  Protein:  70-80g  Fluid:  1.8L/day  EDUCATION NEEDS:   No education needs identified at this time  Tilda Franco, MS, RD, LDN Pager: [phone] After Hours Pager: [phone]

## 2017-06-08 NOTE — Progress Notes (Signed)
Patient ID: [name], female   DOB: [birth year], 83 y.o.   MRN: [id number]                                                                PROGRESS NOTE                                                                                                                                                                                                             Patient Demographics:    [name], is a 83 y.o. female, DOB - [birth year], WUJ:[id number]  Admit date - 06/06/2017   Admitting Physician Lorretta Harp, MD  Outpatient Primary MD for the patient is System, Provider Not In  LOS - 2  Outpatient Specialists:    Chief Complaint  Patient presents with  . Weakness       Brief Narrative  83 y.o.femalewith medical history significant of hypertension, hyperlipidemia, PVD, dCHF, atrial fibrillation on xarelto,stroke, CAD, dementia, SKD-III, who presents with generalized weakness, worsening mental status and possible fall.   Patient has dementia (not sure about the baseline mental status), is unable to provide accurate medical history, therefore, most of the history is obtained by discussing the case with ED physician, per EMS report, and with the nursing staff. I have tried 4 times to contact her daughter without success.  Patient was recently hospitalized from 8/5-8/9 for possible MCA stroke. Pt was unable to do MRI due to patient not being able to cooperate,but repeatedCT showed loss of gray-white differentiation along the left posterior insular ribbon is concerning for progressive left MCA territory infarct. She is on Xarelto for A fib.   Per reports, today pt has generalized weakness, worsening mental status and is more confused. She was found in her feces and urine. PerEDP, Dr. Marylen Ponto note, pt was isoriented to time and place,but otherwise seems like her baseline. Dr. Juleen China did not feel repeat hospitalization or testing of much utility at this time. Dr. Juleen China discussed with her daughter  [name] and informed of plan to dischargeback to facility with plans ofpalliative care follow-up. EDP, Dr. Corlis Leak also discussed with her daughter on the phone and made aplan to discharge patient to nursing home. SW hasworked out a plan to d/c pt to [facility], but pt was found to A fib with  RVR with HR up to 140, therefor pt needs to be admitted to hospital.   When I patient in ED, she is confused and not oriented 3. She is not following any commands. No active nausea, vomiting, diarrhea, respiratory distress, cough noted. She moves all extremities. She has right facial droop (not sure if this is new).She has 2 hematomas in frontal head.  ED Course:pt was found to have WBC 2.9, lactic acid 1.65, negative troponin, stable renal function, temperature normal, tachycardia, tachypnea, oxygen saturation 100% on room air, blood pressure 166/122. CT head is negative for acute intracranial abnormalities. Chest x-ray negative. Pending urinalysis. Patient is admitted to stepdown as inpatient. Cardizem drip was started.  Hospital Course: Pt was initiated on cardizem GTT and her heart rate improve.  MRI brain on 9/15 showed evidence of old CVA but no new CVA.  On 9/16 attempted to convert to po cardizem as well as restart xarelto/lipitor.  Multiple attempts made to contact daughter without success.     Subjective:    [name] today is more alert,  She opens her eyes,  Can't communicate. Aoxo0.  MRI brain negative for acute process 9/15  Attempted to call daughter to update but no response at either number  No obvious complaints.  Pt is able to move her arms.    Assessment  & Plan :    Principal Problem:   Atrial fibrillation with RVR (HCC) Active Problems:   HTN (hypertension)   Chronic diastolic heart failure (HCC)   Long term (current) use of anticoagulants   Acute metabolic encephalopathy   Dementia with behavioral disturbance   Pure hypercholesterolemia   Stroke [facility])   Fall   CKD  (chronic kidney disease), stage III    AMS  ? Dementia vs metabolic encephalopathy MRI brain => negative for acute process  Atrial fibrillation with RVR (HCC): pt has A fib with RVR with HR up to 140s per EDP. Hemodynamically stable. Triggeringfactor is not clear. Pending UA. No fever. CXR negative. CHA2DS2-VASc Scoreis 8, needs oral anticoagulation. Patient is on Xarelto at home.  continue Cardizem gtt Will convert to po cardizem today Restart xarelto  Acute metabolic encephalopathy:Etiology is not clear. Likely multifactorial etiology, including recent stroke, dementia, A. fib with RVR). Neurologic checks Pt unable to take oral meds  Hydrate with d5 1/2   HTN (hypertension): on Cardizem prn hydralazine IV  Chronic diastolic heart failure (HCC):2-D echo on 8/60/18 showed EF of 55-60%. Patient does not have leg edema JVD. Patient is started on diuretics at home.  Dementia with behavioral disturbance: -hold donepezil untilher mental status improves -prn haldol 1 mg q12h for agitation.  Pure hypercholesterolemia: Resume lipitor  Stroke [facility]): pt has right facial droop that is consistent with her recent L MCA stroke. I agree with EDP, Dr. Juleen China that further workup for stroke will not change her management. Resume xarelto, lipitor  Possible Fall and frontal hematoma: -need PT/OT when able to (not not ordered yet)  CKD (chronic kidney disease), stage III: stable.Baseline creatinine 1.0-1.3. Her creatinine is 1.3 and a BUN 21. -Follow-up renal function by BMP lab  Anemia Mild  Disposition:ED physician discussed with her daughter on the phone, who agreed to discharge patient to nursing home. SW hasworked out aplan to d/c pt to [facility]. Prior physician tried 4 times to contact her daughter withoutsuccess. I have tried 8 times to contact daughter and not been successful -will need palliative care to follow up in nursing home   DVT  ppx: SCD Code  Status:Full code (I could not reach her family, her previous CODE STATUS wasfull code. Willtemporarily put Full Code now).  Family Communication: None at bed side.  Attempted to contact daughter as above Disposition Plan: Anticipate discharge to SNF Consults called:none Admission status: SDU/inpation     Lab Results  Component Value Date   PLT 130 (L) 06/08/2017      Anti-infectives    None        Objective:   Vitals:   06/08/17 0357 06/08/17 0400 06/08/17 0500 06/08/17 0600  BP:  (!) 155/131 (!) 146/103 (!) 143/78  Pulse:  (!) 104    Resp:  17 (!) 21 17  Temp: 99 F (37.2 C)     TempSrc: Axillary     SpO2:  100%    Weight:      Height:        Wt Readings from Last 3 Encounters:  06/07/17 64.9 kg (143 lb 1.3 oz)  06/04/17 67.4 kg (148 lb 9.4 oz)  05/28/17 68.5 kg (151 lb)     Intake/Output Summary (Last 24 hours) at 06/08/17 0749 Last data filed at 06/08/17 0509  Gross per 24 hour  Intake           2329.5 ml  Output              400 ml  Net           1929.5 ml     Physical Exam  Awake Alert, Oriented X 0, No new F.N deficits, Normal affect Holt.AT,PERRAL Supple Neck,No JVD, No cervical lymphadenopathy appriciated.  Symmetrical Chest wall movement, Good air movement bilaterally, CTAB Irr, Irr, s1, s2  +ve B.Sounds, Abd Soft, No tenderness, No organomegaly appriciated, No rebound - guarding or rigidity. No Cyanosis, Clubbing or edema, No new Rash or bruise      Data Review:    CBC  Recent Labs Lab 06/04/17 2017 06/04/17 2028 06/06/17 1223 06/07/17 0351 06/08/17 0329  WBC 3.0*  --  2.9* 3.4* 3.3*  HGB 13.0 13.6 12.6 11.2* 11.1*  HCT 41.3 40.0 38.8 34.4* 34.5*  PLT 121*  --  141* 139* 130*  MCV 91.2  --  88.6 90.1 88.9  MCH 28.7  --  28.8 29.3 28.6  MCHC 31.5  --  32.5 32.6 32.2  RDW 14.4  --  14.5 14.9 14.8  LYMPHSABS 0.8  --   --   --   --   MONOABS 0.4  --   --   --   --   EOSABS 0.0  --   --   --   --   BASOSABS 0.0  --    --   --   --     Chemistries   Recent Labs Lab 06/04/17 2017 06/04/17 2028 06/06/17 1224 06/07/17 0351 06/08/17 0329  NA 135 139 140 140 136  K 4.8 4.7 4.1 3.8 3.9  CL 102 103 103 106 105  CO2 24  --  GLUCOSE 95 105* 74 91 90  BUN 22* 26* 21* 16 13  CREATININE 1.44* 1.40* 1.30* 1.06* 0.94  CALCIUM 9.1  --  9.4 8.8* 8.3*  AST 41  --  39  --  29  ALT 22  --  27  --  18  ALKPHOS 78  --  82  --  63  BILITOT 0.9  --  1.6*  --  1.8*   ------------------------------------------------------------------------------------------------------------------ No results for input(s):  CHOL, HDL, LDLCALC, TRIG, CHOLHDL, LDLDIRECT in the last 72 hours.  Lab Results  Component Value Date   HGBA1C 5.3 04/28/2017   ------------------------------------------------------------------------------------------------------------------  Recent Labs  06/07/17 0351  TSH 0.542   ------------------------------------------------------------------------------------------------------------------ No results for input(s): VITAMINB12, FOLATE, FERRITIN, TIBC, IRON, RETICCTPCT in the last 72 hours.  Coagulation profile  Recent Labs Lab 06/06/17 1223  INR 1.27    No results for input(s): DDIMER in the last 72 hours.  Cardiac Enzymes  Recent Labs Lab 06/06/17 2147 06/07/17 0143 06/07/17 0730  TROPONINI 0.04* 0.04* 0.04*   ------------------------------------------------------------------------------------------------------------------    Component Value Date/Time   BNP 276.5 (H) 06/06/2017 2147    Inpatient Medications  Scheduled Meds: Continuous Infusions: . dextrose 5 % and 0.45% NaCl 75 mL/hr at 06/08/17 0258  . diltiazem (CARDIZEM) infusion 10 mg/hr (06/08/17 0703)   PRN Meds:.acetaminophen, haloperidol lactate, ondansetron (ZOFRAN) IV  Micro Results Recent Results (from the past 240 hour(s))  MRSA PCR Screening     Status: None   Collection Time: 06/07/17 12:23 AM    Result Value Ref Range Status   MRSA by PCR NEGATIVE NEGATIVE Final    Comment:        The GeneXpert MRSA Assay (FDA approved for NASAL specimens only), is one component of a comprehensive MRSA colonization surveillance program. It is not intended to diagnose MRSA infection nor to guide or monitor treatment for MRSA infections.     Radiology Reports Dg Chest 2 View  Result Date: 06/06/2017 CLINICAL DATA:  History of dementia with to hematoma is noted on head lying in bed not wanting to get up lying in urine and feces. EXAM: CHEST  2 VIEW COMPARISON:  04/27/2017 and 04/14/2015 FINDINGS: Lungs are adequately inflated without consolidation or effusion. Mild stable cardiomegaly. Minimal calcified plaque over the aortic arch. Moderate degenerate change of the spine. IMPRESSION: No acute cardiopulmonary disease. Aortic Atherosclerosis (ICD10-I70.0). Electronically Signed   By: Elberta Fortis M.D.   On: 06/06/2017 13:06   Ct Head Wo Contrast  Result Date: 06/06/2017 CLINICAL DATA:  Altered level of consciousness. EXAM: CT HEAD WITHOUT CONTRAST TECHNIQUE: Contiguous axial images were obtained from the base of the skull through the vertex without intravenous contrast. COMPARISON:  CT scan of June 04, 2017. FINDINGS: Brain: Mild chronic ischemic white matter disease is noted. Stable old left MCA infarction is noted. No mass effect or midline shift is noted. Ventricular size is within normal limits. There is no evidence of mass lesion, hemorrhage or acute infarction. Vascular: No hyperdense vessel or unexpected calcification. Skull: Normal. Negative for fracture or focal lesion. Sinuses/Orbits: No acute finding. Other: Stable mild bilateral frontal scalp hematomas are noted. IMPRESSION: Stable mild bilateral frontal scalp hematomas. Mild chronic ischemic white matter disease. Stable old left MCA infarction. No acute intracranial abnormality seen. Electronically Signed   By: Lupita Raider, M.D.    On: 06/06/2017 13:40   Ct Head Wo Contrast  Result Date: 05/28/2017 CLINICAL DATA:  Fall. Posterior neck pain. Head trauma, minor, patient on anti coagulation. EXAM: CT HEAD WITHOUT CONTRAST CT CERVICAL SPINE WITHOUT CONTRAST TECHNIQUE: Multidetector CT imaging of the head and cervical spine was performed following the standard protocol without intravenous contrast. Multiplanar CT image reconstructions of the cervical spine were also generated. COMPARISON:  CT head without contrast 04/28/2017. FINDINGS: CT HEAD FINDINGS Brain: Moderate atrophy and white matter changes are stable. Previous left parietal lobe infarct is stable. Previously suspected posterior left insular infarct is not present. Remote ischemic changes  of the basal ganglia are stable. The brainstem and cerebellum are normal. Ventricles are proportionate to the degree of atrophy. No significant extra-axial fluid collection is present. Vascular: Atherosclerotic calcifications are present within the cavernous internal carotid artery is bilaterally. There is no hyperdense vessel. Skull: A prominent right frontal scalp laceration and hematoma is present. There is no underlying fracture. Hyperostosis is again noted. Sinuses/Orbits: The paranasal sinuses and mastoid air cells are clear. CT CERVICAL SPINE FINDINGS Alignment: Slight anterolisthesis at C2-3 is stable. There straightening of the normal cervical lordosis. Grade 1 anterolisthesis at C7-T1 is stable. AP alignment is unchanged. Skull base and vertebrae: The craniocervical junction is normal. Degenerative changes at C1-2 or stable. A remote left T7 laminar fracture is stable. No acute fractures are present. Soft tissues and spinal canal: Atherosclerotic calcifications are present at carotid bifurcations bilaterally. Right thyroid goiter is stable. No significant cervical adenopathy is present. Disc levels: Left-sided osseous foraminal narrowing is most pronounced at C4-5 and C5-6. More moderate  disease is present at C6-7 on the left. Right-sided foramina are patent. Upper chest: The lung apices are clear. IMPRESSION: 1. Stable remote left C7 lamina fracture. 2. No acute cervical spine fracture. 3. Large right frontal scalp hematoma and laceration without underlying fracture. 4. Stable atrophy and white matter disease. 5. Remote left parietal lobe infarct is stable. Electronically Signed   By: Marin Roberts M.D.   On: 05/28/2017 15:21   Ct Cervical Spine Wo Contrast  Result Date: 05/28/2017 CLINICAL DATA:  Fall. Posterior neck pain. Head trauma, minor, patient on anti coagulation. EXAM: CT HEAD WITHOUT CONTRAST CT CERVICAL SPINE WITHOUT CONTRAST TECHNIQUE: Multidetector CT imaging of the head and cervical spine was performed following the standard protocol without intravenous contrast. Multiplanar CT image reconstructions of the cervical spine were also generated. COMPARISON:  CT head without contrast 04/28/2017. FINDINGS: CT HEAD FINDINGS Brain: Moderate atrophy and white matter changes are stable. Previous left parietal lobe infarct is stable. Previously suspected posterior left insular infarct is not present. Remote ischemic changes of the basal ganglia are stable. The brainstem and cerebellum are normal. Ventricles are proportionate to the degree of atrophy. No significant extra-axial fluid collection is present. Vascular: Atherosclerotic calcifications are present within the cavernous internal carotid artery is bilaterally. There is no hyperdense vessel. Skull: A prominent right frontal scalp laceration and hematoma is present. There is no underlying fracture. Hyperostosis is again noted. Sinuses/Orbits: The paranasal sinuses and mastoid air cells are clear. CT CERVICAL SPINE FINDINGS Alignment: Slight anterolisthesis at C2-3 is stable. There straightening of the normal cervical lordosis. Grade 1 anterolisthesis at C7-T1 is stable. AP alignment is unchanged. Skull base and vertebrae: The  craniocervical junction is normal. Degenerative changes at C1-2 or stable. A remote left T7 laminar fracture is stable. No acute fractures are present. Soft tissues and spinal canal: Atherosclerotic calcifications are present at carotid bifurcations bilaterally. Right thyroid goiter is stable. No significant cervical adenopathy is present. Disc levels: Left-sided osseous foraminal narrowing is most pronounced at C4-5 and C5-6. More moderate disease is present at C6-7 on the left. Right-sided foramina are patent. Upper chest: The lung apices are clear. IMPRESSION: 1. Stable remote left C7 lamina fracture. 2. No acute cervical spine fracture. 3. Large right frontal scalp hematoma and laceration without underlying fracture. 4. Stable atrophy and white matter disease. 5. Remote left parietal lobe infarct is stable. Electronically Signed   By: Marin Roberts M.D.   On: 05/28/2017 15:21   Mr Brain Wo Contrast  Result Date: 06/07/2017 CLINICAL DATA:  Altered mental status EXAM: MRI HEAD WITHOUT CONTRAST TECHNIQUE: Multiplanar, multiecho pulse sequences of the brain and surrounding structures were obtained without intravenous contrast. COMPARISON:  Head CT 06/06/2017 FINDINGS: The examination had to be discontinued prior to completion due to the patient's altered mental status. She was combative and unable to cooperate with the technologist's instructions. Only axial and coronal diffusion-weighted imaging could be obtained. Corresponding ADC maps were generated. There is elevated diffusion weighted signal intensity within the posterior left MCA distribution, corresponding to the site of old infarct. There is no clear diffusion restriction visible on the ADC map, though the images are degraded by motion. There is no midline shift or other mass effect. Unchanged size and configuration of the ventricles. IMPRESSION: 1. Truncated and motion limited examination. Only DWI/ADC images were obtained. 2. Old posterior left  MCA distribution infarct without any clear area of acute ischemia. However, sensitivity is limited by the degree of patient motion during the scan. DWI abnormalities in the posterior left MCA distribution are favored to be secondary to T2 shine through at the site of remote infarct. Electronically Signed   By: Deatra Robinson M.D.   On: 06/07/2017 14:11   Ct Head Code Stroke Wo Contrast  Result Date: 06/04/2017 CLINICAL DATA:  Code stroke. Initial evaluation for acute left facial droop. Fall. EXAM: CT HEAD WITHOUT CONTRAST TECHNIQUE: Contiguous axial images were obtained from the base of the skull through the vertex without intravenous contrast. COMPARISON:  Prior CT from 05/28/2017. FINDINGS: Brain: Examination limited by positioning and motion. Age-related cerebral atrophy with chronic microvascular ischemic disease. Remote posterior left MCA territory infarct. Associated laminar necrosis, similar to previous. No acute intracranial hemorrhage. No evidence for acute large vessel territory infarct. No mass lesion or midline shift. No mass effect. No hydrocephalus. No extra-axial fluid collection. Vascular: No hyperdense vessel. Scattered vascular calcifications noted within the carotid siphons. Skull: Multifocal frontal scalp contusions.  Calvarium intact. Sinuses/Orbits: Globes normal soft tissues within normal limits. Visualized paranasal sinuses are clear. Small bilateral mastoid effusions, left greater than right noted. Other: None. ASPECTS [facility] Stroke Program Early CT Score) - Ganglionic level infarction (caudate, lentiform nuclei, internal capsule, insula, M1-M3 cortex): 7 - Supraganglionic infarction (M4-M6 cortex): 3 Total score (0-10 with 10 being normal): 10 IMPRESSION: 1. No acute intracranial abnormality. 2. ASPECTS is 10 3. Multifocal soft tissue contusions about the forehead. Calvarium intact. 4. Stable atrophy with chronic microvascular ischemic disease. Remote left MCA territory infarct with  associated laminar necrosis. Critical Value/emergent results were called by telephone at the time of interpretation on 06/04/2017 at 8:41 pm to Dr. Laurence Slate, who verbally acknowledged these results. Electronically Signed   By: Rise Mu M.D.   On: 06/04/2017 20:43    Time Spent in minutes  30   Pearson Grippe M.D on 06/08/2017 at 7:49 AM  Between 7am to 7pm - Pager - [phone]  After 7pm go to [email or web address] - password [email or web address]  [facility] -  Office  [phone]

## 2017-06-09 ENCOUNTER — Inpatient Hospital Stay (HOSPITAL_COMMUNITY): Payer: Medicare Other

## 2017-06-09 DIAGNOSIS — I340 Nonrheumatic mitral (valve) insufficiency: Secondary | ICD-10-CM

## 2017-06-09 DIAGNOSIS — F0391 Unspecified dementia with behavioral disturbance: Secondary | ICD-10-CM

## 2017-06-09 LAB — COMPREHENSIVE METABOLIC PANEL
ALT: 18 U/L (ref 14–54)
AST: 30 U/L (ref 15–41)
Albumin: 2.4 g/dL — ABNORMAL LOW (ref 3.5–5.0)
Alkaline Phosphatase: 74 U/L (ref 38–126)
Anion gap: 6 (ref 5–15)
BUN: 12 mg/dL (ref 6–20)
CHLORIDE: 102 mmol/L (ref 101–111)
CO2: 26 mmol/L (ref 22–32)
CREATININE: 1 mg/dL (ref 0.44–1.00)
Calcium: 8.4 mg/dL — ABNORMAL LOW (ref 8.9–10.3)
GFR, EST AFRICAN AMERICAN: 59 mL/min — AB (ref >60)
GFR, EST NON AFRICAN AMERICAN: 51 mL/min — AB (ref >60)
Glucose, Bld: 94 mg/dL (ref 65–99)
Potassium: 3.4 mmol/L — ABNORMAL LOW (ref 3.5–5.1)
Sodium: 134 mmol/L — ABNORMAL LOW (ref 135–145)
Total Bilirubin: 1.6 mg/dL — ABNORMAL HIGH (ref 0.3–1.2)
Total Protein: 6.1 g/dL — ABNORMAL LOW (ref 6.5–8.1)

## 2017-06-09 LAB — URINE CULTURE: Culture: 80000 — AB

## 2017-06-09 LAB — CBC
HCT: 33.9 % — ABNORMAL LOW (ref 36.0–46.0)
Hemoglobin: 11.3 g/dL — ABNORMAL LOW (ref 12.0–15.0)
MCH: 29.7 pg (ref 26.0–34.0)
MCHC: 33.3 g/dL (ref 30.0–36.0)
MCV: 89.2 fL (ref 78.0–100.0)
Platelets: 140 10*3/uL — ABNORMAL LOW (ref 150–400)
RBC: 3.8 MIL/uL — AB (ref 3.87–5.11)
RDW: 14.5 % (ref 11.5–15.5)
WBC: 3 10*3/uL — AB (ref 4.0–10.5)

## 2017-06-09 LAB — ECHOCARDIOGRAM COMPLETE
Height: 66 in
Weight: 2289.26 oz

## 2017-06-09 LAB — GLUCOSE, CAPILLARY
GLUCOSE-CAPILLARY: 107 mg/dL — AB (ref 65–99)
GLUCOSE-CAPILLARY: 117 mg/dL — AB (ref 65–99)
Glucose-Capillary: 94 mg/dL (ref 65–99)

## 2017-06-09 MED ORDER — ENSURE ENLIVE PO LIQD
237.0000 mL | Freq: Two times a day (BID) | ORAL | Status: DC
  Administered 2017-06-10 – 2017-06-11 (×4): 237 mL via ORAL

## 2017-06-09 MED ORDER — METOPROLOL TARTRATE 25 MG PO TABS
25.0000 mg | ORAL_TABLET | Freq: Two times a day (BID) | ORAL | Status: DC
  Administered 2017-06-09 – 2017-06-11 (×5): 25 mg via ORAL
  Filled 2017-06-09 (×5): qty 1

## 2017-06-09 MED ORDER — METOPROLOL TARTRATE 5 MG/5ML IV SOLN
5.0000 mg | Freq: Once | INTRAVENOUS | Status: AC
  Administered 2017-06-09: 5 mg via INTRAVENOUS
  Filled 2017-06-09: qty 5

## 2017-06-09 MED ORDER — ORAL CARE MOUTH RINSE
15.0000 mL | Freq: Two times a day (BID) | OROMUCOSAL | Status: DC
  Administered 2017-06-09 – 2017-06-11 (×5): 15 mL via OROMUCOSAL

## 2017-06-09 MED ORDER — METOPROLOL TARTRATE 5 MG/5ML IV SOLN
INTRAVENOUS | Status: AC
  Filled 2017-06-09: qty 5

## 2017-06-09 NOTE — Progress Notes (Signed)
MD notified regarding pt HR maintaining in the 130s-150 bpm. Pt on Cardizem gtt at 15 mg/hr. PO Metoprolol given at 0900. Will continue to monitor.

## 2017-06-09 NOTE — Progress Notes (Addendum)
  Speech Language Pathology Treatment: Dysphagia  Patient Details Name: [name] MRN: [id number] DOB: October 17, 1932 Today's Date: 06/09/2017 Time: 1340-1405 SLP Time Calculation (min) (ACUTE ONLY): 25 min  Assessment / Plan / Recommendation Clinical Impression  Intake has been poor for this patient and per MD note family does not desire PEG (feeding tubes do not change outcomes for patient's with dementia/cognitive deficits over the age of 40 per research studies).    Pt was willing to consume Ensure via straw and using hand over hand assist helpful to improve neurological input/efficiency of swallow.  She does become easily distracted (eg RN placing oxygen sensor on pt's foot distracted pt) and recommend po/meals focused.   Pt with prolonged mastication of solids - and did not open mouth per SLP cues to assess clearance.  Recommend continue dys1/thin diet with strict precautions.  Suspect liquid intake will be most efficient for this patient but malnutrition and dehydration will likely continue to be risks due to pt's cognitive based dysphagia.  Will follow up briefly to assure pt on least restrictive diet and for family education.      HPI HPI: [name] a 83 y.o.femalewith medical history significant of hypertension, hyperlipidemia, PVD, dCHF, atrial fibrillation on xarelto,stroke 04/2017), CAD, dementia, SKD-III, who presents with generalized weakness, worsening mental status and possible fall. Pt found to have A-fib with RVR.  CT head is negative for acute intracranial abnormalities. MRI Old posterior left MCA distribution infarct without any clear area of acute ischemia. Initial BSE 04/28/17 recommended NPO. On 04/30/17 Dys 1, thin recommended.      SLP Plan  Continue with current plan of care       Recommendations  Diet recommendations: Dysphagia 1 (puree);Thin liquid Liquids provided via: Straw;Cup Medication Administration: Crushed with puree Supervision: Full  supervision/cueing for compensatory strategies (have pt help to hold cups, etc) Compensations: Minimize environmental distractions;Slow rate;Small sips/bites;Lingual sweep for clearance of pocketing Postural Changes and/or Swallow Maneuvers: Seated upright 90 degrees;Upright 30-60 min after meal                Oral Care Recommendations: Oral care BID Follow up Recommendations:  (TBD) SLP Visit Diagnosis: Dysphagia, oral phase (R13.11) Plan: Continue with current plan of care       GO               Donavan Burnet, MS [facility] SLP [phone]  Chales Abrahams 06/09/2017, 2:59 PM

## 2017-06-09 NOTE — Progress Notes (Signed)
CSW consulted to assist with dc planning. Pt is disoriented and unable to participate in dc planning. CSW has been unsuccessful with multiple  attempts to contact pt's daughter. PN reviewed. Please see CSW notes dated 06/06/17. CSW spoke with Marylene Land at [facility] to confirm dc plan. SNF will have a bed for pt once stable for dc. CSW will continue to follow to assist with dc planning needs.  Cori Razor LCSW [phone]

## 2017-06-09 NOTE — Progress Notes (Signed)
Patient ID: [name], female   DOB: 1933-09-22, 83 y.o.   MRN: [id number]                                                                PROGRESS NOTE                                                                                                                                                                                                             Patient Demographics:    [name], is a 83 y.o. female, DOB - 03-18-1933, WUJ:[id number]  Admit date - 06/06/2017   Admitting Physician Lorretta Harp, MD  Outpatient Primary MD for the patient is System, Provider Not In  LOS - 3  Outpatient Specialists:     Chief Complaint  Patient presents with  . Weakness       Brief Narrative     Subjective:    [name] yesterday was more alert, apparently speech came by and will be dysphagia 1 for now.  Its unclear to me if she can swallow properly.  Pt couldn't swallow oral pills yesterday due to noncooperation.    No headache, No chest pain, No abdominal pain - No Nausea, No new weakness tingling or numbness, No Cough - SOB.    Assessment  & Plan :    Principal Problem:   Atrial fibrillation with RVR (HCC) Active Problems:   HTN (hypertension)   Chronic diastolic heart failure (HCC)   Long term (current) use of anticoagulants   Acute metabolic encephalopathy   Dementia with behavioral disturbance   Pure hypercholesterolemia   Stroke [facility])   Fall   CKD (chronic kidney disease), stage III     AMS ? Dementia vs metabolic encephalopathy MRI brain => negative for acute process  Atrial fibrillation with RVR (HCC): pt has A fib with RVR with HR up to 140s per EDP. Hemodynamically stable. Triggeringfactor is not clear. Pending UA. No fever. CXR negative. CHA2DS2-VASc Scoreis 8, needs oral anticoagulation. Patient is on Xarelto at home.  Cont cardizem gtt D/c metoprolol iv Start metoprolol 25mg  po bid Cont lovenox Check echo  Acute metabolic encephalopathy:Etiology is  not clear. Likely multifactorial etiology, including recent stroke, dementia, A. fib with RVR). Neurologic checks Pt unable to take oral meds  Hydrate with d5  1/2   HTN (hypertension): on Cardizem prn hydralazine IV  Chronic diastolic heart failure (HCC):2-D echo on 8/60/18 showed EF of 55-60%. Patient does not have leg edema JVD. Patient is started on diuretics at home.  Dementia with behavioral disturbance: -hold donepezil untilher mental status improves -prn haldol 1 mg q12h for agitation.  Pure hypercholesterolemia: Resume lipitor  Stroke [facility]): pt has right facial droop that is consistent with her recent L MCA stroke. I agree with EDP, Dr. Juleen China that further workup for stroke will not change her management. Resume xarelto, lipitor  Possible Fall and frontal hematoma: -need PT/OT when able to (not not ordered yet)  CKD (chronic kidney disease), stage III: stable.Baseline creatinine 1.0-1.3. Her creatinine is 1.3 and a BUN 21. -Follow-up renal function by BMP lab  Anemia Mild  Disposition:ED physician discussed with her daughter on the phone, who agreed to discharge patient to nursing home. SW hasworked out aplan to d/c pt to [facility]. Prior physician tried4 times to contact her daughter withoutsuccess. I have tried 8 times to contact daughter and not been successful -will need palliative care to follow up in nursing home   DVT ppx: SCD Code Status: had a long discussion 9/16 w daughter. FULL CODE for now,  Family doesn't want PEG Family Communication: None at bed side. Attempted to contact daughter as above Disposition Plan: Anticipate discharge to SNF Consults called:none Admission status: SDU/inpation      Lab Results  Component Value Date   PLT 140 (L) 06/09/2017    Antibiotics  :    Anti-infectives    None        Objective:   Vitals:   06/09/17 0100 06/09/17 0300 06/09/17 0330 06/09/17 0345  BP: 94/63 96/82  102/78   Pulse: 88 82 96   Resp: Temp:    97.9 F (36.6 C)  TempSrc:    Axillary  SpO2: 95% 98% 99%   Weight:      Height:        Wt Readings from Last 3 Encounters:  06/07/17 64.9 kg (143 lb 1.3 oz)  06/04/17 67.4 kg (148 lb 9.4 oz)  05/28/17 68.5 kg (151 lb)     Intake/Output Summary (Last 24 hours) at 06/09/17 0609 Last data filed at 06/09/17 0500  Gross per 24 hour  Intake          2566.17 ml  Output              250 ml  Net          2316.17 ml     Physical Exam  Awake Alert, Oriented X 3, No new F.N deficits, Normal affect [name].AT,PERRAL Supple Neck,No JVD, No cervical lymphadenopathy appriciated.  Symmetrical Chest wall movement, Good air movement bilaterally, CTAB Irr, irr, s1, s2, ,No Gallops,Rubs or new Murmurs, No Parasternal Heave +ve B.Sounds, Abd Soft, No tenderness, No organomegaly appriciated, No rebound - guarding or rigidity. No Cyanosis, Clubbing or edema, No new Rash or bruise     Data Review:    CBC  Recent Labs Lab 06/04/17 2017 06/04/17 2028 06/06/17 1223 06/07/17 0351 06/08/17 0329 06/09/17 0357  WBC 3.0*  --  2.9* 3.4* 3.3* 3.0*  HGB 13.0 13.6 12.6 11.2* 11.1* 11.3*  HCT 41.3 40.0 38.8 34.4* 34.5* 33.9*  PLT 121*  --  141* 139* 130* 140*  MCV 91.2  --  88.6 90.1 88.9 89.2  MCH 28.7  --  28.8 29.3 28.6 29.7  MCHC 31.5  --  32.5 32.6 32.2 33.3  RDW 14.4  --  14.5 14.9 14.8 14.5  LYMPHSABS 0.8  --   --   --   --   --   MONOABS 0.4  --   --   --   --   --   EOSABS 0.0  --   --   --   --   --   BASOSABS 0.0  --   --   --   --   --     Chemistries   Recent Labs Lab 06/04/17 2017 06/04/17 2028 06/06/17 1224 06/07/17 0351 06/08/17 0329  NA 135 139 140 140 136  K 4.8 4.7 4.1 3.8 3.9  CL 102 103 103 106 105  CO2 24  --  GLUCOSE 95 105* 74 91 90  BUN 22* 26* 21* 16 13  CREATININE 1.44* 1.40* 1.30* 1.06* 0.94  CALCIUM 9.1  --  9.4 8.8* 8.3*  AST 41  --  39  --  29  ALT 22  --  27  --  18  ALKPHOS 78  --   82  --  63  BILITOT 0.9  --  1.6*  --  1.8*   ------------------------------------------------------------------------------------------------------------------ No results for input(s): CHOL, HDL, LDLCALC, TRIG, CHOLHDL, LDLDIRECT in the last 72 hours.  Lab Results  Component Value Date   HGBA1C 5.3 04/28/2017   ------------------------------------------------------------------------------------------------------------------  Recent Labs  06/07/17 0351  TSH 0.542   ------------------------------------------------------------------------------------------------------------------ No results for input(s): VITAMINB12, FOLATE, FERRITIN, TIBC, IRON, RETICCTPCT in the last 72 hours.  Coagulation profile  Recent Labs Lab 06/06/17 1223  INR 1.27    No results for input(s): DDIMER in the last 72 hours.  Cardiac Enzymes  Recent Labs Lab 06/06/17 2147 06/07/17 0143 06/07/17 0730  TROPONINI 0.04* 0.04* 0.04*   ------------------------------------------------------------------------------------------------------------------    Component Value Date/Time   BNP 276.5 (H) 06/06/2017 2147    Inpatient Medications  Scheduled Meds: . enoxaparin (LOVENOX) injection  1 mg/kg Subcutaneous Q12H  . metoprolol tartrate  2.5 mg Intravenous Q6H   Continuous Infusions: . dextrose 5 % and 0.45% NaCl 100 mL/hr at 06/09/17 0010  . diltiazem (CARDIZEM) infusion 5 mg/hr (06/09/17 0006)   PRN Meds:.acetaminophen, haloperidol lactate, ondansetron (ZOFRAN) IV  Micro Results Recent Results (from the past 240 hour(s))  MRSA PCR Screening     Status: None   Collection Time: 06/07/17 12:23 AM  Result Value Ref Range Status   MRSA by PCR NEGATIVE NEGATIVE Final    Comment:        The GeneXpert MRSA Assay (FDA approved for NASAL specimens only), is one component of a comprehensive MRSA colonization surveillance program. It is not intended to diagnose MRSA infection nor to guide or monitor  treatment for MRSA infections.   Urine Culture     Status: Abnormal (Preliminary result)   Collection Time: 06/07/17  4:37 AM  Result Value Ref Range Status   Specimen Description URINE, RANDOM  Final   Special Requests NONE  Final   Culture (A)  Final    80,000 COLONIES/mL ESCHERICHIA COLI SUSCEPTIBILITIES TO FOLLOW Performed at [facility], [address], [place], [place] [address]    Report Status PENDING  Incomplete    Radiology Reports Dg Chest 2 View  Result Date: 06/06/2017 CLINICAL DATA:  History of dementia with to hematoma is noted on head lying in bed not wanting to get up lying in urine and  feces. EXAM: CHEST  2 VIEW COMPARISON:  04/27/2017 and 04/14/2015 FINDINGS: Lungs are adequately inflated without consolidation or effusion. Mild stable cardiomegaly. Minimal calcified plaque over the aortic arch. Moderate degenerate change of the spine. IMPRESSION: No acute cardiopulmonary disease. Aortic Atherosclerosis (ICD10-I70.0). Electronically Signed   By: Elberta Fortis M.D.   On: 06/06/2017 13:06   Ct Head Wo Contrast  Result Date: 06/06/2017 CLINICAL DATA:  Altered level of consciousness. EXAM: CT HEAD WITHOUT CONTRAST TECHNIQUE: Contiguous axial images were obtained from the base of the skull through the vertex without intravenous contrast. COMPARISON:  CT scan of June 04, 2017. FINDINGS: Brain: Mild chronic ischemic white matter disease is noted. Stable old left MCA infarction is noted. No mass effect or midline shift is noted. Ventricular size is within normal limits. There is no evidence of mass lesion, hemorrhage or acute infarction. Vascular: No hyperdense vessel or unexpected calcification. Skull: Normal. Negative for fracture or focal lesion. Sinuses/Orbits: No acute finding. Other: Stable mild bilateral frontal scalp hematomas are noted. IMPRESSION: Stable mild bilateral frontal scalp hematomas. Mild chronic ischemic white matter disease. Stable old left MCA  infarction. No acute intracranial abnormality seen. Electronically Signed   By: Lupita Raider, M.D.   On: 06/06/2017 13:40   Ct Head Wo Contrast  Result Date: 05/28/2017 CLINICAL DATA:  Fall. Posterior neck pain. Head trauma, minor, patient on anti coagulation. EXAM: CT HEAD WITHOUT CONTRAST CT CERVICAL SPINE WITHOUT CONTRAST TECHNIQUE: Multidetector CT imaging of the head and cervical spine was performed following the standard protocol without intravenous contrast. Multiplanar CT image reconstructions of the cervical spine were also generated. COMPARISON:  CT head without contrast 04/28/2017. FINDINGS: CT HEAD FINDINGS Brain: Moderate atrophy and white matter changes are stable. Previous left parietal lobe infarct is stable. Previously suspected posterior left insular infarct is not present. Remote ischemic changes of the basal ganglia are stable. The brainstem and cerebellum are normal. Ventricles are proportionate to the degree of atrophy. No significant extra-axial fluid collection is present. Vascular: Atherosclerotic calcifications are present within the cavernous internal carotid artery is bilaterally. There is no hyperdense vessel. Skull: A prominent right frontal scalp laceration and hematoma is present. There is no underlying fracture. Hyperostosis is again noted. Sinuses/Orbits: The paranasal sinuses and mastoid air cells are clear. CT CERVICAL SPINE FINDINGS Alignment: Slight anterolisthesis at C2-3 is stable. There straightening of the normal cervical lordosis. Grade 1 anterolisthesis at C7-T1 is stable. AP alignment is unchanged. Skull base and vertebrae: The craniocervical junction is normal. Degenerative changes at C1-2 or stable. A remote left T7 laminar fracture is stable. No acute fractures are present. Soft tissues and spinal canal: Atherosclerotic calcifications are present at carotid bifurcations bilaterally. Right thyroid goiter is stable. No significant cervical adenopathy is present.  Disc levels: Left-sided osseous foraminal narrowing is most pronounced at C4-5 and C5-6. More moderate disease is present at C6-7 on the left. Right-sided foramina are patent. Upper chest: The lung apices are clear. IMPRESSION: 1. Stable remote left C7 lamina fracture. 2. No acute cervical spine fracture. 3. Large right frontal scalp hematoma and laceration without underlying fracture. 4. Stable atrophy and white matter disease. 5. Remote left parietal lobe infarct is stable. Electronically Signed   By: Marin Roberts M.D.   On: 05/28/2017 15:21   Ct Cervical Spine Wo Contrast  Result Date: 05/28/2017 CLINICAL DATA:  Fall. Posterior neck pain. Head trauma, minor, patient on anti coagulation. EXAM: CT HEAD WITHOUT CONTRAST CT CERVICAL SPINE WITHOUT CONTRAST TECHNIQUE: Multidetector  CT imaging of the head and cervical spine was performed following the standard protocol without intravenous contrast. Multiplanar CT image reconstructions of the cervical spine were also generated. COMPARISON:  CT head without contrast 04/28/2017. FINDINGS: CT HEAD FINDINGS Brain: Moderate atrophy and white matter changes are stable. Previous left parietal lobe infarct is stable. Previously suspected posterior left insular infarct is not present. Remote ischemic changes of the basal ganglia are stable. The brainstem and cerebellum are normal. Ventricles are proportionate to the degree of atrophy. No significant extra-axial fluid collection is present. Vascular: Atherosclerotic calcifications are present within the cavernous internal carotid artery is bilaterally. There is no hyperdense vessel. Skull: A prominent right frontal scalp laceration and hematoma is present. There is no underlying fracture. Hyperostosis is again noted. Sinuses/Orbits: The paranasal sinuses and mastoid air cells are clear. CT CERVICAL SPINE FINDINGS Alignment: Slight anterolisthesis at C2-3 is stable. There straightening of the normal cervical lordosis.  Grade 1 anterolisthesis at C7-T1 is stable. AP alignment is unchanged. Skull base and vertebrae: The craniocervical junction is normal. Degenerative changes at C1-2 or stable. A remote left T7 laminar fracture is stable. No acute fractures are present. Soft tissues and spinal canal: Atherosclerotic calcifications are present at carotid bifurcations bilaterally. Right thyroid goiter is stable. No significant cervical adenopathy is present. Disc levels: Left-sided osseous foraminal narrowing is most pronounced at C4-5 and C5-6. More moderate disease is present at C6-7 on the left. Right-sided foramina are patent. Upper chest: The lung apices are clear. IMPRESSION: 1. Stable remote left C7 lamina fracture. 2. No acute cervical spine fracture. 3. Large right frontal scalp hematoma and laceration without underlying fracture. 4. Stable atrophy and white matter disease. 5. Remote left parietal lobe infarct is stable. Electronically Signed   By: Marin Roberts M.D.   On: 05/28/2017 15:21   Mr Brain Wo Contrast  Result Date: 06/07/2017 CLINICAL DATA:  Altered mental status EXAM: MRI HEAD WITHOUT CONTRAST TECHNIQUE: Multiplanar, multiecho pulse sequences of the brain and surrounding structures were obtained without intravenous contrast. COMPARISON:  Head CT 06/06/2017 FINDINGS: The examination had to be discontinued prior to completion due to the patient's altered mental status. She was combative and unable to cooperate with the technologist's instructions. Only axial and coronal diffusion-weighted imaging could be obtained. Corresponding ADC maps were generated. There is elevated diffusion weighted signal intensity within the posterior left MCA distribution, corresponding to the site of old infarct. There is no clear diffusion restriction visible on the ADC map, though the images are degraded by motion. There is no midline shift or other mass effect. Unchanged size and configuration of the ventricles. IMPRESSION:  1. Truncated and motion limited examination. Only DWI/ADC images were obtained. 2. Old posterior left MCA distribution infarct without any clear area of acute ischemia. However, sensitivity is limited by the degree of patient motion during the scan. DWI abnormalities in the posterior left MCA distribution are favored to be secondary to T2 shine through at the site of remote infarct. Electronically Signed   By: Deatra Robinson M.D.   On: 06/07/2017 14:11   Ct Head Code Stroke Wo Contrast  Result Date: 06/04/2017 CLINICAL DATA:  Code stroke. Initial evaluation for acute left facial droop. Fall. EXAM: CT HEAD WITHOUT CONTRAST TECHNIQUE: Contiguous axial images were obtained from the base of the skull through the vertex without intravenous contrast. COMPARISON:  Prior CT from 05/28/2017. FINDINGS: Brain: Examination limited by positioning and motion. Age-related cerebral atrophy with chronic microvascular ischemic disease. Remote posterior left  MCA territory infarct. Associated laminar necrosis, similar to previous. No acute intracranial hemorrhage. No evidence for acute large vessel territory infarct. No mass lesion or midline shift. No mass effect. No hydrocephalus. No extra-axial fluid collection. Vascular: No hyperdense vessel. Scattered vascular calcifications noted within the carotid siphons. Skull: Multifocal frontal scalp contusions.  Calvarium intact. Sinuses/Orbits: Globes normal soft tissues within normal limits. Visualized paranasal sinuses are clear. Small bilateral mastoid effusions, left greater than right noted. Other: None. ASPECTS [facility] Stroke Program Early CT Score) - Ganglionic level infarction (caudate, lentiform nuclei, internal capsule, insula, M1-M3 cortex): 7 - Supraganglionic infarction (M4-M6 cortex): 3 Total score (0-10 with 10 being normal): 10 IMPRESSION: 1. No acute intracranial abnormality. 2. ASPECTS is 10 3. Multifocal soft tissue contusions about the forehead. Calvarium intact. 4.  Stable atrophy with chronic microvascular ischemic disease. Remote left MCA territory infarct with associated laminar necrosis. Critical Value/emergent results were called by telephone at the time of interpretation on 06/04/2017 at 8:41 pm to Dr. Laurence Slate, who verbally acknowledged these results. Electronically Signed   By: Rise Mu M.D.   On: 06/04/2017 20:43    Time Spent in minutes  30   Pearson Grippe M.D on 06/09/2017 at 6:09 AM  Between 7am to 7pm - Pager - [phone]  After 7pm go to [email or web address] - password [facility]  [facility] -  Office  [phone]

## 2017-06-09 NOTE — Progress Notes (Signed)
   06/09/17 1600  Clinical Encounter Type  Visited With Patient  Visit Type Initial  Spiritual Encounters  Spiritual Needs Prayer  Stress Factors  Patient Stress Factors (Hard to understand seemed to be asking for prayers for her f)  patient was alone in the room and looked sad.  I just stopped in to say hello.  She was hard to understand and tried to talk about family and seemed like some stressful situation.  Not sure she understood my asking for prayer.  Indicated no family to come visit her and she was alone.   Chaplain Agustin Cree

## 2017-06-09 NOTE — Care Management Note (Signed)
Case Management Note  Patient Details  Name: [name] MRN: [id number] Date of Birth: November 04, 1932  Subjective/Objective:                  A.fib with rvr and iv Cardize  Action/Plan: Date:  June 09, 2017 Chart reviewed for concurrent status and case management needs. Will continue to follow patient progress. Discharge Planning: following for needs Expected discharge date: [id number] Marcelle Smiling, BSN, [place], [place]   [phone]  Expected Discharge Date:   (unknown)               Expected Discharge Plan:  Home/Self Care  In-House Referral:     Discharge planning Services  CM Consult  Post Acute Care Choice:    Choice offered to:     DME Arranged:    DME Agency:     HH Arranged:    HH Agency:     Status of Service:  In process, will continue to follow  If discussed at Long Length of Stay Meetings, dates discussed:    Additional Comments:  Golda Acre, RN 06/09/2017, 9:00 AM

## 2017-06-09 NOTE — Progress Notes (Signed)
  Echocardiogram 2D Echocardiogram has been performed.  Jasten Guyette L Androw 06/09/2017, 11:47 AM

## 2017-06-10 DIAGNOSIS — I6300 Cerebral infarction due to thrombosis of unspecified precerebral artery: Secondary | ICD-10-CM

## 2017-06-10 LAB — COMPREHENSIVE METABOLIC PANEL
ALBUMIN: 2.3 g/dL — AB (ref 3.5–5.0)
ALK PHOS: 76 U/L (ref 38–126)
ALT: 25 U/L (ref 14–54)
ANION GAP: 8 (ref 5–15)
AST: 40 U/L (ref 15–41)
BILIRUBIN TOTAL: 1.5 mg/dL — AB (ref 0.3–1.2)
BUN: 15 mg/dL (ref 6–20)
CALCIUM: 8.1 mg/dL — AB (ref 8.9–10.3)
CO2: 24 mmol/L (ref 22–32)
Chloride: 100 mmol/L — ABNORMAL LOW (ref 101–111)
Creatinine, Ser: 1.14 mg/dL — ABNORMAL HIGH (ref 0.44–1.00)
GFR calc Af Amer: 50 mL/min — ABNORMAL LOW (ref >60)
GFR calc non Af Amer: 44 mL/min — ABNORMAL LOW (ref >60)
GLUCOSE: 96 mg/dL (ref 65–99)
Potassium: 3.2 mmol/L — ABNORMAL LOW (ref 3.5–5.1)
Sodium: 132 mmol/L — ABNORMAL LOW (ref 135–145)
TOTAL PROTEIN: 5.8 g/dL — AB (ref 6.5–8.1)

## 2017-06-10 LAB — GLUCOSE, CAPILLARY
GLUCOSE-CAPILLARY: 92 mg/dL (ref 65–99)
GLUCOSE-CAPILLARY: 76 mg/dL (ref 65–99)
Glucose-Capillary: 91 mg/dL (ref 65–99)
Glucose-Capillary: 92 mg/dL (ref 65–99)
Glucose-Capillary: 101 mg/dL — ABNORMAL HIGH (ref 65–99)

## 2017-06-10 LAB — CBC
HEMATOCRIT: 34.4 % — AB (ref 36.0–46.0)
HEMOGLOBIN: 11.5 g/dL — AB (ref 12.0–15.0)
MCH: 29.3 pg (ref 26.0–34.0)
MCHC: 33.4 g/dL (ref 30.0–36.0)
MCV: 87.5 fL (ref 78.0–100.0)
Platelets: 169 10*3/uL (ref 150–400)
RBC: 3.93 MIL/uL (ref 3.87–5.11)
RDW: 14.4 % (ref 11.5–15.5)
WBC: 3.1 10*3/uL — ABNORMAL LOW (ref 4.0–10.5)

## 2017-06-10 MED ORDER — APIXABAN 5 MG PO TABS
5.0000 mg | ORAL_TABLET | Freq: Two times a day (BID) | ORAL | Status: DC
  Administered 2017-06-10 – 2017-06-11 (×3): 5 mg via ORAL
  Filled 2017-06-10 (×3): qty 1

## 2017-06-10 MED ORDER — DILTIAZEM HCL 30 MG PO TABS
30.0000 mg | ORAL_TABLET | ORAL | Status: DC
  Administered 2017-06-10 – 2017-06-11 (×10): 30 mg via ORAL
  Filled 2017-06-10 (×10): qty 1

## 2017-06-10 MED ORDER — DILTIAZEM HCL-DEXTROSE 100-5 MG/100ML-% IV SOLN (PREMIX)
5.0000 mg/h | INTRAVENOUS | Status: AC
  Administered 2017-06-10: 15 mg/h via INTRAVENOUS
  Filled 2017-06-10: qty 100

## 2017-06-10 MED ORDER — ATORVASTATIN CALCIUM 40 MG PO TABS
80.0000 mg | ORAL_TABLET | Freq: Every day | ORAL | Status: DC
  Administered 2017-06-10 – 2017-06-11 (×2): 80 mg via ORAL
  Filled 2017-06-10 (×2): qty 2

## 2017-06-10 MED ORDER — DEXTROSE 5 % IV SOLN
1.0000 g | INTRAVENOUS | Status: AC
  Administered 2017-06-10: 1 g via INTRAVENOUS
  Filled 2017-06-10: qty 10

## 2017-06-10 MED ORDER — DEXTROSE 5 % IV SOLN: 1.0000 g | INTRAVENOUS | Status: DC

## 2017-06-10 MED ORDER — POTASSIUM CHLORIDE CRYS ER 20 MEQ PO TBCR
40.0000 meq | EXTENDED_RELEASE_TABLET | Freq: Once | ORAL | Status: AC
  Administered 2017-06-10: 40 meq via ORAL
  Filled 2017-06-10: qty 2

## 2017-06-10 NOTE — Progress Notes (Signed)
OT Cancellation Note  Patient Details Name: KATYANA TROLINGER MRN: [id number] DOB: 31-Dec-1932   Cancelled Treatment:    Reason Eval/Treat Not Completed: Other (comment)Noted plan is for SNF- will defer OT eval to SNF Thanks, Lise Auer, OT [phone]  Einar Crow D 06/10/2017, 1:08 PM

## 2017-06-10 NOTE — Progress Notes (Signed)
eLink Physician-Brief Progress Note Patient Name: [name] DOB: Mar 22, 1933 MRN: [id number]   Date of Service  06/10/2017  HPI/Events of Note  Notified of urine culture with many bacteria identified as Escherichia coli. Patient currently not on antibiotic therapy. Urinalysis reviewed revealing negative leukocytes and nitrite. Rounding note reviewed.   eICU Interventions  1. Rocephin 1 mg IV 1 dose 2. Defer continuation & further antibiotic therapy to rounding hospitalist      Intervention Category Major Interventions: Infection - evaluation and management  Lawanda Cousins 06/10/2017, 12:19 AM

## 2017-06-10 NOTE — Evaluation (Signed)
Physical Therapy Evaluation Patient Details Name: [name] MRN: [id number] DOB: 1933/06/01 Today's Date: 06/10/2017   History of Present Illness  83 y.o.femalewith medical history significant of hypertension, hyperlipidemia, PVD, dCHF, atrial fibrillation on xarelto,stroke, CAD, dementia, SKD-III, who presents with generalized weakness, worsening mental status and possible fall. Recently DC from SNF . has had multiple falls.   Clinical Impression  The patient was  Able to participate in evaluation in that she did not resist the activity. Patient does not follow any directions. Will provide a trial of PT for mobility . Pt admitted with above diagnosis. Pt currently with functional limitations due to the deficits listed below (see PT Problem List).  Pt will benefit from skilled PT to increase their independence and safety with mobility to allow discharge to the venue listed below.       Follow Up Recommendations SNF;Supervision/Assistance - 24 hour    Equipment Recommendations  None recommended by PT    Recommendations for Other Services       Precautions / Restrictions Precautions Precautions: Fall      Mobility  Bed Mobility Overal bed mobility: Needs Assistance Bed Mobility: Supine to Sit;Sit to Supine     Supine to sit: Max assist;+2 for physical assistance;+2 for safety/equipment;HOB elevated Sit to supine: Max assist;+2 for physical assistance;+2 for safety/equipment   General bed mobility comments: patient is noted to sit upright in the bed.  Transfers                 General transfer comment: NT  Ambulation/Gait                Stairs            Wheelchair Mobility    Modified Rankin (Stroke Patients Only)       Balance Overall balance assessment: History of Falls;Needs assistance Sitting-balance support: Feet unsupported;Single extremity supported Sitting balance-Leahy Scale: Poor Sitting balance - Comments: sits at midline with  no assistance                                     Pertinent Vitals/Pain Pain Assessment: Faces Pain Score: 0-No pain    Home Living Family/patient expects to be discharged to:: Skilled nursing facility                      Prior Function           Comments: unsure of patient's level of  function for self care and mobility, Noted in ED for falls several times      Hand Dominance        Extremity/Trunk Assessment   Upper Extremity Assessment Upper Extremity Assessment: RUE deficits/detail;LUE deficits/detail RUE Deficits / Details: tends to keep hand fisted, increased tone to passive ROM, did not volitional movement at hand LUE Deficits / Details: grossly noted to move the  arm  for support.    Lower Extremity Assessment Lower Extremity Assessment: RLE deficits/detail;LLE deficits/detail RLE Deficits / Details: did not move legs volitionally.  LLE Deficits / Details: same    Cervical / Trunk Assessment Cervical / Trunk Assessment: Normal  Communication      Cognition Arousal/Alertness: Awake/alert Behavior During Therapy: Flat affect Overall Cognitive Status: No family/caregiver present to determine baseline cognitive functioning  General Comments: the patient did not follow commands, was not agitated.  Speech not understandable/ nonsisical      General Comments      Exercises     Assessment/Plan    PT Assessment Patient needs continued PT services  PT Problem List Decreased range of motion;Decreased activity tolerance;Decreased balance;Decreased mobility;Decreased cognition       PT Treatment Interventions Therapeutic activities;Functional mobility training    PT Goals (Current goals can be found in the Care Plan section)  Acute Rehab PT Goals PT Goal Formulation: Patient unable to participate in goal setting Time For Goal Achievement: 06/24/17 (trial) Potential to Achieve Goals:  Fair    Frequency Min 2X/week   Barriers to discharge        Co-evaluation               AM-PAC PT "6 Clicks" Daily Activity  Outcome Measure Difficulty turning over in bed (including adjusting bedclothes, sheets and blankets)?: Unable Difficulty moving from lying on back to sitting on the side of the bed? : Unable Difficulty sitting down on and standing up from a chair with arms (e.g., wheelchair, bedside commode, etc,.)?: Unable Help needed moving to and from a bed to chair (including a wheelchair)?: Total Help needed walking in hospital room?: Total Help needed climbing 3-5 steps with a railing? : Total 6 Click Score: 6    End of Session   Activity Tolerance: Patient tolerated treatment well Patient left: in bed;with call bell/phone within reach;with bed alarm set Nurse Communication: Mobility status PT Visit Diagnosis: History of falling (Z91.81);Hemiplegia and hemiparesis Hemiplegia - caused by: Other cerebrovascular disease    Time: 1321-1335 PT Time Calculation (min) (ACUTE ONLY): 14 min   Charges:   PT Evaluation $PT Eval Low Complexity: 1 Low     PT G CodesBlanchard Kelch PT [phone]   Rada Hay 06/10/2017, 2:38 PM

## 2017-06-10 NOTE — Progress Notes (Addendum)
Patient ID: [name], female   DOB: 10/04/1932, 83 y.o.   MRN: [id number]                                                                PROGRESS NOTE                                                                                                                                                                                                             Patient Demographics:    [name], is a 83 y.o. female, DOB - [birth year], UMP:[id number]  Admit date - 06/06/2017   Admitting Physician Bonnie Costa, MD  Outpatient Primary MD for the patient is System, Provider Not In  LOS - 4  Outpatient Specialists:     Chief Complaint  Patient presents with  . Weakness       Brief Narrative    83 y.o.femalewith medical history significant of hypertension, hyperlipidemia, PVD, dCHF, atrial fibrillation on xarelto,stroke, CAD, dementia, SKD-III, who presents with generalized weakness, worsening mental status and possible fall.   Patient has dementia (not sure about the baseline mental status), is unable to provide accurate medical history, therefore, most of the history is obtained by discussing the case with ED physician, per EMS report, and with the nursing staff. I have tried 4 times to contact her daughter without success.  Patient was recently hospitalized from 8/5-8/9 for possible MCA stroke. Pt was unable to do MRI due to patient not being able to cooperate,but repeatedCT showed loss of gray-white differentiation along the left posterior insular ribbon is concerning for progressive left MCA territory infarct. She is on Xarelto for A fib.   Per reports, today pt has generalized weakness, worsening mental status and is more confused. She was found in her feces and urine. PerEDP, Dr. Eugenio Stephens note, pt was isoriented to time and place,but otherwise seems like her baseline. Dr. Wilson Stephens did not feel repeat hospitalization or testing of much utility at this time. Dr. Wilson Stephens discussed with her  daughter [name] and informed of plan to dischargeback to facility with plans ofpalliative care follow-up. EDP, Dr. Thomasene Stephens also discussed with her daughter on the phone and made aplan to discharge patient to nursing home. SW hasworked out a plan to d[facility]c pt [name], but pt was found to  A fib with RVR with HR up to 140, therefor pt needs to be admitted to hospital.   When I patient in ED, she is confused and not oriented 3. She is not following any commands. No active nausea, vomiting, diarrhea, respiratory distress, cough noted. She moves all extremities. She has right facial droop (not sure if this is new).She has 2 hematomas in frontal head.  ED Course:pt was found to have WBC 2.9, lactic acid 1.65, negative troponin, stable renal function, temperature normal, tachycardia, tachypnea, oxygen saturation 100% on room air, blood pressure 166/122. CT head is negative for acute intracranial abnormalities. Chest x-ray negative. Pending urinalysis. Patient is admitted to stepdown as inpatient. Cardizem drip was started.  Hospital Course: Pt was initiated on cardizem GTT and her heart rate improve.  MRI brain on 9/15 showed evidence of old CVA but no new CVA.  On 9/16 attempted to convert to po cardizem as well as restart xarelto/lipitor.  Multiple attempts made to contact daughter without success.  Finally on 9/16 met with family specifically daughter.  No PEG desired .  FULL CODE.    speech evaluated the patient on 9/16=> dysphagia 1 diet with thin liquid. Pt appears to be more alert and tolerating po.  Pt has poor po intake.  Repeat speech evaluation 9/17=> dysphagia 1 diet with thin liquid.    Subjective:    [name] today is more alert.  Pt has poor po intake   Nutritional supplement started yesterday. Heart rate  up and down. Depending upon agitation.   No headache, No chest pain, No abdominal pain - No Nausea, No new weakness tingling or numbness, No Cough - SOB.    Assessment  & Plan  :    Principal Problem:   Atrial fibrillation with RVR (HCC) Active Problems:   HTN (hypertension)   Chronic diastolic heart failure ([place])   Long term (current) use of anticoagulants   Acute metabolic encephalopathy   Dementia with behavioral disturbance   Pure hypercholesterolemia   Stroke [facility])   Fall   CKD (chronic kidney disease), stage III    AMS ? Dementia vs metabolic encephalopathy MRI brain => negative for acute process  Atrial fibrillation with RVR ([place]): pt has A fib with RVR with HR up to 140s per EDP. Hemodynamically stable. Triggeringfactor is not clear. Pending UA. No fever. CXR negative. CHA2DS2-VASc Scoreis 8, needs oral anticoagulation. Patient is on Xarelto at home.  Initially on cardizem gtt with metoprolol 2.100m iv q6h  Iv metoprolol d/c 9/17 Started metoprolol 26mpo bid 9/17 Will convert to po cardizem 9/18  cardizem 3022mo q4h Currently on lovenox 9/16-9/18, No xarelto due to drug interaction with cardizem.   DC lovenox Start on eliquis 9/18  Hypokalemia Replete Check bmp in am  Acute metabolic encephalopathy:Etiology is not clear. Likely multifactorial etiology, including recent stroke, dementia, A. fib with RVR). Neurologic checks Pt unable to take oral meds  Hydrated initially with d5 1/2   HTN (hypertension): on Cardizem, on metoprolol prn hydralazine IV  Chronic diastolic heart failure (HCCOEV):0-Jho on 8/60/18 showed EF of 55-60%. Patient does not have leg edema JVD. Patient is started on diuretics at home.  Dementia with behavioral disturbance: -hold donepezil untilher mental status improves -prn haldol 1 mg q12h for agitation.  Pure hypercholesterolemia: Resume lipitor 9/18  [facility] right [facility] droop that is consistent with her recent L MCA stroke. I agree with EDP, Dr. KohWilson Singerat further workup for stroke will  not change her management. Resume, lipitor 9/18  Possible Fall and frontal  hematoma: -need PT/OT when able to (not not ordered yet)  CKD (chronic kidney disease), stage III: stable.Baseline creatinine 1.0-1.3. Her creatinine is 1.3 and a BUN 21. Check cmp in am  Anemia, Mild Check cbc in am  + urine culture E. Coli  (80,000 CFU) Rocephin 1gm iv qday (9/18)  Disposition:(per admitting physician)ED physician discussed with her daughter on the phone, who agreed to discharge patient to nursing home. SW hasworked out aplan to d[facility] pt [name]. Prior physician tried4 times to contact her daughter withoutsuccess. I have tried 8 times to contact daughter and not been successful -will need palliative care to follow up in nursing home   DVT ppx: SCD Code Status:Full code, I spoke with daughter and family on 9/16,  No peg,  Family Communication: None at bed side. Attempted to contact daughter as above Disposition Plan: Anticipate discharge to SNF ([name]) Consults called:none Admission status: SDU/inpation    Lab Results  Component Value Date   PLT 169 06/10/2017    Antibiotics  :  Rocephin 9/18=>  Anti-infectives    Start     Dose/Rate Route Frequency Ordered Stop   06/10/17 0200  cefTRIAXone (ROCEPHIN) 1 g in dextrose 5 % 50 mL IVPB     1 g 100 mL/hr over 30 Minutes Intravenous Every 24 hours 06/10/17 0018 06/10/17 0133   06/10/17 0030  cefTRIAXone (ROCEPHIN) 1 g in dextrose 5 % 50 mL IVPB  Status:  Discontinued     1 g 100 mL/hr over 30 Minutes Intravenous Every 24 hours 06/10/17 0018 06/10/17 0018        Objective:   Vitals:   06/10/17 0200 06/10/17 0300 06/10/17 0400 06/10/17 0500  BP: (!) 103/59 96/71 (!) 114/50 95/70  Pulse:      Resp: (!) 25 14 (!) 21 19  Temp:  98 F (36.7 C)    TempSrc:  Axillary    SpO2:      Weight:      Height:        Wt Readings from Last 3 Encounters:  06/07/17 64.9 kg (143 lb 1.3 oz)  06/04/17 67.4 kg (148 lb 9.4 oz)  05/28/17 68.5 kg (151 lb)     Intake/Output Summary  (Last 24 hours) at 06/10/17 0615 Last data filed at 06/10/17 0500  Gross per 24 hour  Intake          1603.87 ml  Output              200 ml  Net          1403.87 ml     Physical Exam  Awake Alert, Oriented X 1, No new F.N deficits, Normal affect [name].AT,PERRAL Supple Neck,No JVD, No cervical lymphadenopathy appriciated.  Symmetrical Chest wall movement, Good air movement bilaterally, CTAB RRR,No Gallops,Rubs or new Murmurs, No Parasternal Heave +ve B.Sounds, Abd Soft, No tenderness, No organomegaly appriciated, No rebound - guarding or rigidity. No Cyanosis, Clubbing or edema, No new Rash or bruise      Data Review:    CBC  Recent Labs Lab 06/04/17 2017  06/06/17 1223 06/07/17 0351 06/08/17 0329 06/09/17 0357 06/10/17 0534  WBC 3.0*  --  2.9* 3.4* 3.3* 3.0* 3.1*  HGB 13.0  < > 12.6 11.2* 11.1* 11.3* 11.5*  HCT 41.3  < > 38.8 34.4* 34.5* 33.9* 34.4*  PLT 121*  --  141* 139* 130* 140* 169  MCV  91.2  --  88.6 90.1 88.9 89.2 87.5  MCH 28.7  --  28.8 29.3 28.6 29.7 29.3  MCHC 31.5  --  32.5 32.6 32.2 33.3 33.4  RDW 14.4  --  14.5 14.9 14.8 14.5 14.4  LYMPHSABS 0.8  --   --   --   --   --   --   MONOABS 0.4  --   --   --   --   --   --   EOSABS 0.0  --   --   --   --   --   --   BASOSABS 0.0  --   --   --   --   --   --   < > = values in this interval not displayed.  Chemistries   Recent Labs Lab 06/04/17 2017 06/04/17 2028 06/06/17 1224 06/07/17 0351 06/08/17 0329 06/09/17 0747  NA 135 139 140 140 136 134*  K 4.8 4.7 4.1 3.8 3.9 3.4*  CL 102 103 103 106 105 102  CO2 24  --  '28 28 24 26  ' GLUCOSE 95 105* 74 91 90 94  BUN 22* 26* 21* '16 13 12  ' CREATININE 1.44* 1.40* 1.30* 1.06* 0.94 1.00  CALCIUM 9.1  --  9.4 8.8* 8.3* 8.4*  AST 41  --  39  --  29 30  ALT 22  --  27  --  18 18  ALKPHOS 78  --  82  --  63 74  BILITOT 0.9  --  1.6*  --  1.8* 1.6*    ------------------------------------------------------------------------------------------------------------------ No results for input(s): CHOL, HDL, LDLCALC, TRIG, CHOLHDL, LDLDIRECT in the last 72 hours.  Lab Results  Component Value Date   HGBA1C 5.3 04/28/2017   ------------------------------------------------------------------------------------------------------------------ No results for input(s): TSH, T4TOTAL, T3FREE, THYROIDAB in the last 72 hours.  Invalid input(s): FREET3 ------------------------------------------------------------------------------------------------------------------ No results for input(s): VITAMINB12, FOLATE, FERRITIN, TIBC, IRON, RETICCTPCT in the last 72 hours.  Coagulation profile  Recent Labs Lab 06/06/17 1223  INR 1.27    No results for input(s): DDIMER in the last 72 hours.  Cardiac Enzymes  Recent Labs Lab 06/06/17 2147 06/07/17 0143 06/07/17 0730  TROPONINI 0.04* 0.04* 0.04*   ------------------------------------------------------------------------------------------------------------------    Component Value Date/Time   BNP 276.5 (H) 06/06/2017 2147    Inpatient Medications  Scheduled Meds: . enoxaparin (LOVENOX) injection  1 mg/kg Subcutaneous Q12H  . feeding supplement (ENSURE ENLIVE)  237 mL Oral BID BM  . mouth rinse  15 mL Mouth Rinse BID  . metoprolol tartrate  25 mg Oral BID   Continuous Infusions: . diltiazem (CARDIZEM) infusion 15 mg/hr (06/10/17 0500)   PRN Meds:.acetaminophen, haloperidol lactate, ondansetron (ZOFRAN) IV  Micro Results Recent Results (from the past 240 hour(s))  MRSA PCR Screening     Status: None   Collection Time: 06/07/17 12:23 AM  Result Value Ref Range Status   MRSA by PCR NEGATIVE NEGATIVE Final    Comment:        The GeneXpert MRSA Assay (FDA approved for NASAL specimens only), is one component of a comprehensive MRSA colonization surveillance program. It is not intended to  diagnose MRSA infection nor to guide or monitor treatment for MRSA infections.   Urine Culture     Status: Abnormal   Collection Time: 06/07/17  4:37 AM  Result Value Ref Range Status   Specimen Description URINE, RANDOM  Final   Special Requests NONE  Final   Culture 80,000 COLONIES/mL  ESCHERICHIA COLI (A)  Final   Report Status 06/09/2017 FINAL  Final   Organism ID, Bacteria ESCHERICHIA COLI (A)  Final      Susceptibility   Escherichia coli - MIC*    AMPICILLIN <=2 SENSITIVE Sensitive     CEFAZOLIN <=4 SENSITIVE Sensitive     CEFTRIAXONE <=1 SENSITIVE Sensitive     CIPROFLOXACIN <=0.25 SENSITIVE Sensitive     GENTAMICIN <=1 SENSITIVE Sensitive     IMIPENEM <=0.25 SENSITIVE Sensitive     NITROFURANTOIN <=16 SENSITIVE Sensitive     TRIMETH/SULFA <=20 SENSITIVE Sensitive     AMPICILLIN/SULBACTAM <=2 SENSITIVE Sensitive     PIP/TAZO <=4 SENSITIVE Sensitive     Extended ESBL NEGATIVE Sensitive     * 80,000 COLONIES/mL ESCHERICHIA COLI    Radiology Reports Dg Chest 2 View  Result Date: 06/06/2017 CLINICAL DATA:  History of dementia with to hematoma is noted on head lying in bed not wanting to get up lying in urine and feces. EXAM: CHEST  2 VIEW COMPARISON:  04/27/2017 and 04/14/2015 FINDINGS: Lungs are adequately inflated without consolidation or effusion. Mild stable cardiomegaly. Minimal calcified plaque over the aortic arch. Moderate degenerate change of the spine. IMPRESSION: No acute cardiopulmonary disease. Aortic Atherosclerosis (ICD10-I70.0). Electronically Signed   By: Marin Olp M.D.   On: 06/06/2017 13:06   Ct Head Wo Contrast  Result Date: 06/06/2017 CLINICAL DATA:  Altered level of consciousness. EXAM: CT HEAD WITHOUT CONTRAST TECHNIQUE: Contiguous axial images were obtained from the base of the skull through the vertex without intravenous contrast. COMPARISON:  CT scan of June 04, 2017. FINDINGS: Brain: Mild chronic ischemic white matter disease is noted. Stable  old left MCA infarction is noted. No mass effect or midline shift is noted. Ventricular size is within normal limits. There is no evidence of mass lesion, hemorrhage or acute infarction. Vascular: No hyperdense vessel or unexpected calcification. Skull: Normal. Negative for fracture or focal lesion. Sinuses/Orbits: No acute finding. Other: Stable mild bilateral frontal scalp hematomas are noted. IMPRESSION: Stable mild bilateral frontal scalp hematomas. Mild chronic ischemic white matter disease. Stable old left MCA infarction. No acute intracranial abnormality seen. Electronically Signed   By: Marijo Conception, M.D.   On: 06/06/2017 13:40   Ct Head Wo Contrast  Result Date: 05/28/2017 CLINICAL DATA:  Fall. Posterior neck pain. Head trauma, minor, patient on anti coagulation. EXAM: CT HEAD WITHOUT CONTRAST CT CERVICAL SPINE WITHOUT CONTRAST TECHNIQUE: Multidetector CT imaging of the head and cervical spine was performed following the standard protocol without intravenous contrast. Multiplanar CT image reconstructions of the cervical spine were also generated. COMPARISON:  CT head without contrast 04/28/2017. FINDINGS: CT HEAD FINDINGS Brain: Moderate atrophy and white matter changes are stable. Previous left parietal lobe infarct is stable. Previously suspected posterior left insular infarct is not present. Remote ischemic changes of the basal ganglia are stable. The brainstem and cerebellum are normal. Ventricles are proportionate to the degree of atrophy. No significant extra-axial fluid collection is present. Vascular: Atherosclerotic calcifications are present within the cavernous internal carotid artery is bilaterally. There is no hyperdense vessel. Skull: A prominent right frontal scalp laceration and hematoma is present. There is no underlying fracture. Hyperostosis is again noted. Sinuses/Orbits: The paranasal sinuses and mastoid air cells are clear. CT CERVICAL SPINE FINDINGS Alignment: Slight  anterolisthesis at C2-3 is stable. There straightening of the normal cervical lordosis. Grade 1 anterolisthesis at C7-T1 is stable. AP alignment is unchanged. Skull base and vertebrae: The craniocervical junction is  normal. Degenerative changes at C1-2 or stable. A remote left T7 laminar fracture is stable. No acute fractures are present. Soft tissues and spinal canal: Atherosclerotic calcifications are present at carotid bifurcations bilaterally. Right thyroid goiter is stable. No significant cervical adenopathy is present. Disc levels: Left-sided osseous foraminal narrowing is most pronounced at C4-5 and C5-6. More moderate disease is present at C6-7 on the left. Right-sided foramina are patent. Upper chest: The lung apices are clear. IMPRESSION: 1. Stable remote left C7 lamina fracture. 2. No acute cervical spine fracture. 3. Large right frontal scalp hematoma and laceration without underlying fracture. 4. Stable atrophy and white matter disease. 5. Remote left parietal lobe infarct is stable. Electronically Signed   By: San Morelle M.D.   On: 05/28/2017 15:21   Ct Cervical Spine Wo Contrast  Result Date: 05/28/2017 CLINICAL DATA:  Fall. Posterior neck pain. Head trauma, minor, patient on anti coagulation. EXAM: CT HEAD WITHOUT CONTRAST CT CERVICAL SPINE WITHOUT CONTRAST TECHNIQUE: Multidetector CT imaging of the head and cervical spine was performed following the standard protocol without intravenous contrast. Multiplanar CT image reconstructions of the cervical spine were also generated. COMPARISON:  CT head without contrast 04/28/2017. FINDINGS: CT HEAD FINDINGS Brain: Moderate atrophy and white matter changes are stable. Previous left parietal lobe infarct is stable. Previously suspected posterior left insular infarct is not present. Remote ischemic changes of the basal ganglia are stable. The brainstem and cerebellum are normal. Ventricles are proportionate to the degree of atrophy. No  significant extra-axial fluid collection is present. Vascular: Atherosclerotic calcifications are present within the cavernous internal carotid artery is bilaterally. There is no hyperdense vessel. Skull: A prominent right frontal scalp laceration and hematoma is present. There is no underlying fracture. Hyperostosis is again noted. Sinuses/Orbits: The paranasal sinuses and mastoid air cells are clear. CT CERVICAL SPINE FINDINGS Alignment: Slight anterolisthesis at C2-3 is stable. There straightening of the normal cervical lordosis. Grade 1 anterolisthesis at C7-T1 is stable. AP alignment is unchanged. Skull base and vertebrae: The craniocervical junction is normal. Degenerative changes at C1-2 or stable. A remote left T7 laminar fracture is stable. No acute fractures are present. Soft tissues and spinal canal: Atherosclerotic calcifications are present at carotid bifurcations bilaterally. Right thyroid goiter is stable. No significant cervical adenopathy is present. Disc levels: Left-sided osseous foraminal narrowing is most pronounced at C4-5 and C5-6. More moderate disease is present at C6-7 on the left. Right-sided foramina are patent. Upper chest: The lung apices are clear. IMPRESSION: 1. Stable remote left C7 lamina fracture. 2. No acute cervical spine fracture. 3. Large right frontal scalp hematoma and laceration without underlying fracture. 4. Stable atrophy and white matter disease. 5. Remote left parietal lobe infarct is stable. Electronically Signed   By: San Morelle M.D.   On: 05/28/2017 15:21   Mr Brain Wo Contrast  Result Date: 06/07/2017 CLINICAL DATA:  Altered mental status EXAM: MRI HEAD WITHOUT CONTRAST TECHNIQUE: Multiplanar, multiecho pulse sequences of the brain and surrounding structures were obtained without intravenous contrast. COMPARISON:  Head CT 06/06/2017 FINDINGS: The examination had to be discontinued prior to completion due to the patient's altered mental status. She was  combative and unable to cooperate with the technologist's instructions. Only axial and coronal diffusion-weighted imaging could be obtained. Corresponding ADC maps were generated. There is elevated diffusion weighted signal intensity within the posterior left MCA distribution, corresponding to the site of old infarct. There is no clear diffusion restriction visible on the ADC map, though the images  are degraded by motion. There is no midline shift or other mass effect. Unchanged size and configuration of the ventricles. IMPRESSION: 1. Truncated and motion limited examination. Only DWI/ADC images were obtained. 2. Old posterior left MCA distribution infarct without any clear area of acute ischemia. However, sensitivity is limited by the degree of patient motion during the scan. DWI abnormalities in the posterior left MCA distribution are favored to be secondary to T2 shine through at the site of remote infarct. Electronically Signed   By: Ulyses Jarred M.D.   On: 06/07/2017 14:11   Ct Head Code Stroke Wo Contrast  Result Date: 06/04/2017 CLINICAL DATA:  Code stroke. Initial evaluation for acute left facial droop. Fall. EXAM: CT HEAD WITHOUT CONTRAST TECHNIQUE: Contiguous axial images were obtained from the base of the skull through the vertex without intravenous contrast. COMPARISON:  Prior CT from 05/28/2017. FINDINGS: Brain: Examination limited by positioning and motion. Age-related cerebral atrophy with chronic microvascular ischemic disease. Remote posterior left MCA territory infarct. Associated laminar necrosis, similar to previous. No acute intracranial hemorrhage. No evidence for acute large vessel territory infarct. No mass lesion or midline shift. No mass effect. No hydrocephalus. No extra-axial fluid collection. Vascular: No hyperdense vessel. Scattered vascular calcifications noted within the carotid siphons. Skull: Multifocal frontal scalp contusions.  Calvarium intact. Sinuses/Orbits: Globes normal  soft tissues within normal limits. Visualized paranasal sinuses are clear. Small bilateral mastoid effusions, left greater than right noted. Other: None. ASPECTS [facility] Stroke Program Early CT Score) - Ganglionic level infarction (caudate, lentiform nuclei, internal capsule, insula, M1-M3 cortex): 7 - Supraganglionic infarction (M4-M6 cortex): 3 Total score (0-10 with 10 being normal): 10 IMPRESSION: 1. No acute intracranial abnormality. 2. ASPECTS is 10 3. Multifocal soft tissue contusions about the forehead. Calvarium intact. 4. Stable atrophy with chronic microvascular ischemic disease. Remote left MCA territory infarct with associated laminar necrosis. Critical Value/emergent results were called by telephone at the time of interpretation on 06/04/2017 at 8:41 pm to Dr. Lorraine Lax, who verbally acknowledged these results. Electronically Signed   By: Jeannine Boga M.D.   On: 06/04/2017 20:43    Time Spent in minutes  30   Jani Gravel M.D on 06/10/2017 at 6:15 AM  Between 7am to 7pm - Pager - [phone]  After 7pm go to [email or web address] - password [facility]  [facility] -  Office  [phone]

## 2017-06-10 NOTE — Progress Notes (Signed)
ANTICOAGULATION CONSULT NOTE   Pharmacy Consult for apixaban (from enoxaparin) Indication: atrial fibrillation  No Known Allergies  Patient Measurements: Height:  (167.6 cm) Weight: 143 lb 1.3 oz (64.9 kg) IBW/kg (Calculated) : 59.3  Vital Signs: Temp: 98 F (36.7 C) (09/18 0300) Temp Source: Axillary (09/18 0300) BP: 95/70 (09/18 0500) Pulse Rate: 84 (09/18 0100)  Labs:  Recent Labs  06/07/17 0730  06/08/17 0329 06/09/17 0357 06/09/17 0747 06/10/17 0534  HGB  --   < > 11.1* 11.3*  --  11.5*  HCT  --   --  34.5* 33.9*  --  34.4*  PLT  --   --  130* 140*  --  169  CREATININE  --   --  0.94  --  1.00 1.14*  TROPONINI 0.04*  --   --   --   --   --   < > = values in this interval not displayed.  Estimated Creatinine Clearance: 35.6 mL/min (A) (by C-G formula based on SCr of 1.14 mg/dL (H)).   Medical History: Past Medical History:  Diagnosis Date  . CAD (coronary artery disease)    a. Possible dx -  Lexiscan myoview (9/11): EF 76%, normal wall motion, possible small anterior MI with mild peri-infarct ischemia but cannot rule out breast attenuation, managed conservatively.  . Cerebrovascular accident, embolic (HCC)    left frontal  . Chronic atrial fibrillation (HCC)    a. Chronic. Has had cardioembolic right renal infarct in 12/06 and cardioembolic event to the right arm requiring right brachial embolectomy. Both events occurred while subtherapeutic on coumadin. b. Slow VR 12/2013.  . Diastolic HF (heart failure) (HCC)    Echo (7/11): EF 60-65%, moderate LVH, severe diastolic dysfunction.   . Dyslipidemia   . HTN (hypertension)    a. Resistant HTN. Patient states that this has been poorly controlled for years.   . Iron deficiency anemia   . Memory loss   . Obese   . PAD (peripheral artery disease) (HCC)    a. peripheral arterial dopplers (5/12) with right mid popliteal artery occlusion with reconstitution of arteries below the knees by collaterals. Only mild  reduction in ABI on the right (not tissue-threatening).    Medications:  Scheduled:  . atorvastatin  80 mg Oral q1800  . diltiazem  30 mg Oral Q4H  . feeding supplement (ENSURE ENLIVE)  237 mL Oral BID BM  . mouth rinse  15 mL Mouth Rinse BID  . metoprolol tartrate  25 mg Oral BID   Infusions:   PRN: acetaminophen, haloperidol lactate, ondansetron (ZOFRAN) IV  Assessment: 83 y.o. female with medical history significant of hypertension, hyperlipidemia, PVD, dCHF, stroke, CAD, dementia, CKD-III, who presents with generalized weakness, worsening mental status and possible fall.   Patient has a history of atrial fibrillation on xarelto  daily which has been on hold since admission d/t fall with hematomas on head. CT head is negative for acute intracranial abnormalities. MRI brain on 9/15 showed evidence of old CVA but no new CVA. CHA2DS2-VASc Scoreis 8, needs oral anticoagulation. Because of the drug-drug interaction between Xarelto and diltiazem (increased exposure to Xarelto due to inhibition of metabolism by diltiazem), will change to Eliquis.  Last dose of enoxaparin was 9/17 at 2200  Goal of Therapy:  Therapeutic anticoagulation, prevention of thrombosis Monitor platelets by anticoagulation protocol: Yes   Plan:  Eliquis  PO bid Continue to follow renal function and signs/symptoms of bleeding and need to adjust dose Monitor weight -  if drops < 60kg, will need to adjust dose to 2.5mg  bid  Juliette Alcide, PharmD, BCPS.   Pager: [phone] 06/10/2017 6:31 AM

## 2017-06-11 LAB — GLUCOSE, CAPILLARY
GLUCOSE-CAPILLARY: 77 mg/dL (ref 65–99)
GLUCOSE-CAPILLARY: 49 mg/dL — AB (ref 65–99)
GLUCOSE-CAPILLARY: 89 mg/dL (ref 65–99)
Glucose-Capillary: 64 mg/dL — ABNORMAL LOW (ref 65–99)
Glucose-Capillary: 97 mg/dL (ref 65–99)

## 2017-06-11 LAB — BASIC METABOLIC PANEL
ANION GAP: 8 (ref 5–15)
BUN: 18 mg/dL (ref 6–20)
CHLORIDE: 103 mmol/L (ref 101–111)
CO2: 25 mmol/L (ref 22–32)
Calcium: 8.5 mg/dL — ABNORMAL LOW (ref 8.9–10.3)
Creatinine, Ser: 1.3 mg/dL — ABNORMAL HIGH (ref 0.44–1.00)
GFR calc Af Amer: 43 mL/min — ABNORMAL LOW (ref >60)
GFR, EST NON AFRICAN AMERICAN: 37 mL/min — AB (ref >60)
GLUCOSE: 80 mg/dL (ref 65–99)
POTASSIUM: 4.2 mmol/L (ref 3.5–5.1)
Sodium: 136 mmol/L (ref 135–145)

## 2017-06-11 LAB — CBC
HEMATOCRIT: 34.1 % — AB (ref 36.0–46.0)
HEMOGLOBIN: 11.1 g/dL — AB (ref 12.0–15.0)
MCH: 29 pg (ref 26.0–34.0)
MCHC: 32.6 g/dL (ref 30.0–36.0)
MCV: 89 fL (ref 78.0–100.0)
Platelets: 196 10*3/uL (ref 150–400)
RBC: 3.83 MIL/uL — ABNORMAL LOW (ref 3.87–5.11)
RDW: 14.6 % (ref 11.5–15.5)
WBC: 3.9 10*3/uL — AB (ref 4.0–10.5)

## 2017-06-11 MED ORDER — DILTIAZEM HCL 30 MG PO TABS: 30.0000 mg | ORAL_TABLET | ORAL | 3 refills | Status: AC

## 2017-06-11 MED ORDER — METOPROLOL TARTRATE 25 MG PO TABS
25.0000 mg | ORAL_TABLET | Freq: Two times a day (BID) | ORAL | 2 refills | Status: AC
Start: 2017-06-11 — End: ?

## 2017-06-11 MED ORDER — APIXABAN 5 MG PO TABS: 5.0000 mg | ORAL_TABLET | Freq: Two times a day (BID) | ORAL | 0 refills | Status: AC

## 2017-06-11 MED ORDER — ENSURE ENLIVE PO LIQD: 237.0000 mL | Freq: Two times a day (BID) | ORAL | 12 refills | Status: AC

## 2017-06-11 MED ORDER — DEXTROSE 50 % IV SOLN
INTRAVENOUS | Status: AC
  Administered 2017-06-11: 50 mL
  Filled 2017-06-11: qty 50

## 2017-06-11 NOTE — Progress Notes (Signed)
Received to room 1403.

## 2017-06-11 NOTE — Care Management Important Message (Signed)
Important Message  Patient Details  Name: [name] MRN: [id number] Date of Birth: [birth year]   Medicare Important Message Given:  Yes    Caren Macadam 06/11/2017, 10:32 AMImportant Message  Patient Details  Name: [name] MRN: [id number] Date of Birth: 1932/10/29   Medicare Important Message Given:  Yes    Caren Macadam 06/11/2017, 10:32 AM

## 2017-06-11 NOTE — Progress Notes (Signed)
CSW contacted [facility] and spoke with staff member Marylene Land who confirmed patient's bed offer. CSW spoke with patient's daughter [name] and confirmed plan for patient to dc to [facility]. CSW will continue to follow and assist with dc planning.   Celso Sickle, [place] Clinical Social Worker [facility] Cell#: [phone]

## 2017-06-11 NOTE — Discharge Summary (Signed)
Physician Discharge Summary  [name]:[id number] DOB: [birth year] DOA: 06/06/2017  PCP: System, Provider Not In  Admit date: 06/06/2017 Discharge date: 06/11/2017  Admitted From:Home Disposition:  [facility]  Recommendations for Outpatient Follow-up:  1. Follow up with PCP in 1-2 weeks 2. Please obtain BMP/CBC in one week  Home Health:NO Equipment/Devices: 4L [place]  Discharge Condition:Stable CODE STATUS:Full Diet recommendation: Dysphagia   Brief/Interim Summary: This is a 83 y.o.femalewith medical history significant of hypertension, hyperlipidemia, PVD, dCHF, atrial fibrillation on xarelto,stroke, CAD, dementia, SKD-III, who presented on 06/06/2017 with generalized weakness, worsening mental status and possible fall. (Patient was recently hospitalized from 8/5-8/9 for possible MCA stroke. Pt was unable to do MRI due to patient not being able to cooperate,but repeatedCT showed loss of gray-white differentiation along the left posterior insular ribbon is concerning for progressive left MCA territory infarct. She was on Xarelto for A fib and is now on Eliquis due to initiation of Cardizem.)  She was noted to be quite confused and disorientated on initial admission and was noted to have atrial fibrillation with RVR with approximately 140 bpm. Her blood pressure was noted to be elevated and repeat CT of the head did not demonstrate any acute intracranial abnormalities. She was admitted to the stepdown unit where Cardizem drip was initiated for heart rate control. While there, she was noted to have some significant dysphagia and family members were ultimately contacted regarding PEG tube placement, but had declined. She is currently on a dysphagia diet which she appears to be tolerating satisfactorily.  She has been placed on oral metoprolol as well as oral Cardizem and has been started on anticoagulation with Eliquis. She has been transferred to telemetry earlier today  and does have a skilled nursing facility bed available. She is currently stable for discharge and needs follow up with her primary care provider at this facility.  Discharge Diagnoses:  Principal Problem:   Atrial fibrillation with RVR (HCC) Active Problems:   HTN (hypertension)   Chronic diastolic heart failure (HCC)   Long term (current) use of anticoagulants   Acute metabolic encephalopathy   Dementia with behavioral disturbance   Pure hypercholesterolemia   Stroke [facility])   Fall   CKD (chronic kidney disease), stage III    Discharge Instructions  Discharge Instructions    Diet - low sodium heart healthy    Complete by:  As directed    Increase activity slowly    Complete by:  As directed      Allergies as of 06/11/2017   No Known Allergies     Medication List    STOP taking these medications   Rivaroxaban 15 MG Tabs tablet Commonly known as:  XARELTO   sodium chloride 0.65 % Soln nasal spray Commonly known as:  OCEAN     TAKE these medications   acetaminophen 650 MG CR tablet Commonly known as:  TYLENOL Take 650 mg by mouth every 4 (four) hours as needed (for pain).   apixaban 5 MG Tabs tablet Commonly known as:  ELIQUIS Take 1 tablet (5 mg total) by mouth 2 (two) times daily.   atorvastatin 80 MG tablet Commonly known as:  LIPITOR TAKE 1 TABLET BY MOUTH DAILY AT 6 PM. What changed:  how much to take  how to take this  when to take this  additional instructions   citalopram 10 MG tablet Commonly known as:  CELEXA Take 10 mg by mouth daily.   cyanocobalamin 500 MCG tablet Take  500 mcg by mouth daily.   diltiazem 30 MG tablet Commonly known as:  CARDIZEM Take 1 tablet (30 mg total) by mouth every 4 (four) hours.   donepezil 10 MG tablet Commonly known as:  ARICEPT Take 1 tablet (10 mg total) by mouth at bedtime.   feeding supplement (ENSURE ENLIVE) Liqd Take 237 mLs by mouth 2 (two) times daily between meals.   haloperidol 2 MG/ML  solution Commonly known as:  HALDOL Take 1 mL (2 mg total) by mouth every 6 (six) hours as needed for agitation.   metoprolol tartrate 25 MG tablet Commonly known as:  LOPRESSOR Take 1 tablet (25 mg total) by mouth 2 (two) times daily. What changed:  how much to take   traMADol 50 MG tablet Commonly known as:  ULTRAM Take 50 mg by mouth every 12 (twelve) hours as needed (for pain not first relieved by Tylenol).            Discharge Care Instructions        Start     Ordered   06/11/17 0000  apixaban (ELIQUIS) 5 MG TABS tablet  2 times daily     06/11/17 1320   06/11/17 0000  diltiazem (CARDIZEM) 30 MG tablet  Every 4 hours     06/11/17 1320   06/11/17 0000  feeding supplement, ENSURE ENLIVE, (ENSURE ENLIVE) LIQD  2 times daily between meals     06/11/17 1320   06/11/17 0000  metoprolol tartrate (LOPRESSOR) 25 MG tablet  2 times daily     06/11/17 1320   06/11/17 0000  Increase activity slowly     06/11/17 1320   06/11/17 0000  Diet - low sodium heart healthy     06/11/17 1320      Contact information for follow-up providers    System, Provider Not In Follow up.        PCP at facility Follow up in 1 week(s).            Contact information for after-discharge care    Destination    [facility] .   Specialty:  Skilled Nursing Facility Contact information: [address] [place]. [place] [address] [phone]                 No Known Allergies  Consultations:  None   Procedures/Studies: Dg Chest 2 View  Result Date: 06/06/2017 CLINICAL DATA:  History of dementia with to hematoma is noted on head lying in bed not wanting to get up lying in urine and feces. EXAM: CHEST  2 VIEW COMPARISON:  04/27/2017 and 04/14/2015 FINDINGS: Lungs are adequately inflated without consolidation or effusion. Mild stable cardiomegaly. Minimal calcified plaque over the aortic arch. Moderate degenerate change of the spine. IMPRESSION: No acute  cardiopulmonary disease. Aortic Atherosclerosis (ICD10-I70.0). Electronically Signed   By: Elberta Fortis M.D.   On: 06/06/2017 13:06   Ct Head Wo Contrast  Result Date: 06/06/2017 CLINICAL DATA:  Altered level of consciousness. EXAM: CT HEAD WITHOUT CONTRAST TECHNIQUE: Contiguous axial images were obtained from the base of the skull through the vertex without intravenous contrast. COMPARISON:  CT scan of June 04, 2017. FINDINGS: Brain: Mild chronic ischemic white matter disease is noted. Stable old left MCA infarction is noted. No mass effect or midline shift is noted. Ventricular size is within normal limits. There is no evidence of mass lesion, hemorrhage or acute infarction. Vascular: No hyperdense vessel or unexpected calcification. Skull: Normal. Negative for fracture or  focal lesion. Sinuses/Orbits: No acute finding. Other: Stable mild bilateral frontal scalp hematomas are noted. IMPRESSION: Stable mild bilateral frontal scalp hematomas. Mild chronic ischemic white matter disease. Stable old left MCA infarction. No acute intracranial abnormality seen. Electronically Signed   By: Lupita Raider, M.D.   On: 06/06/2017 13:40   Ct Head Wo Contrast  Result Date: 05/28/2017 CLINICAL DATA:  Fall. Posterior neck pain. Head trauma, minor, patient on anti coagulation. EXAM: CT HEAD WITHOUT CONTRAST CT CERVICAL SPINE WITHOUT CONTRAST TECHNIQUE: Multidetector CT imaging of the head and cervical spine was performed following the standard protocol without intravenous contrast. Multiplanar CT image reconstructions of the cervical spine were also generated. COMPARISON:  CT head without contrast 04/28/2017. FINDINGS: CT HEAD FINDINGS Brain: Moderate atrophy and white matter changes are stable. Previous left parietal lobe infarct is stable. Previously suspected posterior left insular infarct is not present. Remote ischemic changes of the basal ganglia are stable. The brainstem and cerebellum are normal. Ventricles  are proportionate to the degree of atrophy. No significant extra-axial fluid collection is present. Vascular: Atherosclerotic calcifications are present within the cavernous internal carotid artery is bilaterally. There is no hyperdense vessel. Skull: A prominent right frontal scalp laceration and hematoma is present. There is no underlying fracture. Hyperostosis is again noted. Sinuses/Orbits: The paranasal sinuses and mastoid air cells are clear. CT CERVICAL SPINE FINDINGS Alignment: Slight anterolisthesis at C2-3 is stable. There straightening of the normal cervical lordosis. Grade 1 anterolisthesis at C7-T1 is stable. AP alignment is unchanged. Skull base and vertebrae: The craniocervical junction is normal. Degenerative changes at C1-2 or stable. A remote left T7 laminar fracture is stable. No acute fractures are present. Soft tissues and spinal canal: Atherosclerotic calcifications are present at carotid bifurcations bilaterally. Right thyroid goiter is stable. No significant cervical adenopathy is present. Disc levels: Left-sided osseous foraminal narrowing is most pronounced at C4-5 and C5-6. More moderate disease is present at C6-7 on the left. Right-sided foramina are patent. Upper chest: The lung apices are clear. IMPRESSION: 1. Stable remote left C7 lamina fracture. 2. No acute cervical spine fracture. 3. Large right frontal scalp hematoma and laceration without underlying fracture. 4. Stable atrophy and white matter disease. 5. Remote left parietal lobe infarct is stable. Electronically Signed   By: Marin Roberts M.D.   On: 05/28/2017 15:21   Ct Cervical Spine Wo Contrast  Result Date: 05/28/2017 CLINICAL DATA:  Fall. Posterior neck pain. Head trauma, minor, patient on anti coagulation. EXAM: CT HEAD WITHOUT CONTRAST CT CERVICAL SPINE WITHOUT CONTRAST TECHNIQUE: Multidetector CT imaging of the head and cervical spine was performed following the standard protocol without intravenous contrast.  Multiplanar CT image reconstructions of the cervical spine were also generated. COMPARISON:  CT head without contrast 04/28/2017. FINDINGS: CT HEAD FINDINGS Brain: Moderate atrophy and white matter changes are stable. Previous left parietal lobe infarct is stable. Previously suspected posterior left insular infarct is not present. Remote ischemic changes of the basal ganglia are stable. The brainstem and cerebellum are normal. Ventricles are proportionate to the degree of atrophy. No significant extra-axial fluid collection is present. Vascular: Atherosclerotic calcifications are present within the cavernous internal carotid artery is bilaterally. There is no hyperdense vessel. Skull: A prominent right frontal scalp laceration and hematoma is present. There is no underlying fracture. Hyperostosis is again noted. Sinuses/Orbits: The paranasal sinuses and mastoid air cells are clear. CT CERVICAL SPINE FINDINGS Alignment: Slight anterolisthesis at C2-3 is stable. There straightening of the normal cervical lordosis. Grade  1 anterolisthesis at C7-T1 is stable. AP alignment is unchanged. Skull base and vertebrae: The craniocervical junction is normal. Degenerative changes at C1-2 or stable. A remote left T7 laminar fracture is stable. No acute fractures are present. Soft tissues and spinal canal: Atherosclerotic calcifications are present at carotid bifurcations bilaterally. Right thyroid goiter is stable. No significant cervical adenopathy is present. Disc levels: Left-sided osseous foraminal narrowing is most pronounced at C4-5 and C5-6. More moderate disease is present at C6-7 on the left. Right-sided foramina are patent. Upper chest: The lung apices are clear. IMPRESSION: 1. Stable remote left C7 lamina fracture. 2. No acute cervical spine fracture. 3. Large right frontal scalp hematoma and laceration without underlying fracture. 4. Stable atrophy and white matter disease. 5. Remote left parietal lobe infarct is  stable. Electronically Signed   By: Marin Roberts M.D.   On: 05/28/2017 15:21   Mr Brain Wo Contrast  Result Date: 06/07/2017 CLINICAL DATA:  Altered mental status EXAM: MRI HEAD WITHOUT CONTRAST TECHNIQUE: Multiplanar, multiecho pulse sequences of the brain and surrounding structures were obtained without intravenous contrast. COMPARISON:  Head CT 06/06/2017 FINDINGS: The examination had to be discontinued prior to completion due to the patient's altered mental status. She was combative and unable to cooperate with the technologist's instructions. Only axial and coronal diffusion-weighted imaging could be obtained. Corresponding ADC maps were generated. There is elevated diffusion weighted signal intensity within the posterior left MCA distribution, corresponding to the site of old infarct. There is no clear diffusion restriction visible on the ADC map, though the images are degraded by motion. There is no midline shift or other mass effect. Unchanged size and configuration of the ventricles. IMPRESSION: 1. Truncated and motion limited examination. Only DWI/ADC images were obtained. 2. Old posterior left MCA distribution infarct without any clear area of acute ischemia. However, sensitivity is limited by the degree of patient motion during the scan. DWI abnormalities in the posterior left MCA distribution are favored to be secondary to T2 shine through at the site of remote infarct. Electronically Signed   By: Deatra Robinson M.D.   On: 06/07/2017 14:11   Ct Head Code Stroke Wo Contrast  Result Date: 06/04/2017 CLINICAL DATA:  Code stroke. Initial evaluation for acute left facial droop. Fall. EXAM: CT HEAD WITHOUT CONTRAST TECHNIQUE: Contiguous axial images were obtained from the base of the skull through the vertex without intravenous contrast. COMPARISON:  Prior CT from 05/28/2017. FINDINGS: Brain: Examination limited by positioning and motion. Age-related cerebral atrophy with chronic microvascular  ischemic disease. Remote posterior left MCA territory infarct. Associated laminar necrosis, similar to previous. No acute intracranial hemorrhage. No evidence for acute large vessel territory infarct. No mass lesion or midline shift. No mass effect. No hydrocephalus. No extra-axial fluid collection. Vascular: No hyperdense vessel. Scattered vascular calcifications noted within the carotid siphons. Skull: Multifocal frontal scalp contusions.  Calvarium intact. Sinuses/Orbits: Globes normal soft tissues within normal limits. Visualized paranasal sinuses are clear. Small bilateral mastoid effusions, left greater than right noted. Other: None. ASPECTS [facility] Stroke Program Early CT Score) - Ganglionic level infarction (caudate, lentiform nuclei, internal capsule, insula, M1-M3 cortex): 7 - Supraganglionic infarction (M4-M6 cortex): 3 Total score (0-10 with 10 being normal): 10 IMPRESSION: 1. No acute intracranial abnormality. 2. ASPECTS is 10 3. Multifocal soft tissue contusions about the forehead. Calvarium intact. 4. Stable atrophy with chronic microvascular ischemic disease. Remote left MCA territory infarct with associated laminar necrosis. Critical Value/emergent results were called by telephone at the time  of interpretation on 06/04/2017 at 8:41 pm to Dr. Laurence Slate, who verbally acknowledged these results. Electronically Signed   By: Rise Mu M.D.   On: 06/04/2017 20:43   (Echo, Carotid, EGD, Colonoscopy, ERCP)    Subjective:   Discharge Exam: Vitals:   06/11/17 1159 06/11/17 1407  BP: 126/85 136/75  Pulse: 65 81  Resp: 18 18  Temp: 98.1 F (36.7 C) 97.9 F (36.6 C)  SpO2: 97%    Vitals:   06/11/17 0730 06/11/17 0939 06/11/17 1159 06/11/17 1407  BP: 121/83 108/76 126/85 136/75  Pulse:  69 65 81  Resp:  Temp: 98.2 F (36.8 C) 99.2 F (37.3 C) 98.1 F (36.7 C) 97.9 F (36.6 C)  TempSrc: Axillary Oral Axillary Axillary  SpO2: 94% 100% 97%   Weight:      Height:         General: Pt is alert, awake, not in acute distress Cardiovascular: RRR, S1/S2 +, no rubs, no gallops Respiratory: CTA bilaterally, no wheezing, no rhonchi Abdominal: Soft, NT, ND, bowel sounds + Extremities: no edema, no cyanosis    The results of significant diagnostics from this hospitalization (including imaging, microbiology, ancillary and laboratory) are listed below for reference.     Microbiology: Recent Results (from the past 240 hour(s))  MRSA PCR Screening     Status: None   Collection Time: 06/07/17 12:23 AM  Result Value Ref Range Status   MRSA by PCR NEGATIVE NEGATIVE Final    Comment:        The GeneXpert MRSA Assay (FDA approved for NASAL specimens only), is one component of a comprehensive MRSA colonization surveillance program. It is not intended to diagnose MRSA infection nor to guide or monitor treatment for MRSA infections.   Urine Culture     Status: Abnormal   Collection Time: 06/07/17  4:37 AM  Result Value Ref Range Status   Specimen Description URINE, RANDOM  Final   Special Requests NONE  Final   Culture 80,000 COLONIES/mL ESCHERICHIA COLI (A)  Final   Report Status 06/09/2017 FINAL  Final   Organism ID, Bacteria ESCHERICHIA COLI (A)  Final      Susceptibility   Escherichia coli - MIC*    AMPICILLIN <=2 SENSITIVE Sensitive     CEFAZOLIN <=4 SENSITIVE Sensitive     CEFTRIAXONE <=1 SENSITIVE Sensitive     CIPROFLOXACIN <=0.25 SENSITIVE Sensitive     GENTAMICIN <=1 SENSITIVE Sensitive     IMIPENEM <=0.25 SENSITIVE Sensitive     NITROFURANTOIN <=16 SENSITIVE Sensitive     TRIMETH/SULFA <=20 SENSITIVE Sensitive     AMPICILLIN/SULBACTAM <=2 SENSITIVE Sensitive     PIP/TAZO <=4 SENSITIVE Sensitive     Extended ESBL NEGATIVE Sensitive     * 80,000 COLONIES/mL ESCHERICHIA COLI     Labs: BNP (last 3 results)  Recent Labs  06/06/17 2147  BNP 276.5*   Basic Metabolic Panel:  Recent Labs Lab 06/07/17 0351 06/08/17 0329 06/09/17 0747  06/10/17 0534 06/11/17 0328  NA 140 136 134* 132* 136  K 3.8 3.9 3.4* 3.2* 4.2  CL 106 105 102 100* 103  CO2 GLUCOSE 91 90 94 96 80  BUN CREATININE 1.06* 0.94 1.00 1.14* 1.30*  CALCIUM 8.8* 8.3* 8.4* 8.1* 8.5*   Liver Function Tests:  Recent Labs Lab 06/04/17 2017 06/06/17 1224 06/08/17 0329 06/09/17 0747 06/10/17 0534  AST 41 39 29 30 40  ALT  ALKPHOS 78 82 63 74 76  BILITOT 0.9 1.6* 1.8* 1.6* 1.5*  PROT 6.7 7.1 5.8* 6.1* 5.8*  ALBUMIN 2.9* 3.2* 2.3* 2.4* 2.3*   No results for input(s): LIPASE, AMYLASE in the last 168 hours. No results for input(s): AMMONIA in the last 168 hours. CBC:  Recent Labs Lab 06/04/17 2017  06/07/17 0351 06/08/17 0329 06/09/17 0357 06/10/17 0534 06/11/17 0328  WBC 3.0*  < > 3.4* 3.3* 3.0* 3.1* 3.9*  NEUTROABS 1.7  --   --   --   --   --   --   HGB 13.0  < > 11.2* 11.1* 11.3* 11.5* 11.1*  HCT 41.3  < > 34.4* 34.5* 33.9* 34.4* 34.1*  MCV 91.2  < > 90.1 88.9 89.2 87.5 89.0  PLT 121*  < > 139* 130* 140* 169 196  < > = values in this interval not displayed. Cardiac Enzymes:  Recent Labs Lab 06/06/17 2147 06/07/17 0143 06/07/17 0730  CKTOTAL 130  --   --   TROPONINI 0.04* 0.04* 0.04*   BNP: Invalid input(s): POCBNP CBG:  Recent Labs Lab 06/10/17 2138 06/11/17 0745 06/11/17 0824 06/11/17 1132 06/11/17 1202  GLUCAP 76 64* 77 49* 97   D-Dimer No results for input(s): DDIMER in the last 72 hours. Hgb A1c No results for input(s): HGBA1C in the last 72 hours. Lipid Profile No results for input(s): CHOL, HDL, LDLCALC, TRIG, CHOLHDL, LDLDIRECT in the last 72 hours. Thyroid function studies No results for input(s): TSH, T4TOTAL, T3FREE, THYROIDAB in the last 72 hours.  Invalid input(s): FREET3 Anemia work up No results for input(s): VITAMINB12, FOLATE, FERRITIN, TIBC, IRON, RETICCTPCT in the last 72 hours. Urinalysis    Component Value Date/Time   COLORURINE YELLOW  06/07/2017 0437   APPEARANCEUR CLEAR 06/07/2017 0437   LABSPEC 1.010 06/07/2017 0437   PHURINE 6.0 06/07/2017 0437   GLUCOSEU NEGATIVE 06/07/2017 0437   HGBUR SMALL (A) 06/07/2017 0437   BILIRUBINUR NEGATIVE 06/07/2017 0437   BILIRUBINUR small 07/29/2016 1103   KETONESUR NEGATIVE 06/07/2017 0437   PROTEINUR NEGATIVE 06/07/2017 0437   UROBILINOGEN 1.0 07/29/2016 1103   UROBILINOGEN 1.0 04/14/2015 1030   NITRITE NEGATIVE 06/07/2017 0437   LEUKOCYTESUR NEGATIVE 06/07/2017 0437   Sepsis Labs Invalid input(s): PROCALCITONIN,  WBC,  LACTICIDVEN Microbiology Recent Results (from the past 240 hour(s))  MRSA PCR Screening     Status: None   Collection Time: 06/07/17 12:23 AM  Result Value Ref Range Status   MRSA by PCR NEGATIVE NEGATIVE Final    Comment:        The GeneXpert MRSA Assay (FDA approved for NASAL specimens only), is one component of a comprehensive MRSA colonization surveillance program. It is not intended to diagnose MRSA infection nor to guide or monitor treatment for MRSA infections.   Urine Culture     Status: Abnormal   Collection Time: 06/07/17  4:37 AM  Result Value Ref Range Status   Specimen Description URINE, RANDOM  Final   Special Requests NONE  Final   Culture 80,000 COLONIES/mL ESCHERICHIA COLI (A)  Final   Report Status 06/09/2017 FINAL  Final   Organism ID, Bacteria ESCHERICHIA COLI (A)  Final      Susceptibility   Escherichia coli - MIC*    AMPICILLIN <=2 SENSITIVE Sensitive     CEFAZOLIN <=4 SENSITIVE Sensitive     CEFTRIAXONE <=1 SENSITIVE Sensitive     CIPROFLOXACIN <=0.25 SENSITIVE Sensitive     GENTAMICIN <=  1 SENSITIVE Sensitive     IMIPENEM <=0.25 SENSITIVE Sensitive     NITROFURANTOIN <=16 SENSITIVE Sensitive     TRIMETH/SULFA <=20 SENSITIVE Sensitive     AMPICILLIN/SULBACTAM <=2 SENSITIVE Sensitive     PIP/TAZO <=4 SENSITIVE Sensitive     Extended ESBL NEGATIVE Sensitive     * 80,000 COLONIES/mL ESCHERICHIA COLI     Time  coordinating discharge: Over 30 minutes  SIGNED:   Erick Blinks, MD  [facility] 06/11/2017, 2:43 PM Pager   If 7PM-7AM, please contact night-coverage [email or web address] Password [email or web address]

## 2017-06-11 NOTE — Progress Notes (Signed)
Spoke w/ dtr [name] w/ update.

## 2017-06-11 NOTE — Clinical Social Work Placement (Signed)
Patient received and accepted bed offer at [facility] SN[facility]. Facility aware of patient's discharge and confirmed bed offer. [facility] contacted, patient's family notified. Patient's RN provided with number to call report patient going to room 226A, packet complete. CSW signing off, no other needs identified at this time.   CLINICAL SOCIAL WORK PLACEMENT  NOTE  Date:  06/11/2017  Patient Details  Name: [name] MRN: [id number] Date of Birth: [birth year]  Clinical Social Work is seeking post-discharge placement for this patient at the Skilled  Nursing Facility level of care (*CSW will initial, date and re-position this form in  chart as items are completed):  Yes   Patient/family provided with [place] Clinical Social Work Department's list of facilities offering this level of care within the geographic area requested by the patient (or if unable, by the patient's family).  Yes   Patient/family informed of their freedom to choose among providers that offer the needed level of care, that participate in Medicare, Medicaid or managed care program needed by the patient, have an available bed and are willing to accept the patient.  Yes   Patient/family informed of [place]'s ownership interest in [facility] and [facility], as well as of the fact that they are under no obligation to receive care at these facilities.  PASRR submitted to EDS on       PASRR number received on       Existing PASRR number confirmed on 06/06/17     FL2 transmitted to all facilities in geographic area requested by pt/family on 06/06/17     FL2 transmitted to all facilities within larger geographic area on       Patient informed that his/her managed care company has contracts with or will negotiate with certain facilities, including the following:        Yes   Patient/family informed of bed offers received.  Patient chooses bed at [facility]     Physician recommends and patient  chooses bed at      Patient to be transferred to [facility] on 06/11/17.  Patient to be transferred to facility by [facility]     Patient family notified on 06/11/17 of transfer.  Name of family member notified:  [name]     PHYSICIAN       Additional Comment:    _______________________________________________ Antionette Poles, LCSW 06/11/2017, 4:29 PM

## 2017-06-11 NOTE — Progress Notes (Signed)
Report called to [facility] SN[facility] spoke to [facility] .

## 2017-06-11 NOTE — Progress Notes (Signed)
Made dr Sherryll Burger aware of drop in CBG via text.

## 2017-11-03 IMAGING — CT CT HEAD W/O CM
3 of 4 series · 15 of 47 positions shown, 18 images · non-contrast
Comparison: CT scan of July 17, 2015.

CLINICAL DATA: Altered mental status.

EXAM:
CT HEAD WITHOUT CONTRAST
TECHNIQUE: Contiguous axial images were obtained from the base of the skull
through the vertex without intravenous contrast.

[Series 2: head w/o · axial · non-contrast · 0.45mm/px · z∈[+1702,+1822]mm · 9 of 28 slices shown, 12 images]
[im 2/28  brain]
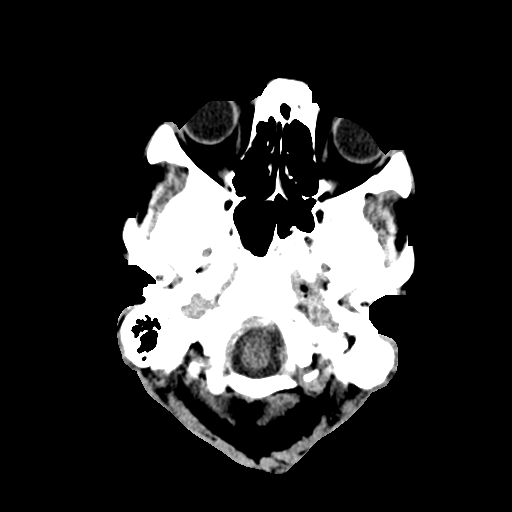
[im 2/28  bone]
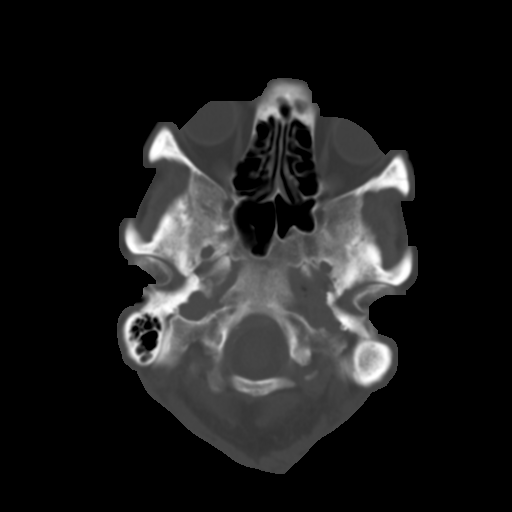
[im 6/28  brain]
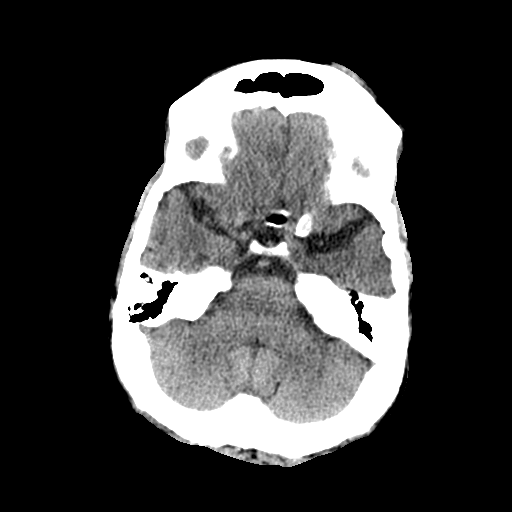
[im 8/28  brain]
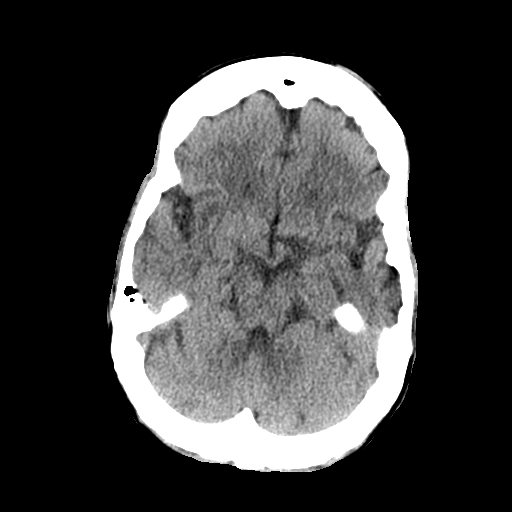
[im 12/28  brain]
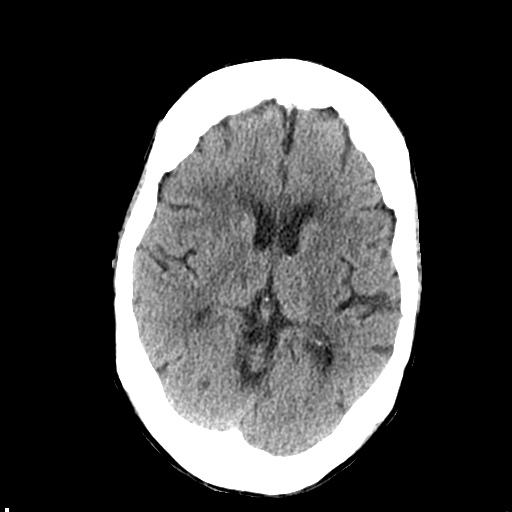
[im 14/28  brain]
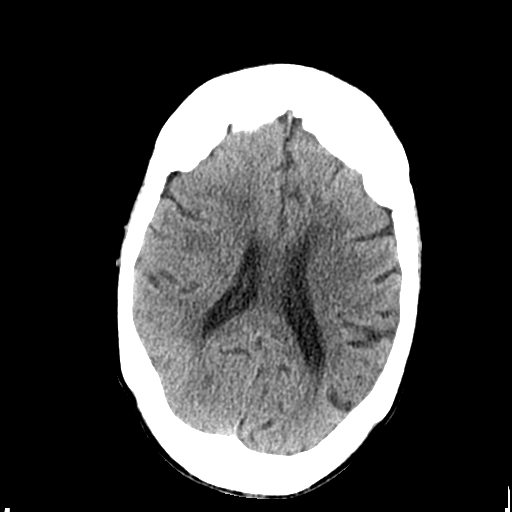
[im 14/28  bone]
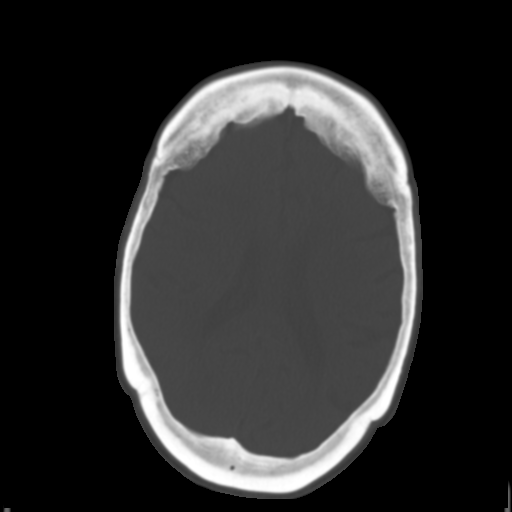
[im 16/28  brain]
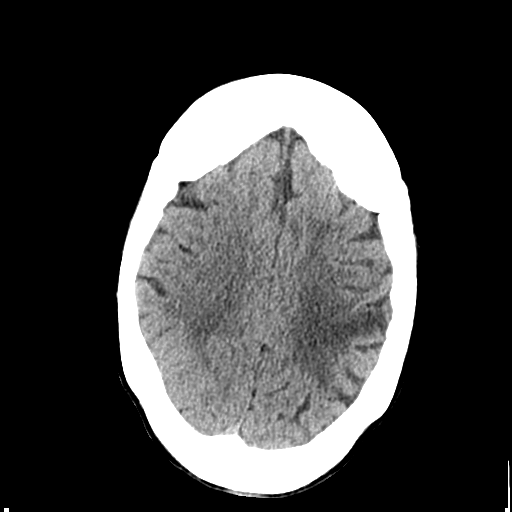
[im 20/28  brain]
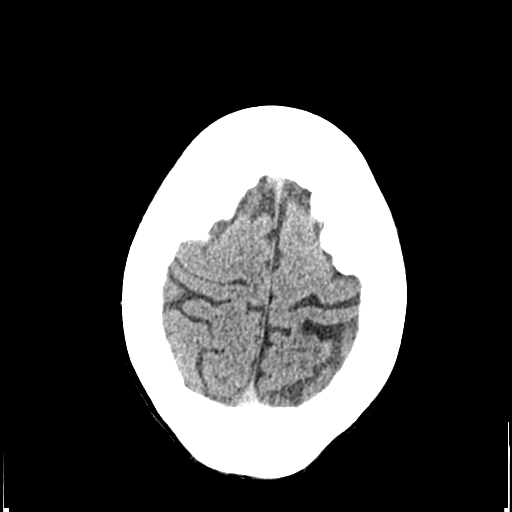
[im 22/28  brain]
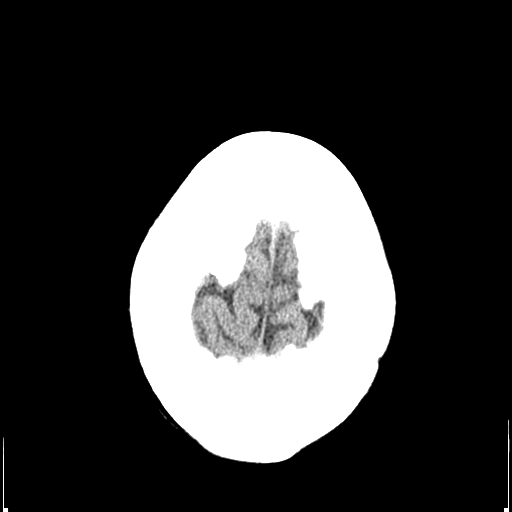
[im 26/28  brain]
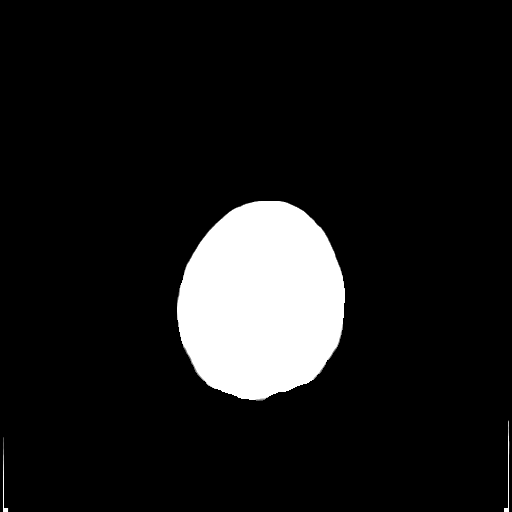
[im 26/28  bone]
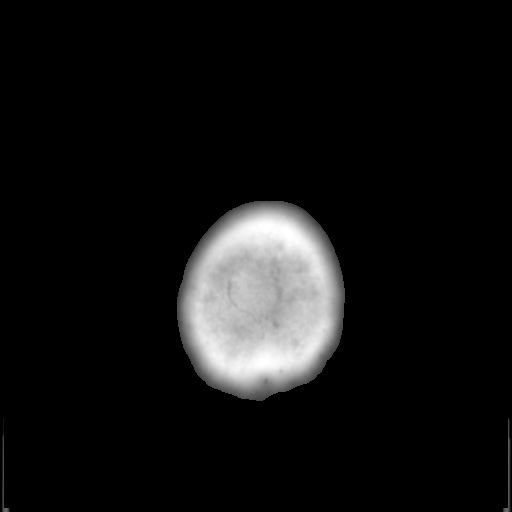

[Series 4: coronal · coronal · 0.28mm/px · 3 of 67 slices shown]
[im 23/67  brain]
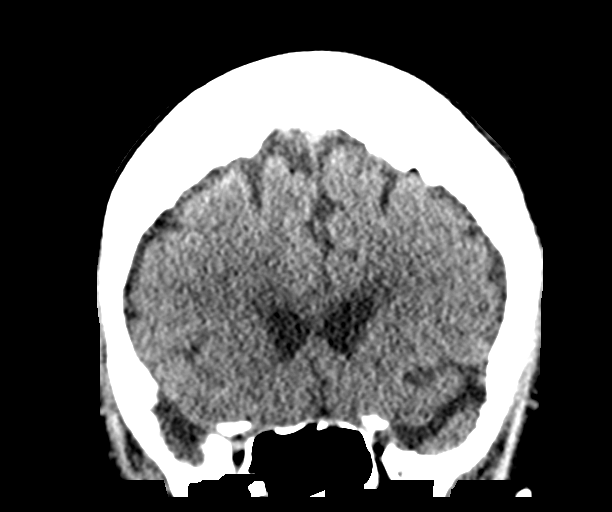
[im 30/67  brain]
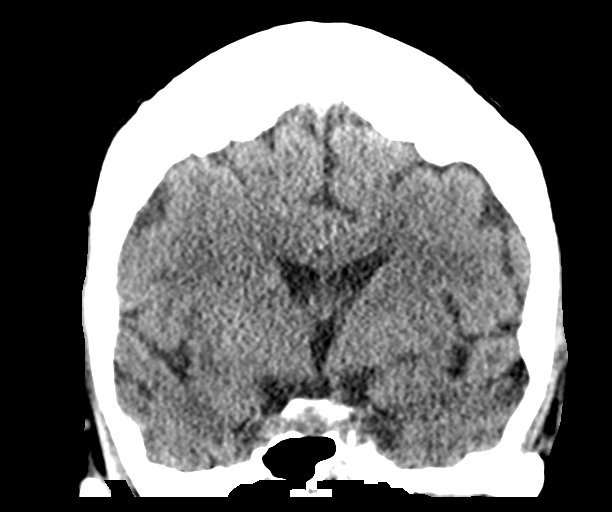
[im 37/67  brain]
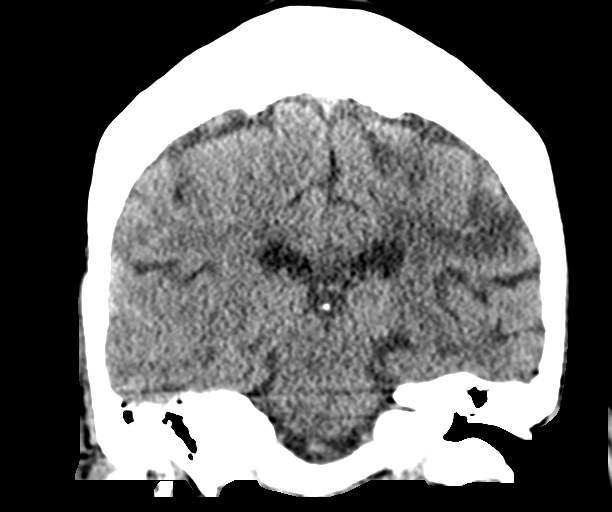

[Series 5: sagittal · sagittal · 0.28mm/px · 3 of 58 slices shown]
[im 20/58  brain]
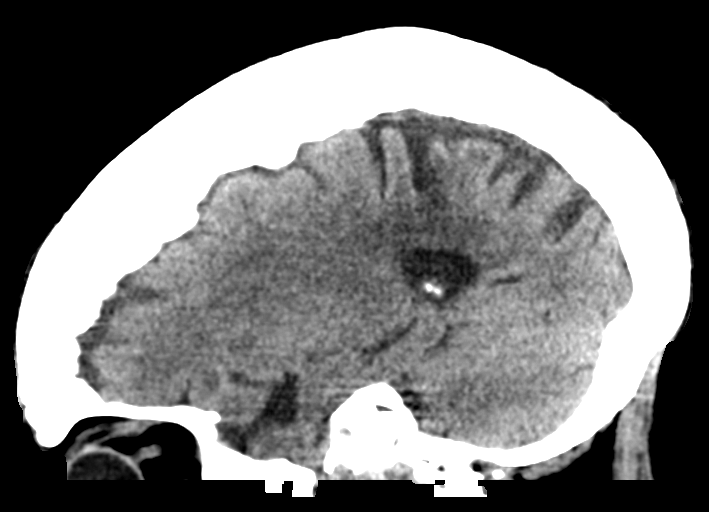
[im 29/58  brain]
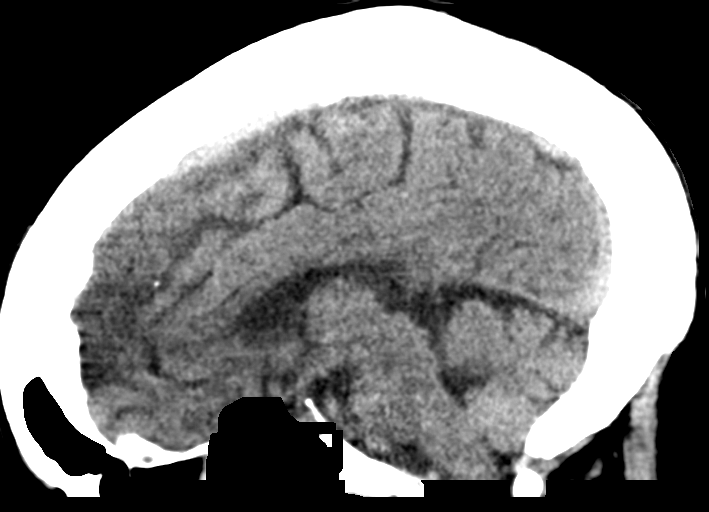
[im 39/58  brain]
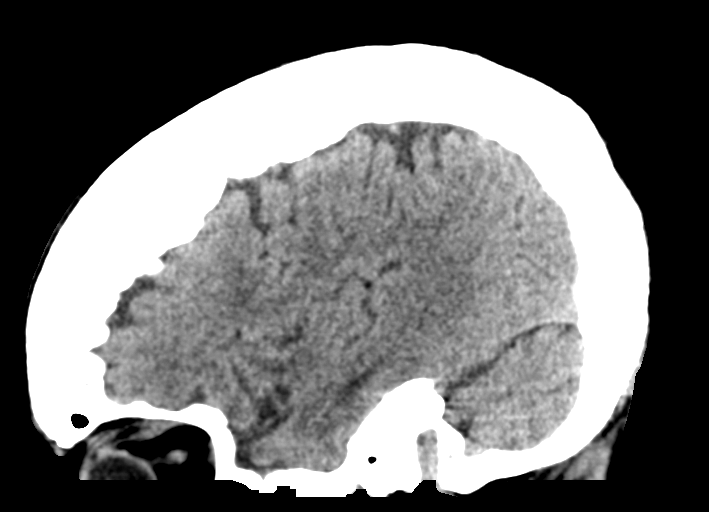

[15 of 47 positions shown; findings below may reference images not displayed]

FINDINGS: Brain: Mild chronic ischemic white matter disease is noted. Old left
parietal infarction is noted. No mass effect or midline shift is
noted. Ventricular size is within normal limits. There is no
evidence of mass lesion, hemorrhage or acute infarction.

Vascular: No hyperdense vessel or unexpected calcification.

Skull: Normal. Negative for fracture or focal lesion.

Sinuses/Orbits: No acute finding.

Other: None.
IMPRESSION: Mild chronic ischemic white matter disease. Old left parietal
infarction. No acute intracranial abnormality seen.
